# Patient Record
Sex: Male | Born: 1945
Health system: Southern US, Community
[De-identification: ages and names within clinical notes are randomized; demographics above are authoritative.]

## PROBLEM LIST (undated history)

## (undated) DIAGNOSIS — T8859XA Other complications of anesthesia, initial encounter: Secondary | ICD-10-CM

## (undated) DIAGNOSIS — I10 Essential (primary) hypertension: Secondary | ICD-10-CM

## (undated) DIAGNOSIS — M1990 Unspecified osteoarthritis, unspecified site: Secondary | ICD-10-CM

## (undated) DIAGNOSIS — R51 Headache: Secondary | ICD-10-CM

## (undated) DIAGNOSIS — E119 Type 2 diabetes mellitus without complications: Secondary | ICD-10-CM

## (undated) DIAGNOSIS — I4891 Unspecified atrial fibrillation: Secondary | ICD-10-CM

## (undated) DIAGNOSIS — K219 Gastro-esophageal reflux disease without esophagitis: Secondary | ICD-10-CM

## (undated) DIAGNOSIS — E785 Hyperlipidemia, unspecified: Secondary | ICD-10-CM

## (undated) DIAGNOSIS — T4145XA Adverse effect of unspecified anesthetic, initial encounter: Secondary | ICD-10-CM

## (undated) DIAGNOSIS — M5430 Sciatica, unspecified side: Secondary | ICD-10-CM

## (undated) DIAGNOSIS — I2510 Atherosclerotic heart disease of native coronary artery without angina pectoris: Secondary | ICD-10-CM

## (undated) DIAGNOSIS — I219 Acute myocardial infarction, unspecified: Secondary | ICD-10-CM

## (undated) HISTORY — DX: Sciatica, unspecified side: M54.30

## (undated) HISTORY — DX: Gastro-esophageal reflux disease without esophagitis: K21.9

## (undated) HISTORY — PX: TONSILLECTOMY: SUR1361

## (undated) HISTORY — PX: CARDIAC CATHETERIZATION: SHX172

## (undated) HISTORY — PX: APPENDECTOMY: SHX54

## (undated) HISTORY — PX: LIPOMA EXCISION: SHX5283

## (undated) HISTORY — DX: Essential (primary) hypertension: I10

## (undated) HISTORY — PX: BAND HEMORRHOIDECTOMY: SHX1213

## (undated) HISTORY — DX: Type 2 diabetes mellitus without complications: E11.9

## (undated) HISTORY — DX: Hyperlipidemia, unspecified: E78.5

## (undated) HISTORY — PX: KNEE SURGERY: SHX244

## (undated) HISTORY — PX: ADENOIDECTOMY: SUR15

## (undated) HISTORY — DX: Atherosclerotic heart disease of native coronary artery without angina pectoris: I25.10

## (undated) HISTORY — PX: COLONOSCOPY: SHX174

## (undated) HISTORY — PX: ESOPHAGEAL DILATION: SHX303

---

## 2000-08-02 ENCOUNTER — Other Ambulatory Visit: Admission: RE | Admit: 2000-08-02 | Discharge: 2000-08-02 | Payer: Self-pay | Admitting: Otolaryngology

## 2002-05-28 HISTORY — PX: CORONARY ANGIOPLASTY: SHX604

## 2002-08-20 ENCOUNTER — Inpatient Hospital Stay (HOSPITAL_COMMUNITY): Admission: EM | Admit: 2002-08-20 | Discharge: 2002-08-25 | Payer: Self-pay | Admitting: Emergency Medicine

## 2002-08-20 ENCOUNTER — Encounter: Payer: Self-pay | Admitting: Emergency Medicine

## 2002-08-21 ENCOUNTER — Encounter: Payer: Self-pay | Admitting: Cardiology

## 2002-11-18 ENCOUNTER — Encounter: Payer: Self-pay | Admitting: Cardiology

## 2002-11-18 ENCOUNTER — Encounter: Admission: RE | Admit: 2002-11-18 | Discharge: 2002-11-18 | Payer: Self-pay | Admitting: Cardiology

## 2002-11-23 ENCOUNTER — Ambulatory Visit (HOSPITAL_COMMUNITY): Admission: RE | Admit: 2002-11-23 | Discharge: 2002-11-23 | Payer: Self-pay | Admitting: Cardiology

## 2002-11-30 ENCOUNTER — Encounter (HOSPITAL_COMMUNITY): Admission: RE | Admit: 2002-11-30 | Discharge: 2003-02-25 | Payer: Self-pay | Admitting: Cardiology

## 2003-05-26 ENCOUNTER — Ambulatory Visit (HOSPITAL_BASED_OUTPATIENT_CLINIC_OR_DEPARTMENT_OTHER): Admission: RE | Admit: 2003-05-26 | Discharge: 2003-05-26 | Payer: Self-pay | Admitting: Orthopedic Surgery

## 2003-06-21 ENCOUNTER — Encounter: Admission: RE | Admit: 2003-06-21 | Discharge: 2003-06-21 | Payer: Self-pay | Admitting: Gastroenterology

## 2003-06-30 ENCOUNTER — Ambulatory Visit (HOSPITAL_COMMUNITY): Admission: RE | Admit: 2003-06-30 | Discharge: 2003-06-30 | Payer: Self-pay | Admitting: Gastroenterology

## 2010-06-18 ENCOUNTER — Encounter: Payer: Self-pay | Admitting: Physical Medicine and Rehabilitation

## 2012-04-21 ENCOUNTER — Ambulatory Visit (INDEPENDENT_AMBULATORY_CARE_PROVIDER_SITE_OTHER): Payer: BC Managed Care – PPO | Admitting: Surgery

## 2012-05-12 ENCOUNTER — Encounter (INDEPENDENT_AMBULATORY_CARE_PROVIDER_SITE_OTHER): Payer: Self-pay

## 2012-05-12 ENCOUNTER — Ambulatory Visit (INDEPENDENT_AMBULATORY_CARE_PROVIDER_SITE_OTHER): Payer: BC Managed Care – PPO | Admitting: Surgery

## 2012-05-12 ENCOUNTER — Encounter (INDEPENDENT_AMBULATORY_CARE_PROVIDER_SITE_OTHER): Payer: Self-pay | Admitting: Surgery

## 2012-05-12 VITALS — BP 144/84 | HR 80 | Temp 97.5°F | Resp 16 | Ht 67.0 in | Wt 190.4 lb

## 2012-05-12 DIAGNOSIS — K621 Rectal polyp: Secondary | ICD-10-CM

## 2012-05-12 DIAGNOSIS — K620 Anal polyp: Secondary | ICD-10-CM

## 2012-05-12 DIAGNOSIS — K649 Unspecified hemorrhoids: Secondary | ICD-10-CM

## 2012-05-12 NOTE — Progress Notes (Signed)
Patient ID: Ryan Wagner, male   DOB: 1946/04/05, 66 y.o.   MRN: [id number]  No chief complaint on file.   HPI Ryan Wagner is a 66 y.o. male.  Patient sent at request of Dr. [name] due to hemorrhoid bleeding. He has a history of thrombosed external hemorrhoids in his 9s. No history of pain. He does have some seepage and bleeding from time to time. Denies constipation or prolong sitting on the commode. HPI  Past Medical History  Diagnosis Date  . CAD (coronary artery disease)   . Hypertension   . Diabetes mellitus without complication   . GERD (gastroesophageal reflux disease)   . Sciatica   . Hyperlipidemia     Past Surgical History  Procedure Date  . Lipoma excision     neck  . Esophageal dilation   . Tonsilectomy, adenoidectomy, bilateral myringotomy and tubes   . Knee surgery   . Cardiac catheterization     No family history on file.  Social History History  Substance Use Topics  . Smoking status: Never Smoker   . Smokeless tobacco: Not on file  . Alcohol Use: Not on file    No Known Allergies  Current Outpatient Prescriptions  Medication Sig Dispense Refill  . amLODipine-benazepril (LOTREL) 10-20 MG per capsule Take 1 capsule by mouth daily.      . ARGININE PO Take by mouth.      [name] aspirin 81 MG tablet Take 81 mg by mouth daily.      [name] atorvastatin (LIPITOR) 40 MG tablet Take 40 mg by mouth daily.      . Coenzyme Q10 (CO Q 10) 100 MG CAPS Take by mouth daily.      . fish oil-omega-3 fatty acids 1000 MG capsule Take 2 g by mouth daily.      . Liraglutide (VICTOZA) 18 MG/3ML SOLN Inject into the skin.      . metFORMIN (GLUCOPHAGE) 500 MG tablet Take 500 mg by mouth 2 (two) times daily with a meal.      . metoprolol (LOPRESSOR) 50 MG tablet Take 50 mg by mouth 2 (two) times daily.      . Multiple Vitamin (MULTIVITAMIN) tablet Take 1 tablet by mouth daily.      . ranitidine (ZANTAC) 150 MG tablet Take 150 mg by mouth 2 (two) times daily.         Review of Systems Review of Systems  Constitutional: Negative for fever, chills and unexpected weight change.  HENT: Negative for hearing loss, congestion, sore throat, trouble swallowing and voice change.   Eyes: Negative for visual disturbance.  Respiratory: Negative for cough and wheezing.   Cardiovascular: Negative for chest pain, palpitations and leg swelling.  Gastrointestinal: Positive for anal bleeding. Negative for nausea, vomiting, abdominal pain, diarrhea, constipation, blood in stool, abdominal distention and rectal pain.  Genitourinary: Negative for hematuria and difficulty urinating.  Musculoskeletal: Negative for arthralgias.  Skin: Negative for rash and wound.  Neurological: Negative for seizures, syncope, weakness and headaches.  Hematological: Negative for adenopathy. Does not bruise/bleed easily.  Psychiatric/Behavioral: Negative for confusion.    Blood pressure 144/84, pulse 80, temperature 97.5 F (36.4 C), resp. rate 16, height '5\' 7"'$  (1.702 m), weight 190 lb 6.4 oz (86.365 kg).  Physical Exam Physical Exam  Constitutional: He is oriented to person, place, and time. He appears well-developed and well-nourished.  HENT:  Head: Normocephalic and atraumatic.  Eyes: EOM are normal. Pupils are equal, round, and reactive to light.  Neck: Normal range of motion. Neck supple.  Cardiovascular: Normal rate and regular rhythm.   Pulmonary/Chest: Effort normal and breath sounds normal.  Genitourinary:     Musculoskeletal: Normal range of motion.  Neurological: He is alert and oriented to person, place, and time.  Skin: Skin is warm and dry.  Psychiatric: He has a normal mood and affect. His behavior is normal. Judgment and thought content normal.      Assessment    Grade 3      two column internal and external hemorrhoids with large polyp    Plan    Recommended excision as an outpatient due to his large bleeding polyp. Risks, benefits and alternative  therapies discussed. Risk of bleeding, infection, stenosis, pain, incontinence, and the need for other procedures discussed. He wishes to proceed. Questions are answered today.       Evelio Rueda A. 05/12/2012, 10:14 AM

## 2012-05-12 NOTE — Patient Instructions (Signed)
Hemorrhoidectomy Hemorrhoidectomy is surgery to remove hemorrhoids. Hemorrhoids are veins that have become swollen in the rectum. The rectum is the area from the bottom end of the intestines to the opening where bowel movements leave the body. Hemorrhoids can be uncomfortable. They can cause itching, bleeding and pain if a blood clot forms in them (thrombose). If hemorrhoids are small, surgery may not be needed. But if they cover a larger area, surgery is usually suggested.  LET YOUR CAREGIVER KNOW ABOUT:  Any allergies. All medications you are taking, including: Herbs, eyedrops, over-the-counter medications and creams. Blood thinners (anticoagulants), aspirin or other drugs that could affect blood clotting. Use of steroids (by mouth or as creams). Previous problems with anesthetics, including local anesthetics. Possibility of pregnancy, if this applies. Any history of blood clots. Any history of bleeding or other blood problems. Previous surgery. Smoking history. Other health problems. RISKS AND COMPLICATIONS All surgery carries some risk. However, hemorrhoid surgery usually goes smoothly. Possible complications could include: Urinary retention. Bleeding. Infection. A painful incision. A reaction to the anesthesia (this is not common). BEFORE THE PROCEDURE  Stop using aspirin and non-steroidal anti-inflammatory drugs (NSAIDs) for pain relief. This includes prescription drugs and over-the-counter drugs such as ibuprofen and naproxen. Also stop taking vitamin E. If possible, do this two weeks before your surgery. If you take blood-thinners, ask your healthcare provider when you should stop taking them. You will probably have blood and urine tests done several days before your surgery. Do not eat or drink for about 8 hours before the surgery. Arrive at least an hour before the surgery, or whenever your surgeon recommends. This will give you time to check in and fill out any needed  paperwork. Hemorrhoidectomy is often an outpatient procedure. This means you will be able to go home the same day. Sometimes, though, people stay overnight in the hospital after the procedure. Ask your surgeon what to expect. Either way, make arrangements in advance for someone to drive you home. PROCEDURE  The preparation: You will change into a hospital gown. You will be given an IV. A needle will be inserted in your arm. Medication can flow directly into your body through this needle. You might be given an enema to clear your rectum. Once in the operating room, you will probably lie on your side or be repositioned later to lying on your stomach. You will be given anesthesia (medication) so you will not feel anything during the surgery. The surgery often is done with local anesthesia (the area near the hemorrhoids will be numb and you will be drowsy but awake). Sometimes, general anesthesia is used (you will be asleep during the procedure). The procedure: There are a few different procedures for hemorrhoids. Be sure to ask you surgeon about the procedure, the risks and benefits. Be sure to ask about what you need to do to take care of the wound, if there is one. AFTER THE PROCEDURE You will stay in a recovery area until the anesthesia has worn off. Your blood pressure and pulse will be checked every so often. You may feel a lot of pain in the area of the rectum. Take all pain medication prescribed by your surgeon. Ask before taking any over-the-counter pain medicines. Sometimes sitting in a warm bath can help relieve your pain. To make sure you have bowel movements without straining: You will probably need to take stool softeners (usually a pill) for a few days. You should drink 8 to 10 glasses of water each  day. Your activity will be restricted for awhile. Ask your caregiver for a list of what you should and should not do while you recover. Document Released: 03/11/2009 Document Revised:  08/06/2011 Document Reviewed: 03/11/2009 [facility] Patient Information 2013 [place]. Hemorrhoids Hemorrhoids are enlarged (dilated) veins around the rectum. There are 2 types of hemorrhoids, and the type of hemorrhoid is determined by its location. Internal hemorrhoids occur in the veins just inside the rectum.They are usually not painful, but they may bleed.However, they may poke through to the outside and become irritated and painful. External hemorrhoids involve the veins outside the anus and can be felt as a painful swelling or hard lump near the anus.They are often itchy and may crack and bleed. Sometimes clots will form in the veins. This makes them swollen and painful. These are called thrombosed hemorrhoids. CAUSES Causes of hemorrhoids include:  Pregnancy. This increases the pressure in the hemorrhoidal veins.  Constipation.  Straining to have a bowel movement.  Obesity.  Heavy lifting or other activity that caused you to strain. TREATMENT Most of the time hemorrhoids improve in 1 to 2 weeks. However, if symptoms do not seem to be getting better or if you have a lot of rectal bleeding, your caregiver may perform a procedure to help make the hemorrhoids get smaller or remove them completely.Possible treatments include:  Rubber band ligation. A rubber band is placed at the base of the hemorrhoid to cut off the circulation.  Sclerotherapy. A chemical is injected to shrink the hemorrhoid.  Infrared light therapy. Tools are used to burn the hemorrhoid.  Hemorrhoidectomy. This is surgical removal of the hemorrhoid. HOME CARE INSTRUCTIONS   Increase fiber in your diet. Ask your caregiver about using fiber supplements.  Drink enough water and fluids to keep your urine clear or pale yellow.  Exercise regularly.  Go to the bathroom when you have the urge to have a bowel movement. Do not wait.  Avoid straining to have bowel movements.  Keep the anal area dry and  clean.  Only take over-the-counter or prescription medicines for pain, discomfort, or fever as directed by your caregiver. If your hemorrhoids are thrombosed:  Take warm sitz baths for 20 to 30 minutes, 3 to 4 times per day.  If the hemorrhoids are very tender and swollen, place ice packs on the area as tolerated. Using ice packs between sitz baths may be helpful. Fill a plastic bag with ice. Place a towel between the bag of ice and your skin.  Medicated creams and suppositories may be used or applied as directed.  Do not use a donut-shaped pillow or sit on the toilet for long periods. This increases blood pooling and pain. SEEK MEDICAL CARE IF:   You have increasing pain and swelling that is not controlled with your medicine.  You have uncontrolled bleeding.  You have difficulty or you are unable to have a bowel movement.  You have pain or inflammation outside the area of the hemorrhoids.  You have chills or an oral temperature above 102 F (38.9 C). MAKE SURE YOU:   Understand these instructions.  Will watch your condition.  Will get help right away if you are not doing well or get worse. Document Released: 05/11/2000 Document Revised: 08/06/2011 Document Reviewed: 04/24/2010 [facility] Patient Information 2013 [place].

## 2012-05-30 ENCOUNTER — Telehealth (INDEPENDENT_AMBULATORY_CARE_PROVIDER_SITE_OTHER): Payer: Self-pay

## 2012-05-30 NOTE — Telephone Encounter (Signed)
LMOM that Nov labs ok.

## 2012-05-30 NOTE — Telephone Encounter (Signed)
[name] at [facility] called wanting to know if Nov labs will be ok to use for up coming surgery. Nov labs were normal and ok with anesthesia. I advised her I will send request to Dr Luisa Hart and his assistant.  [name] can be reached at [phone] ext [phone]. Can lmom.

## 2012-06-10 ENCOUNTER — Emergency Department (HOSPITAL_BASED_OUTPATIENT_CLINIC_OR_DEPARTMENT_OTHER)
Admission: EM | Admit: 2012-06-10 | Discharge: 2012-06-10 | Disposition: A | Discharge status: Home / Self Care | Payer: BC Managed Care – PPO | Attending: Emergency Medicine | Admitting: Emergency Medicine

## 2012-06-10 ENCOUNTER — Telehealth (INDEPENDENT_AMBULATORY_CARE_PROVIDER_SITE_OTHER): Payer: Self-pay | Admitting: General Surgery

## 2012-06-10 ENCOUNTER — Encounter (HOSPITAL_BASED_OUTPATIENT_CLINIC_OR_DEPARTMENT_OTHER): Payer: Self-pay

## 2012-06-10 ENCOUNTER — Other Ambulatory Visit (INDEPENDENT_AMBULATORY_CARE_PROVIDER_SITE_OTHER): Payer: Self-pay | Admitting: Surgery

## 2012-06-10 DIAGNOSIS — Z8739 Personal history of other diseases of the musculoskeletal system and connective tissue: Secondary | ICD-10-CM | POA: Insufficient documentation

## 2012-06-10 DIAGNOSIS — Z7982 Long term (current) use of aspirin: Secondary | ICD-10-CM | POA: Insufficient documentation

## 2012-06-10 DIAGNOSIS — Z79899 Other long term (current) drug therapy: Secondary | ICD-10-CM | POA: Insufficient documentation

## 2012-06-10 DIAGNOSIS — I10 Essential (primary) hypertension: Secondary | ICD-10-CM | POA: Insufficient documentation

## 2012-06-10 DIAGNOSIS — E785 Hyperlipidemia, unspecified: Secondary | ICD-10-CM | POA: Insufficient documentation

## 2012-06-10 DIAGNOSIS — K219 Gastro-esophageal reflux disease without esophagitis: Secondary | ICD-10-CM | POA: Insufficient documentation

## 2012-06-10 DIAGNOSIS — R339 Retention of urine, unspecified: Secondary | ICD-10-CM | POA: Insufficient documentation

## 2012-06-10 DIAGNOSIS — R338 Other retention of urine: Secondary | ICD-10-CM

## 2012-06-10 DIAGNOSIS — E119 Type 2 diabetes mellitus without complications: Secondary | ICD-10-CM | POA: Insufficient documentation

## 2012-06-10 DIAGNOSIS — I2510 Atherosclerotic heart disease of native coronary artery without angina pectoris: Secondary | ICD-10-CM | POA: Insufficient documentation

## 2012-06-10 DIAGNOSIS — K649 Unspecified hemorrhoids: Secondary | ICD-10-CM

## 2012-06-10 LAB — URINALYSIS, MICROSCOPIC ONLY
Bilirubin Urine: NEGATIVE
Glucose, UA: 250 mg/dL — AB
Hgb urine dipstick: NEGATIVE
Ketones, ur: NEGATIVE mg/dL
Leukocytes, UA: NEGATIVE
Nitrite: NEGATIVE
Protein, ur: NEGATIVE mg/dL
Specific Gravity, Urine: 1.013 (ref 1.005–1.030)
Urobilinogen, UA: 0.2 mg/dL (ref 0.0–1.0)
pH: 6 (ref 5.0–8.0)

## 2012-06-10 NOTE — Discharge Instructions (Signed)
Acute Urinary Retention, Male You have been seen by a caregiver today because of your inability to urinate (pass your water). This is a common problem in elderly males. As men age their prostates become larger and block the flow of urine from the bladder. This is usually a problem that has come on gradually. It is often first noticed by having to get up at night to urinate. This is because as the prostate enlarges it is more difficult to empty the bladder completely. Treatment may involve a one time catheterization to empty the bladder. This is putting in a tube to drain your urine. Then you and your personal caregiver can decide at your earliest convenience how to handle this problem in the future. It may also be a problem that may not recur for years. Sometimes this problem can be caused by medications. In this case, all that is often necessary is to discontinue the offending agent. If you are to leave the foley catheter (a long, narrow, hollow tube) in and go home with a drainage system, you will need to discuss the best course of action with your caregiver. While the catheter is in, maintain a good intake of fluids. Keep the drainage bag emptied and lower than your catheter. This is so contaminated (infected) urine will not be flowing back into your bladder. This could lead to a urinary tract infection. Only take over-the-counter or prescription medicines for pain, discomfort, or fever as directed by your caregiver.  SEEK IMMEDIATE MEDICAL CARE IF:  You develop chills, fever, or show signs of generalized illness that occurs prior to seeing your caregiver. Document Released: 08/20/2000 Document Revised: 08/06/2011 Document Reviewed: 05/05/2008 [facility] Patient Information 2013 [place].    See Dr. Brantley Stage in 3 days at his office for removal of the urinary catheter and a trial of voiding.

## 2012-06-10 NOTE — ED Notes (Signed)
Pt had hemorrhoidectomy this am, d/c'd from [facility] and has not been able to void.

## 2012-06-10 NOTE — Telephone Encounter (Signed)
Wife called to alert Dr. Luisa Hart he cannot void; had hem surgery today.  Did not void at hospital before discharged.  Per VO Dr. Luisa Hart, pt to go to ER for foley catheter.  Will remove it Friday in the office.  Wife understands and will go to [facility] for convenience.

## 2012-06-10 NOTE — ED Provider Notes (Signed)
History     CSN: [id number]  Arrival date & time 06/10/12  1556   First MD Initiated Contact with Patient 06/10/12 1608      Chief Complaint  Patient presents with  . Urinary Retention    (Consider location/radiation/quality/duration/timing/severity/associated sxs/prior treatment) Patient is a 67 y.o. male presenting with male genitourinary complaint. The history is provided by the patient and medical records. No language interpreter was used.  Male GU Problem This is a new (Patient had hemorrhoidectomy this morning, and since then has been unable to urinate.) problem. The problem occurs constantly. The problem has been gradually worsening. There has been no fever. He has tried nothing for the symptoms. Associated medical issues comments: Had hemorrhoidectomy this morning..    Past Medical History  Diagnosis Date  . CAD (coronary artery disease)   . Hypertension   . Diabetes mellitus without complication   . GERD (gastroesophageal reflux disease)   . Sciatica   . Hyperlipidemia     Past Surgical History  Procedure Date  . Lipoma excision     neck  . Esophageal dilation   . Tonsilectomy, adenoidectomy, bilateral myringotomy and tubes   . Knee surgery   . Cardiac catheterization     No family history on file.  History  Substance Use Topics  . Smoking status: Never Smoker   . Smokeless tobacco: Not on file  . Alcohol Use: Yes     Comment: occasional      Review of Systems  Constitutional: Negative for fever and chills.  HENT: Negative.   Eyes: Negative.   Respiratory: Negative.   Cardiovascular: Negative.   Gastrointestinal: Negative.        Had hemorrhoidectomy today.  Genitourinary: Positive for decreased urine volume.       Acute urinary retention  Neurological: Negative.   Psychiatric/Behavioral: Negative.     Allergies  Review of patient's allergies indicates no known allergies.  Home Medications   Current Outpatient Rx  Name  Route  Sig   Dispense  Refill  . AMLODIPINE BESY-BENAZEPRIL HCL 10-20 MG PO CAPS   Oral   Take 1 capsule by mouth daily.         . ARGININE PO   Oral   Take by mouth.         . ASPIRIN 81 MG PO TABS   Oral   Take 81 mg by mouth daily.         . ATORVASTATIN CALCIUM 40 MG PO TABS   Oral   Take 40 mg by mouth daily.         . CO Q 10 100 MG PO CAPS   Oral   Take by mouth daily.         . OMEGA-3 FATTY ACIDS 1000 MG PO CAPS   Oral   Take 2 g by mouth daily.         [name] LIRAGLUTIDE 18 MG/3ML [place] SOLN   Subcutaneous   Inject into the skin.         [name] METFORMIN HCL 500 MG PO TABS   Oral   Take 500 mg by mouth 2 (two) times daily with a meal.         . METOPROLOL TARTRATE 50 MG PO TABS   Oral   Take 50 mg by mouth 2 (two) times daily.         [name] ONE-DAILY MULTI VITAMINS PO TABS   Oral   Take 1 tablet by mouth daily.         [name]  RANITIDINE HCL 150 MG PO TABS   Oral   Take 150 mg by mouth 2 (two) times daily.           BP 116/69  Pulse 66  Temp 99.1 F (37.3 C) (Oral)  Resp 18  Ht 5\' 7"  (1.702 m)  Wt 189 lb (85.73 kg)  BMI 29.60 kg/m2  SpO2 99%  Physical Exam  Nursing note and vitals reviewed. Constitutional: He is oriented to person, place, and time. He appears well-developed and well-nourished.       In moderate distress with urinary retention.  Abdominal: Soft. Distention: he has moderateurinary bladder distention.  Genitourinary: Penis normal.  Musculoskeletal: Normal range of motion. He exhibits no edema and no tenderness.  Neurological: He is alert and oriented to person, place, and time.       No sensory or motor deficit.  Skin: Skin is warm and dry.  Psychiatric: He has a normal mood and affect. His behavior is normal.    ED Course  Procedures (including critical care time)  4:18 PM Pt was seen and had limited exam, which showed bladder distention.  Foley catheter inserted by RN.  Pt to use leg bag at home.  He should see his surgeon, Harriette Bouillon, M.D. In 3 days for catheter removal.    1. Acute urinary retention         Carleene Cooper III, MD 06/10/12 [phone]

## 2012-06-13 ENCOUNTER — Encounter (INDEPENDENT_AMBULATORY_CARE_PROVIDER_SITE_OTHER): Payer: Self-pay | Admitting: Surgery

## 2012-06-13 ENCOUNTER — Ambulatory Visit (INDEPENDENT_AMBULATORY_CARE_PROVIDER_SITE_OTHER): Payer: BC Managed Care – PPO | Admitting: Surgery

## 2012-06-13 VITALS — BP 118/68 | HR 58 | Temp 97.5°F | Resp 16 | Ht 67.0 in | Wt 191.2 lb

## 2012-06-13 DIAGNOSIS — Z9889 Other specified postprocedural states: Secondary | ICD-10-CM

## 2012-06-13 MED ORDER — OXYCODONE-ACETAMINOPHEN 5-325 MG PO TABS: 2.0000 | ORAL_TABLET | ORAL | Status: DC | PRN

## 2012-06-13 NOTE — Progress Notes (Signed)
Patient returns 3 days after hemorrhoidectomy. He had some urinary retention a Foley catheter was placed. Otherwise he is sore but doing okay.  Foley catheter removed today without difficulty. Anal canal is for healing.  Impression: Status post hemorrhoidectomy with urinary retention resolved  Plan: Encouraged to drink plain water. If he has any more urinary issues he'll need to see urologist. Followup in 2 weeks. Pain medicine refilled.

## 2012-06-13 NOTE — Patient Instructions (Signed)
Continue present care

## 2012-07-04 ENCOUNTER — Ambulatory Visit (INDEPENDENT_AMBULATORY_CARE_PROVIDER_SITE_OTHER): Payer: BC Managed Care – PPO | Admitting: Surgery

## 2012-07-04 ENCOUNTER — Encounter (INDEPENDENT_AMBULATORY_CARE_PROVIDER_SITE_OTHER): Payer: Self-pay | Admitting: Surgery

## 2012-07-04 VITALS — BP 118/78 | HR 55 | Temp 98.0°F | Resp 18 | Ht 67.0 in | Wt 189.8 lb

## 2012-07-04 DIAGNOSIS — Z9889 Other specified postprocedural states: Secondary | ICD-10-CM

## 2012-07-04 NOTE — Progress Notes (Signed)
Patient returns 3 WEEKS after hemorrhoidectomy. He had some urinary retention a Foley catheter was placed. Otherwise he is sore but doing okay.  Anal canal clean dry intact without signs of infection  Impression: Status post hemorrhoidectomy with urinary retention resolved  Plan: Encouraged to drink plain water. If he has any more urinary issues he'll need to see urologist. Followup prn.

## 2012-07-04 NOTE — Patient Instructions (Signed)
High-Fiber Diet Fiber is found in fruits, vegetables, and grains. A high-fiber diet encourages the addition of more whole grains, legumes, fruits, and vegetables in your diet. The recommended amount of fiber for adult males is 38 g per day. For adult females, it is 25 g per day. Pregnant and lactating women should get 28 g of fiber per day. If you have a digestive or bowel problem, ask your caregiver for advice before adding high-fiber foods to your diet. Eat a variety of high-fiber foods instead of only a select few type of foods.  PURPOSE  To increase stool bulk.  To make bowel movements more regular to prevent constipation.  To lower cholesterol.  To prevent overeating. WHEN IS THIS DIET USED?  It may be used if you have constipation and hemorrhoids.  It may be used if you have uncomplicated diverticulosis (intestine condition) and irritable bowel syndrome.  It may be used if you need help with weight management.  It may be used if you want to add it to your diet as a protective measure against atherosclerosis, diabetes, and cancer. SOURCES OF FIBER  Whole-grain breads and cereals.  Fruits, such as apples, oranges, bananas, berries, prunes, and pears.  Vegetables, such as green peas, carrots, sweet potatoes, beets, broccoli, cabbage, spinach, and artichokes.  Legumes, such split peas, soy, lentils.  Almonds. FIBER CONTENT IN FOODS Starches and Grains / Dietary Fiber (g)  Cheerios, 1 cup / 3 g  Corn Flakes cereal, 1 cup / 0.7 g  Rice crispy treat cereal, 1 cup / 0.3 g  Instant oatmeal (cooked),  cup / 2 g  Frosted wheat cereal, 1 cup / 5.1 g  Brown, long-grain rice (cooked), 1 cup / 3.5 g  White, long-grain rice (cooked), 1 cup / 0.6 g  Enriched macaroni (cooked), 1 cup / 2.5 g Legumes / Dietary Fiber (g)  Baked beans (canned, plain, or vegetarian),  cup / 5.2 g  Kidney beans (canned),  cup / 6.8 g  Pinto beans (cooked),  cup / 5.5 g Breads and Crackers  / Dietary Fiber (g)  Plain or honey graham crackers, 2 squares / 0.7 g  Saltine crackers, 3 squares / 0.3 g  Plain, salted pretzels, 10 pieces / 1.8 g  Whole-wheat bread, 1 slice / 1.9 g  White bread, 1 slice / 0.7 g  Raisin bread, 1 slice / 1.2 g  Plain bagel, 3 oz / 2 g  Flour tortilla, 1 oz / 0.9 g  Corn tortilla, 1 small / 1.5 g  Hamburger or hotdog bun, 1 small / 0.9 g Fruits / Dietary Fiber (g)  Apple with skin, 1 medium / 4.4 g  Sweetened applesauce,  cup / 1.5 g  Banana,  medium / 1.5 g  Grapes, 10 grapes / 0.4 g  Orange, 1 small / 2.3 g  Raisin, 1.5 oz / 1.6 g  Melon, 1 cup / 1.4 g Vegetables / Dietary Fiber (g)  Green beans (canned),  cup / 1.3 g  Carrots (cooked),  cup / 2.3 g  Broccoli (cooked),  cup / 2.8 g  Peas (cooked),  cup / 4.4 g  Mashed potatoes,  cup / 1.6 g  Lettuce, 1 cup / 0.5 g  Corn (canned),  cup / 1.6 g  Tomato,  cup / 1.1 g Document Released: 05/14/2005 Document Revised: 11/13/2011 Document Reviewed: 08/16/2011 [employer] Patient Information 2013 [employer].  

## 2014-05-19 ENCOUNTER — Encounter (HOSPITAL_COMMUNITY)
Admission: RE | Admit: 2014-05-19 | Discharge: 2014-05-19 | Disposition: A | Discharge status: Home / Self Care | Payer: BC Managed Care – PPO | Source: Ambulatory Visit | Attending: Orthopaedic Surgery | Admitting: Orthopaedic Surgery

## 2014-05-19 ENCOUNTER — Encounter (HOSPITAL_COMMUNITY): Payer: Self-pay

## 2014-05-19 ENCOUNTER — Other Ambulatory Visit (HOSPITAL_COMMUNITY): Payer: Self-pay | Admitting: *Deleted

## 2014-05-19 ENCOUNTER — Other Ambulatory Visit: Payer: Self-pay | Admitting: Orthopaedic Surgery

## 2014-05-19 DIAGNOSIS — Z01812 Encounter for preprocedural laboratory examination: Secondary | ICD-10-CM | POA: Diagnosis present

## 2014-05-19 HISTORY — DX: Unspecified osteoarthritis, unspecified site: M19.90

## 2014-05-19 HISTORY — DX: Other complications of anesthesia, initial encounter: T88.59XA

## 2014-05-19 HISTORY — DX: Headache: R51

## 2014-05-19 HISTORY — DX: Adverse effect of unspecified anesthetic, initial encounter: T41.45XA

## 2014-05-19 LAB — CBC
HCT: 45.7 % (ref 39.0–52.0)
Hemoglobin: 15.6 g/dL (ref 13.0–17.0)
MCH: 29.3 pg (ref 26.0–34.0)
MCHC: 34.1 g/dL (ref 30.0–36.0)
MCV: 85.9 fL (ref 78.0–100.0)
Platelets: 209 10*3/uL (ref 150–400)
RBC: 5.32 MIL/uL (ref 4.22–5.81)
RDW: 13.7 % (ref 11.5–15.5)
WBC: 6.9 10*3/uL (ref 4.0–10.5)

## 2014-05-19 LAB — BASIC METABOLIC PANEL
Anion gap: 10 (ref 5–15)
BUN: 11 mg/dL (ref 6–23)
CO2: 25 mmol/L (ref 19–32)
Calcium: 9.1 mg/dL (ref 8.4–10.5)
Chloride: 106 mEq/L (ref 96–112)
Creatinine, Ser: 0.91 mg/dL (ref 0.50–1.35)
GFR calc Af Amer: >90 mL/min (ref >90)
GFR calc non Af Amer: 86 mL/min — ABNORMAL LOW (ref >90)
Glucose, Bld: 112 mg/dL — ABNORMAL HIGH (ref 70–99)
Potassium: 4.4 mmol/L (ref 3.5–5.1)
Sodium: 141 mmol/L (ref 135–145)

## 2014-05-19 LAB — SURGICAL PCR SCREEN
MRSA, PCR: NEGATIVE
Staphylococcus aureus: NEGATIVE

## 2014-05-19 LAB — PROTIME-INR
INR: 1.06 (ref 0.00–1.49)
Prothrombin Time: 13.9 seconds (ref 11.6–15.2)

## 2014-05-19 LAB — APTT: aPTT: 28 seconds (ref 24–37)

## 2014-05-19 NOTE — Progress Notes (Signed)
Pt had 3 stents placed in 2004. Pt is no longer followed by a cardiologist. His PCP, Dr. Azalia Bilis with [facility] follows him now. He denies any chest pain or sob "for many years". Had full physical in January, had EKG done then (requesting office to fax this).  Stop Bang Assessment sent to PCP. Pt requested that T&S be done day of surgery b/c he is going to the beach for the holidays.

## 2014-05-19 NOTE — Progress Notes (Signed)
   05/19/14 1043  OBSTRUCTIVE SLEEP APNEA  Have you ever been diagnosed with sleep apnea through a sleep study? No  Do you snore loudly (loud enough to be heard through closed doors)?  0  Do you often feel tired, fatigued, or sleepy during the daytime? 0  Has anyone observed you stop breathing during your sleep? 0  Do you have, or are you being treated for high blood pressure? 1  BMI more than 35 kg/m2? 0  Age over 66 years old? 1  Neck circumference greater than 40 cm/16 inches? 1  Gender: 1  Obstructive Sleep Apnea Score 4  Score 4 or greater  Results sent to PCP

## 2014-05-19 NOTE — Progress Notes (Signed)
No pre-op orders in EPIC. Called Dr. Jerald Kief office and left message on [employer].

## 2014-05-19 NOTE — Pre-Procedure Instructions (Signed)
Ryan Wagner  05/19/2014   Your procedure is scheduled on:  Tuesday, June 01, 2014 at 10:30 AM.   Report to [facility] Entrance "A" Admitting Office at 8:30 AM.   Call this number if you have problems the morning of surgery: [phone]                Any questions prior to day of surgery, please call [phone] between 8 & 4 PM.   Remember:   Do not eat food or drink liquids after midnight Monday, 05/31/14   Take these medicines the morning of surgery with A SIP OF WATER: metoprolol (LOPRESSOR)  Stop Aspirin, Fish Oil, Vitamins and Herbal Medications 7 days prior to surgery.   Do not wear jewelry.  Do not wear lotions, powders, or cologne. You may wear deodorant.  Men may shave face and neck.  Do not bring valuables to the hospital.  [facility] is not responsible                  for any belongings or valuables.               Contacts, dentures or bridgework may not be worn into surgery.  Leave suitcase in the car. After surgery it may be brought to your room.  For patients admitted to the hospital, discharge time is determined by your                treatment team.              Special Instructions: [place] - Preparing for Surgery  Before surgery, you can play an important role.  Because skin is not sterile, your skin needs to be as free of germs as possible.  You can reduce the number of germs on you skin by washing with CHG (chlorahexidine gluconate) soap before surgery.  CHG is an antiseptic cleaner which kills germs and bonds with the skin to continue killing germs even after washing.  Please DO NOT use if you have an allergy to CHG or antibacterial soaps.  If your skin becomes reddened/irritated stop using the CHG and inform your nurse when you arrive at Short Stay.  Do not shave (including legs and underarms) for at least 48 hours prior to the first CHG shower.  You may shave your face.  Please follow these instructions carefully:   1.  Shower with  CHG Soap the night before surgery and the                                morning of Surgery.  2.  If you choose to wash your hair, wash your hair first as usual with your       normal shampoo.  3.  After you shampoo, rinse your hair and body thoroughly to remove the                      Shampoo.  4.  Use CHG as you would any other liquid soap.  You can apply chg directly       to the skin and wash gently with scrungie or a clean washcloth.  5.  Apply the CHG Soap to your body ONLY FROM THE NECK DOWN.        Do not use on open wounds or open sores.  Avoid contact with your eyes, ears, mouth and genitals (private parts).  Wash genitals (private parts) with your normal soap.  6.  Wash thoroughly, paying special attention to the area where your surgery        will be performed.  7.  Thoroughly rinse your body with warm water from the neck down.  8.  DO NOT shower/wash with your normal soap after using and rinsing off       the CHG Soap.  9.  Pat yourself dry with a clean towel.            10.  Wear clean pajamas.            11.  Place clean sheets on your bed the night of your first shower and do not        sleep with pets.  Day of Surgery  Do not apply any lotions the morning of surgery.  Please wear clean clothes to the hospital.     Please read over the following fact sheets that you were given: Pain Booklet, Coughing and Deep Breathing, Blood Transfusion Information, MRSA Information and Surgical Site Infection Prevention

## 2014-05-27 NOTE — H&P (Signed)
TOTAL KNEE ADMISSION H&P  Patient is being admitted for left total knee arthroplasty.  Subjective:  Chief Complaint:left knee pain.  HPI: Ryan Wagner, 68 y.o. male, has a history of pain and functional disability in the left knee due to arthritis and has failed non-surgical conservative treatments for greater than 12 weeks to includeNSAID's and/or analgesics, corticosteriod injections, viscosupplementation injections, flexibility and strengthening excercises, weight reduction as appropriate and activity modification.  Onset of symptoms was gradual, starting 5 years ago with gradually worsening course since that time. The patient noted no past surgery on the left knee(s).  Patient currently rates pain in the left knee(s) at 10 out of 10 with activity. Patient has night pain, worsening of pain with activity and weight bearing, pain that interferes with activities of daily living, crepitus and joint swelling.  Patient has evidence of subchondral cysts, subchondral sclerosis, periarticular osteophytes and joint space narrowing by imaging studies. There is no active infection.  There are no active problems to display for this patient.  Past Medical History  Diagnosis Date  . Hypertension   . Hyperlipidemia   . CAD (coronary artery disease)     stents x 3 in 2004  . Diabetes mellitus without complication     type 2  . GERD (gastroesophageal reflux disease)     no longer with symptoms  . Headache     as a child  . Sciatica     improved  . Arthritis     knees, hands  . Complication of anesthesia     had difficulty voiding after hemorrhoidectomy    Past Surgical History  Procedure Laterality Date  . Lipoma excision      neck  . Esophageal dilation    . Knee surgery Left   . Cardiac catheterization    . Tonsillectomy    . Adenoidectomy    . Colonoscopy    . Coronary angioplasty  2004  . Appendectomy      No prescriptions prior to admission   No Known Allergies  History   Substance Use Topics  . Smoking status: Never Smoker   . Smokeless tobacco: Never Used  . Alcohol Use: Yes     Comment: occasional    Family History  Problem Relation Age of Onset  . Dementia Mother   . Thyroid disease Mother   . Alzheimer's disease Father      Review of Systems  Musculoskeletal: Positive for joint pain.       Left knee  All other systems reviewed and are negative.   Objective:  Physical Exam  Constitutional: He is oriented to person, place, and time. He appears well-developed and well-nourished.  HENT:  Head: Normocephalic and atraumatic.  Eyes: Conjunctivae are normal. Pupils are equal, round, and reactive to light.  Neck: Normal range of motion.  Cardiovascular: Normal rate and regular rhythm.   Respiratory: Effort normal.  GI: Soft.  Musculoskeletal:  Left knee has healed portals and no effusion. His motion remains 0-120. He has pain along the medial joint line. There is a mild varus deformity.  Hip motion is full and pain free and SLR is negative on both sides.  There is no palpable LAD behind either knee.  Sensation and motor function are intact on both sides and there are palpable pulses on both sides.    Neurological: He is alert and oriented to person, place, and time.  Skin: Skin is warm and dry.  Psychiatric: He has a normal mood and affect.  His behavior is normal. Judgment and thought content normal.    Vital signs in last 24 hours:    Labs:   Estimated body mass index is 29.72 kg/(m^2) as calculated from the following:   Height as of 07/04/12: 5\' 7"  (1.702 m).   Weight as of 07/04/12: 86.093 kg (189 lb 12.8 oz).   Imaging Review Plain radiographs demonstrate severe degenerative joint disease of the left knee(s). The overall alignment isneutral. The bone quality appears to be good for age and reported activity level.  Assessment/Plan:  End stage primary arthritis, left knee   The patient history, physical examination, clinical  judgment of the provider and imaging studies are consistent with end stage degenerative joint disease of the left knee(s) and total knee arthroplasty is deemed medically necessary. The treatment options including medical management, injection therapy arthroscopy and arthroplasty were discussed at length. The risks and benefits of total knee arthroplasty were presented and reviewed. The risks due to aseptic loosening, infection, stiffness, patella tracking problems, thromboembolic complications and other imponderables were discussed. The patient acknowledged the explanation, agreed to proceed with the plan and consent was signed. Patient is being admitted for inpatient treatment for surgery, pain control, PT, OT, prophylactic antibiotics, VTE prophylaxis, progressive ambulation and ADL's and discharge planning. The patient is planning to be discharged home with home health services

## 2014-06-01 ENCOUNTER — Encounter (HOSPITAL_COMMUNITY)
Admission: RE | Disposition: A | Discharge status: Home / Self Care | Payer: Self-pay | Source: Ambulatory Visit | Attending: Orthopaedic Surgery

## 2014-06-01 ENCOUNTER — Inpatient Hospital Stay (HOSPITAL_COMMUNITY): Payer: PPO

## 2014-06-01 ENCOUNTER — Encounter (HOSPITAL_COMMUNITY): Payer: Self-pay | Admitting: Certified Registered"

## 2014-06-01 ENCOUNTER — Inpatient Hospital Stay (HOSPITAL_COMMUNITY): Payer: PPO | Admitting: Certified Registered"

## 2014-06-01 ENCOUNTER — Inpatient Hospital Stay (HOSPITAL_COMMUNITY)
Admission: RE | Admit: 2014-06-01 | Discharge: 2014-06-03 | DRG: 470 | Disposition: A | Discharge status: Home / Self Care | Payer: PPO | Source: Ambulatory Visit | Attending: Orthopaedic Surgery | Admitting: Orthopaedic Surgery

## 2014-06-01 DIAGNOSIS — M2578 Osteophyte, vertebrae: Secondary | ICD-10-CM | POA: Diagnosis present

## 2014-06-01 DIAGNOSIS — K219 Gastro-esophageal reflux disease without esophagitis: Secondary | ICD-10-CM | POA: Diagnosis present

## 2014-06-01 DIAGNOSIS — I2510 Atherosclerotic heart disease of native coronary artery without angina pectoris: Secondary | ICD-10-CM | POA: Diagnosis present

## 2014-06-01 DIAGNOSIS — I10 Essential (primary) hypertension: Secondary | ICD-10-CM | POA: Diagnosis present

## 2014-06-01 DIAGNOSIS — M1712 Unilateral primary osteoarthritis, left knee: Secondary | ICD-10-CM | POA: Diagnosis present

## 2014-06-01 DIAGNOSIS — Z01818 Encounter for other preprocedural examination: Secondary | ICD-10-CM

## 2014-06-01 DIAGNOSIS — E119 Type 2 diabetes mellitus without complications: Secondary | ICD-10-CM | POA: Diagnosis present

## 2014-06-01 DIAGNOSIS — M5430 Sciatica, unspecified side: Secondary | ICD-10-CM | POA: Diagnosis present

## 2014-06-01 DIAGNOSIS — M25562 Pain in left knee: Secondary | ICD-10-CM | POA: Diagnosis present

## 2014-06-01 DIAGNOSIS — Z9861 Coronary angioplasty status: Secondary | ICD-10-CM

## 2014-06-01 DIAGNOSIS — M1710 Unilateral primary osteoarthritis, unspecified knee: Secondary | ICD-10-CM | POA: Diagnosis present

## 2014-06-01 DIAGNOSIS — Z9049 Acquired absence of other specified parts of digestive tract: Secondary | ICD-10-CM | POA: Diagnosis present

## 2014-06-01 DIAGNOSIS — E785 Hyperlipidemia, unspecified: Secondary | ICD-10-CM | POA: Diagnosis present

## 2014-06-01 HISTORY — PX: TOTAL KNEE ARTHROPLASTY: SHX125

## 2014-06-01 LAB — TYPE AND SCREEN
ABO/RH(D): A POS
Antibody Screen: NEGATIVE

## 2014-06-01 LAB — GLUCOSE, CAPILLARY
Glucose-Capillary: 127 mg/dL — ABNORMAL HIGH (ref 70–99)
Glucose-Capillary: 112 mg/dL — ABNORMAL HIGH (ref 70–99)
Glucose-Capillary: 144 mg/dL — ABNORMAL HIGH (ref 70–99)
Glucose-Capillary: 159 mg/dL — ABNORMAL HIGH (ref 70–99)

## 2014-06-01 LAB — ABO/RH: ABO/RH(D): A POS

## 2014-06-01 SURGERY — ARTHROPLASTY, KNEE, TOTAL
Anesthesia: Monitor Anesthesia Care | Site: Knee | Laterality: Left

## 2014-06-01 MED ORDER — FENTANYL CITRATE 0.05 MG/ML IJ SOLN
50.0000 ug | INTRAMUSCULAR | Status: DC | PRN
  Administered 2014-06-01: 50 ug via INTRAVENOUS

## 2014-06-01 MED ORDER — CHLORHEXIDINE GLUCONATE 4 % EX LIQD
60.0000 mL | Freq: Once | CUTANEOUS | Status: DC
  Filled 2014-06-01: qty 60

## 2014-06-01 MED ORDER — AMLODIPINE BESY-BENAZEPRIL HCL 10-20 MG PO CAPS: 1.0000 | ORAL_CAPSULE | Freq: Every day | ORAL | Status: DC

## 2014-06-01 MED ORDER — ONDANSETRON HCL 4 MG/2ML IJ SOLN: 4.0000 mg | Freq: Four times a day (QID) | INTRAMUSCULAR | Status: DC | PRN

## 2014-06-01 MED ORDER — HYDROCODONE-ACETAMINOPHEN 5-325 MG PO TABS
1.0000 | ORAL_TABLET | ORAL | Status: DC | PRN
  Administered 2014-06-01 – 2014-06-02 (×3): 2 via ORAL
  Filled 2014-06-01 (×3): qty 2

## 2014-06-01 MED ORDER — PROPOFOL 10 MG/ML IV BOLUS
INTRAVENOUS | Status: AC
  Filled 2014-06-01: qty 20

## 2014-06-01 MED ORDER — METHOCARBAMOL 1000 MG/10ML IJ SOLN
500.0000 mg | Freq: Four times a day (QID) | INTRAVENOUS | Status: DC | PRN
  Filled 2014-06-01: qty 5

## 2014-06-01 MED ORDER — METOPROLOL TARTRATE 50 MG PO TABS
50.0000 mg | ORAL_TABLET | Freq: Two times a day (BID) | ORAL | Status: DC
  Administered 2014-06-01 – 2014-06-03 (×4): 50 mg via ORAL
  Filled 2014-06-01 (×5): qty 1

## 2014-06-01 MED ORDER — PROPOFOL INFUSION 10 MG/ML OPTIME
INTRAVENOUS | Status: DC | PRN
  Administered 2014-06-01: 75 ug/kg/min via INTRAVENOUS

## 2014-06-01 MED ORDER — MIDAZOLAM HCL 5 MG/5ML IJ SOLN
INTRAMUSCULAR | Status: DC | PRN
  Administered 2014-06-01: 2 mg via INTRAVENOUS

## 2014-06-01 MED ORDER — HYDROMORPHONE HCL 1 MG/ML IJ SOLN
INTRAMUSCULAR | Status: AC
  Administered 2014-06-01: 0.5 mg via INTRAVENOUS
  Filled 2014-06-01: qty 1

## 2014-06-01 MED ORDER — INSULIN ASPART 100 UNIT/ML ~~LOC~~ SOLN
0.0000 [IU] | Freq: Three times a day (TID) | SUBCUTANEOUS | Status: DC
  Administered 2014-06-02: 3 [IU] via SUBCUTANEOUS

## 2014-06-01 MED ORDER — FENTANYL CITRATE 0.05 MG/ML IJ SOLN
INTRAMUSCULAR | Status: AC
  Administered 2014-06-01: 50 ug via INTRAVENOUS
  Filled 2014-06-01: qty 2

## 2014-06-01 MED ORDER — LACTATED RINGERS IV SOLN
INTRAVENOUS | Status: DC
  Administered 2014-06-01 – 2014-06-02 (×2): via INTRAVENOUS

## 2014-06-01 MED ORDER — ONDANSETRON HCL 4 MG/2ML IJ SOLN
INTRAMUSCULAR | Status: AC
  Filled 2014-06-01: qty 2

## 2014-06-01 MED ORDER — LIRAGLUTIDE 18 MG/3ML ~~LOC~~ SOLN: 1.2000 mg | Freq: Every day | SUBCUTANEOUS | Status: DC

## 2014-06-01 MED ORDER — CEFAZOLIN SODIUM-DEXTROSE 2-3 GM-% IV SOLR
2.0000 g | Freq: Four times a day (QID) | INTRAVENOUS | Status: AC
  Administered 2014-06-01 – 2014-06-02 (×2): 2 g via INTRAVENOUS
  Filled 2014-06-01 (×2): qty 50

## 2014-06-01 MED ORDER — SODIUM CHLORIDE 0.9 % IV SOLN
INTRAVENOUS | Status: DC | PRN
  Administered 2014-06-01: 40 mL

## 2014-06-01 MED ORDER — METHOCARBAMOL 500 MG PO TABS
ORAL_TABLET | ORAL | Status: AC
  Filled 2014-06-01: qty 1

## 2014-06-01 MED ORDER — ASPIRIN EC 325 MG PO TBEC
325.0000 mg | DELAYED_RELEASE_TABLET | Freq: Two times a day (BID) | ORAL | Status: DC
  Administered 2014-06-02 – 2014-06-03 (×3): 325 mg via ORAL
  Filled 2014-06-01 (×5): qty 1

## 2014-06-01 MED ORDER — DOCUSATE SODIUM 100 MG PO CAPS
100.0000 mg | ORAL_CAPSULE | Freq: Two times a day (BID) | ORAL | Status: DC
  Administered 2014-06-01 – 2014-06-03 (×4): 100 mg via ORAL
  Filled 2014-06-01 (×6): qty 1

## 2014-06-01 MED ORDER — BISACODYL 5 MG PO TBEC: 5.0000 mg | DELAYED_RELEASE_TABLET | Freq: Every day | ORAL | Status: DC | PRN

## 2014-06-01 MED ORDER — LACTATED RINGERS IV SOLN
INTRAVENOUS | Status: DC
  Administered 2014-06-01 (×2): via INTRAVENOUS

## 2014-06-01 MED ORDER — METOCLOPRAMIDE HCL 5 MG/ML IJ SOLN: 5.0000 mg | Freq: Three times a day (TID) | INTRAMUSCULAR | Status: DC | PRN

## 2014-06-01 MED ORDER — METOCLOPRAMIDE HCL 10 MG PO TABS: 5.0000 mg | ORAL_TABLET | Freq: Three times a day (TID) | ORAL | Status: DC | PRN

## 2014-06-01 MED ORDER — DIPHENHYDRAMINE HCL 12.5 MG/5ML PO ELIX: 12.5000 mg | ORAL_SOLUTION | ORAL | Status: DC | PRN

## 2014-06-01 MED ORDER — MIDAZOLAM HCL 2 MG/2ML IJ SOLN
1.0000 mg | INTRAMUSCULAR | Status: DC | PRN
  Administered 2014-06-01: 1 mg via INTRAVENOUS

## 2014-06-01 MED ORDER — OXYCODONE HCL 5 MG PO TABS
ORAL_TABLET | ORAL | Status: AC
  Filled 2014-06-01: qty 1

## 2014-06-01 MED ORDER — ONDANSETRON HCL 4 MG/2ML IJ SOLN
INTRAMUSCULAR | Status: DC | PRN
  Administered 2014-06-01: 4 mg via INTRAVENOUS

## 2014-06-01 MED ORDER — TRANEXAMIC ACID 100 MG/ML IV SOLN
2000.0000 mg | INTRAVENOUS | Status: DC | PRN
  Administered 2014-06-01: 2000 mg via TOPICAL

## 2014-06-01 MED ORDER — HYDROMORPHONE HCL 1 MG/ML IJ SOLN
0.2500 mg | INTRAMUSCULAR | Status: DC | PRN
  Administered 2014-06-01 (×4): 0.5 mg via INTRAVENOUS

## 2014-06-01 MED ORDER — AMLODIPINE BESYLATE 10 MG PO TABS
10.0000 mg | ORAL_TABLET | Freq: Every day | ORAL | Status: DC
  Administered 2014-06-02 – 2014-06-03 (×2): 10 mg via ORAL
  Filled 2014-06-01 (×2): qty 1

## 2014-06-01 MED ORDER — ADULT MULTIVITAMIN W/MINERALS CH
1.0000 | ORAL_TABLET | Freq: Every day | ORAL | Status: DC
Start: 2014-06-02 — End: 2014-06-03
  Administered 2014-06-02 – 2014-06-03 (×2): 1 via ORAL
  Filled 2014-06-01 (×2): qty 1

## 2014-06-01 MED ORDER — FAMOTIDINE 20 MG PO TABS
20.0000 mg | ORAL_TABLET | Freq: Every day | ORAL | Status: DC
  Filled 2014-06-01 (×2): qty 1

## 2014-06-01 MED ORDER — SODIUM CHLORIDE 0.9 % IV SOLN
10.0000 mg | INTRAVENOUS | Status: DC | PRN
  Administered 2014-06-01: 20 ug/min via INTRAVENOUS

## 2014-06-01 MED ORDER — HYDROMORPHONE HCL 1 MG/ML IJ SOLN
0.5000 mg | INTRAMUSCULAR | Status: DC | PRN
  Administered 2014-06-01 – 2014-06-02 (×6): 1 mg via INTRAVENOUS
  Filled 2014-06-01 (×6): qty 1

## 2014-06-01 MED ORDER — PHENYLEPHRINE HCL 10 MG/ML IJ SOLN
INTRAMUSCULAR | Status: DC | PRN
  Administered 2014-06-01 (×2): 80 ug via INTRAVENOUS

## 2014-06-01 MED ORDER — SODIUM CHLORIDE 0.9 % IR SOLN
Status: DC | PRN
  Administered 2014-06-01: 3000 mL

## 2014-06-01 MED ORDER — TAMSULOSIN HCL 0.4 MG PO CAPS
0.4000 mg | ORAL_CAPSULE | Freq: Every day | ORAL | Status: DC
Start: 2014-06-02 — End: 2014-06-03
  Administered 2014-06-02: 0.4 mg via ORAL
  Filled 2014-06-01 (×3): qty 1

## 2014-06-01 MED ORDER — OXYCODONE HCL 5 MG PO TABS
5.0000 mg | ORAL_TABLET | Freq: Once | ORAL | Status: AC | PRN
  Administered 2014-06-01: 5 mg via ORAL

## 2014-06-01 MED ORDER — PHENOL 1.4 % MT LIQD: 1.0000 | OROMUCOSAL | Status: DC | PRN

## 2014-06-01 MED ORDER — CEFAZOLIN SODIUM-DEXTROSE 2-3 GM-% IV SOLR
2.0000 g | INTRAVENOUS | Status: AC
  Administered 2014-06-01: 2 g via INTRAVENOUS
  Filled 2014-06-01: qty 50

## 2014-06-01 MED ORDER — MIDAZOLAM HCL 2 MG/2ML IJ SOLN
INTRAMUSCULAR | Status: AC
  Administered 2014-06-01: 1 mg via INTRAVENOUS
  Filled 2014-06-01: qty 2

## 2014-06-01 MED ORDER — FENTANYL CITRATE 0.05 MG/ML IJ SOLN
INTRAMUSCULAR | Status: AC
  Filled 2014-06-01: qty 5

## 2014-06-01 MED ORDER — ATORVASTATIN CALCIUM 40 MG PO TABS
40.0000 mg | ORAL_TABLET | Freq: Every day | ORAL | Status: DC
  Administered 2014-06-01 – 2014-06-03 (×3): 40 mg via ORAL
  Filled 2014-06-01 (×3): qty 1

## 2014-06-01 MED ORDER — BENAZEPRIL HCL 20 MG PO TABS
20.0000 mg | ORAL_TABLET | Freq: Every day | ORAL | Status: DC
  Administered 2014-06-02 – 2014-06-03 (×2): 20 mg via ORAL
  Filled 2014-06-01 (×2): qty 1

## 2014-06-01 MED ORDER — OXYCODONE HCL 5 MG/5ML PO SOLN: 5.0000 mg | Freq: Once | ORAL | Status: AC | PRN

## 2014-06-01 MED ORDER — MIDAZOLAM HCL 2 MG/2ML IJ SOLN
INTRAMUSCULAR | Status: AC
  Filled 2014-06-01: qty 2

## 2014-06-01 MED ORDER — ONDANSETRON HCL 4 MG/2ML IJ SOLN: 4.0000 mg | Freq: Once | INTRAMUSCULAR | Status: DC | PRN

## 2014-06-01 MED ORDER — METFORMIN HCL 500 MG PO TABS
500.0000 mg | ORAL_TABLET | Freq: Two times a day (BID) | ORAL | Status: DC
  Administered 2014-06-02 – 2014-06-03 (×3): 500 mg via ORAL
  Filled 2014-06-01 (×5): qty 1

## 2014-06-01 MED ORDER — METHOCARBAMOL 500 MG PO TABS
500.0000 mg | ORAL_TABLET | Freq: Four times a day (QID) | ORAL | Status: DC | PRN
  Administered 2014-06-01 – 2014-06-03 (×6): 500 mg via ORAL
  Filled 2014-06-01 (×6): qty 1

## 2014-06-01 MED ORDER — BUPIVACAINE LIPOSOME 1.3 % IJ SUSP
20.0000 mL | INTRAMUSCULAR | Status: DC
  Filled 2014-06-01: qty 20

## 2014-06-01 MED ORDER — MENTHOL 3 MG MT LOZG: 1.0000 | LOZENGE | OROMUCOSAL | Status: DC | PRN

## 2014-06-01 MED ORDER — CO Q 10 100 MG PO CAPS: ORAL_CAPSULE | Freq: Every day | ORAL | Status: DC

## 2014-06-01 MED ORDER — SODIUM CHLORIDE 0.9 % IR SOLN
Status: DC | PRN
  Administered 2014-06-01: 1000 mL

## 2014-06-01 MED ORDER — FENTANYL CITRATE 0.05 MG/ML IJ SOLN
INTRAMUSCULAR | Status: DC | PRN
  Administered 2014-06-01: 100 ug via INTRAVENOUS
  Administered 2014-06-01: 50 ug via INTRAVENOUS

## 2014-06-01 MED ORDER — ONDANSETRON HCL 4 MG PO TABS: 4.0000 mg | ORAL_TABLET | Freq: Four times a day (QID) | ORAL | Status: DC | PRN

## 2014-06-01 MED ORDER — ALUM & MAG HYDROXIDE-SIMETH 200-200-20 MG/5ML PO SUSP: 30.0000 mL | ORAL | Status: DC | PRN

## 2014-06-01 MED ORDER — TRANEXAMIC ACID 100 MG/ML IV SOLN
2000.0000 mg | INTRAVENOUS | Status: DC
  Filled 2014-06-01: qty 20

## 2014-06-01 MED ORDER — ACETAMINOPHEN 325 MG PO TABS: 650.0000 mg | ORAL_TABLET | Freq: Four times a day (QID) | ORAL | Status: DC | PRN

## 2014-06-01 MED ORDER — ACETAMINOPHEN 650 MG RE SUPP: 650.0000 mg | Freq: Four times a day (QID) | RECTAL | Status: DC | PRN

## 2014-06-01 SURGICAL SUPPLY — 64 items
BAG DECANTER FOR FLEXI CONT (MISCELLANEOUS) ×2 IMPLANT
BANDAGE ELASTIC 4 VELCRO ST LF (GAUZE/BANDAGES/DRESSINGS) ×2 IMPLANT
BANDAGE ESMARK 6X9 LF (GAUZE/BANDAGES/DRESSINGS) ×1 IMPLANT
BENZOIN TINCTURE PRP APPL 2/3 (GAUZE/BANDAGES/DRESSINGS) ×2 IMPLANT
BLADE SAGITTAL 25.0X1.19X90 (BLADE) IMPLANT
BLADE SAW SGTL 13.0X1.19X90.0M (BLADE) ×4 IMPLANT
BLADE SURG ROTATE 9660 (MISCELLANEOUS) IMPLANT
BNDG ELASTIC 6X10 VLCR STRL LF (GAUZE/BANDAGES/DRESSINGS) ×2 IMPLANT
BNDG ESMARK 6X9 LF (GAUZE/BANDAGES/DRESSINGS) ×2
BNDG GAUZE ELAST 4 BULKY (GAUZE/BANDAGES/DRESSINGS) ×2 IMPLANT
BOWL SMART MIX CTS (DISPOSABLE) ×2 IMPLANT
CAPT KNEE TOTAL 3 ATTUNE ×2 IMPLANT
CEMENT HV SMART SET (Cement) ×4 IMPLANT
COVER SURGICAL LIGHT HANDLE (MISCELLANEOUS) ×2 IMPLANT
CUFF TOURNIQUET SINGLE 34IN LL (TOURNIQUET CUFF) ×2 IMPLANT
CUFF TOURNIQUET SINGLE 44IN (TOURNIQUET CUFF) IMPLANT
DRAPE EXTREMITY T 121X128X90 (DRAPE) ×2 IMPLANT
DRAPE IMP U-DRAPE 54X76 (DRAPES) ×2 IMPLANT
DRAPE PROXIMA HALF (DRAPES) ×2 IMPLANT
DRAPE U-SHAPE 47X51 STRL (DRAPES) ×2 IMPLANT
DRSG ADAPTIC 3X8 NADH LF (GAUZE/BANDAGES/DRESSINGS) ×2 IMPLANT
DRSG PAD ABDOMINAL 8X10 ST (GAUZE/BANDAGES/DRESSINGS) ×4 IMPLANT
DURAPREP 26ML APPLICATOR (WOUND CARE) ×2 IMPLANT
ELECT CAUTERY BLADE 6.4 (BLADE) ×2 IMPLANT
ELECT REM PT RETURN 9FT ADLT (ELECTROSURGICAL) ×2
ELECTRODE REM PT RTRN 9FT ADLT (ELECTROSURGICAL) ×1 IMPLANT
GAUZE SPONGE 4X4 12PLY STRL (GAUZE/BANDAGES/DRESSINGS) ×2 IMPLANT
GLOVE BIO SURGEON STRL SZ8 (GLOVE) ×4 IMPLANT
GLOVE BIOGEL PI IND STRL 8 (GLOVE) ×2 IMPLANT
GLOVE BIOGEL PI INDICATOR 8 (GLOVE) ×2
GOWN STRL REUS W/ TWL LRG LVL3 (GOWN DISPOSABLE) ×1 IMPLANT
GOWN STRL REUS W/ TWL XL LVL3 (GOWN DISPOSABLE) ×2 IMPLANT
GOWN STRL REUS W/TWL LRG LVL3 (GOWN DISPOSABLE) ×2
GOWN STRL REUS W/TWL XL LVL3 (GOWN DISPOSABLE) ×4
HANDPIECE INTERPULSE COAX TIP (DISPOSABLE) ×2
HOOD PEEL AWAY FACE SHEILD DIS (HOOD) ×6 IMPLANT
IMMOBILIZER KNEE 20 (SOFTGOODS) IMPLANT
IMMOBILIZER KNEE 22 UNIV (SOFTGOODS) ×2 IMPLANT
IMMOBILIZER KNEE 24 THIGH 36 (MISCELLANEOUS) IMPLANT
IMMOBILIZER KNEE 24 UNIV (MISCELLANEOUS)
KIT BASIN OR (CUSTOM PROCEDURE TRAY) ×2 IMPLANT
KIT ROOM TURNOVER OR (KITS) ×2 IMPLANT
MANIFOLD NEPTUNE II (INSTRUMENTS) ×2 IMPLANT
NEEDLE HYPO 21X1 ECLIPSE (NEEDLE) ×2 IMPLANT
NS IRRIG 1000ML POUR BTL (IV SOLUTION) ×2 IMPLANT
PACK TOTAL JOINT (CUSTOM PROCEDURE TRAY) ×2 IMPLANT
PACK UNIVERSAL I (CUSTOM PROCEDURE TRAY) ×2 IMPLANT
PAD ARMBOARD 7.5X6 YLW CONV (MISCELLANEOUS) ×4 IMPLANT
SET HNDPC FAN SPRY TIP SCT (DISPOSABLE) ×1 IMPLANT
STAPLER VISISTAT 35W (STAPLE) IMPLANT
STRIP CLOSURE SKIN 1/2X4 (GAUZE/BANDAGES/DRESSINGS) ×2 IMPLANT
SUCTION FRAZIER TIP 10 FR DISP (SUCTIONS) ×2 IMPLANT
SUT MNCRL AB 3-0 PS2 18 (SUTURE) ×2 IMPLANT
SUT VIC AB 0 CT1 27 (SUTURE) ×4
SUT VIC AB 0 CT1 27XBRD ANBCTR (SUTURE) ×2 IMPLANT
SUT VIC AB 2-0 CT1 27 (SUTURE) ×4
SUT VIC AB 2-0 CT1 TAPERPNT 27 (SUTURE) ×2 IMPLANT
SUT VLOC 180 0 24IN GS25 (SUTURE) ×2 IMPLANT
SYR 50ML LL SCALE MARK (SYRINGE) ×2 IMPLANT
TOWEL OR 17X24 6PK STRL BLUE (TOWEL DISPOSABLE) ×2 IMPLANT
TOWEL OR 17X26 10 PK STRL BLUE (TOWEL DISPOSABLE) ×2 IMPLANT
TRAY CATH 16FR W/PLASTIC CATH (SET/KITS/TRAYS/PACK) IMPLANT
TRAY FOLEY CATH 14FR (SET/KITS/TRAYS/PACK) IMPLANT
WATER STERILE IRR 1000ML POUR (IV SOLUTION) IMPLANT

## 2014-06-01 NOTE — Progress Notes (Signed)
Orthopedic Tech Progress Note Patient Details:  [name] [birth year] [id number] Off cpm at 8:10 pm Patient ID: Ryan Wagner, male   DOB: 1945-11-22, 69 y.o.   MRN: [id number]   [name] 06/01/2014, 8:11 PM

## 2014-06-01 NOTE — Progress Notes (Signed)
Care of pt assumed by MA Afra Tricarico RN 

## 2014-06-01 NOTE — Anesthesia Preprocedure Evaluation (Signed)
Anesthesia Evaluation  Patient identified by MRN, date of birth, ID band Patient awake    Reviewed: Allergy & Precautions, NPO status , Patient's Chart, lab work & pertinent test results, reviewed documented beta blocker date and time   Airway Mallampati: II  TM Distance: >3 FB Neck ROM: Full    Dental  (+) Teeth Intact, Dental Advisory Given   Pulmonary  breath sounds clear to auscultation        Cardiovascular hypertension, Rhythm:Regular Rate:Normal     Neuro/Psych    GI/Hepatic   Endo/Other  diabetes  Renal/GU      Musculoskeletal   Abdominal   Peds  Hematology   Anesthesia Other Findings   Reproductive/Obstetrics                             Anesthesia Physical Anesthesia Plan  ASA: III  Anesthesia Plan: Spinal, MAC and Regional   Post-op Pain Management:    Induction: Intravenous  Airway Management Planned: Natural Airway and Nasal Cannula  Additional Equipment:   Intra-op Plan:   Post-operative Plan:   Informed Consent: I have reviewed the patients History and Physical, chart, labs and discussed the procedure including the risks, benefits and alternatives for the proposed anesthesia with the patient or authorized representative who has indicated his/her understanding and acceptance.   Dental advisory given  Plan Discussed with: CRNA and Anesthesiologist  Anesthesia Plan Comments: (DJD L. Knee Type 2 DM glucose 127 Hypertension   Plan SAB with adductor canal block  Roberts Gaudy)        Anesthesia Quick Evaluation

## 2014-06-01 NOTE — Op Note (Signed)
PREOP DIAGNOSIS: DJD LEFT KNEE POSTOP DIAGNOSIS:  same PROCEDURE: LEFT TKR ANESTHESIA: Spinal and block ATTENDING SURGEON: Graysin Luczynski G ASSISTANT: Loni Dolly PA  INDICATIONS FOR PROCEDURE: Ryan Wagner is a 69 y.o. male who has struggled for a long time with pain due to degenerative arthritis of the left knee.  The patient has failed many conservative non-operative measures and at this point has pain which limits the ability to sleep and walk.  The patient is offered total knee replacement.  Informed operative consent was obtained after discussion of possible risks of anesthesia, infection, neurovascular injury, DVT, and death.  The importance of the post-operative rehabilitation protocol to optimize result was stressed extensively with the patient.  SUMMARY OF FINDINGS AND PROCEDURE:  Ryan Wagner was taken to the operative suite where under the above anesthesia a left knee replacement was performed.  There were advanced degenerative changes and the bone quality was excellent.  We used the DePuyAttune system and placed size 7 femur, 8 tibia, 38 mm all polyethylene patella, and a size 6 mm spacer.  Loni Dolly PA-C assisted throughout and was invaluable to the completion of the case in that he helped retract and maintain exposure while I placed the components.  He also helped close thereby minimizing OR time.  The patient was admitted for appropriate post-op care to include perioperative antibiotics and mechanical and pharmacologic measures for DVT prophylaxis.  DESCRIPTION OF PROCEDURE:  Ryan Wagner was taken to the operative suite where the above anesthesia was applied.  The patient was positioned supine and prepped and draped in normal sterile fashion.  An appropriate time out was performed.  After the administration of Kefzol pre-op antibiotic the leg was elevated and exsanguinated and a tourniquet inflated.  A standard longitudinal incision was made on the anterior knee.   Dissection was carried down to the extensor mechanism.  All appropriate anti-infective measures were used including the pre-operative antibiotic, betadine impregnated drape, and closed hooded exhaust systems for each member of the surgical team.  A medial parapatellar incision was made in the extensor mechanism and the knee cap flipped and the knee flexed.  Some residual meniscal tissues were removed along with any remaining ACL/PCL tissue.  A guide was placed on the tibia and a flat cut was made on it's superior surface.  An intramedullary guide was placed in the femur and was utilized to make anterior and posterior cuts creating an appropriate flexion gap.  A second intramedullary guide was placed in the femur to make a distal cut properly balancing the knee with an extension gap equal to the flexion gap.  The three bones sized to the above mentioned sizes and the appropriate guides were placed and utilized.  A trial reduction was done and the knee easily came to full extension and the patella tracked well on flexion.  The trial components were removed and all bones were cleaned with pulsatile lavage and then dried thoroughly.  Cement was mixed and was pressurized onto the bones followed by placement of the aforementioned components.  Excess cement was trimmed and pressure was held on the components until the cement had hardened.  The tourniquet was deflated and a small amount of bleeding was controlled with cautery and pressure.  The knee was irrigated thoroughly.  The extensor mechanism was re-approximated with V-loc suture in running fashion.  The knee was flexed and the repair was solid.  The subcutaneous tissues were re-approximated with #0 and #2-0 vicryl and the skin closed  with a subcuticular stitch and steristrips.  A sterile dressing was applied.  Intraoperative fluids, EBL, and tourniquet time can be obtained from anesthesia records.  DISPOSITION:  The patient was taken to recovery room in stable  condition and admitted for appropriate post-op care to include peri-operative antibiotic and DVT prophylaxis with mechanical and pharmacologic measures.  Shant Hence G 06/01/2014, 12:14 PM

## 2014-06-01 NOTE — OR Nursing (Signed)
Ortho tech notified of request for CPM while in PACU.  No CPMs available per ortho tech.

## 2014-06-01 NOTE — Anesthesia Postprocedure Evaluation (Signed)
  Anesthesia Post-op Note  Patient: Ryan Wagner  Procedure(s) Performed: Procedure(s): TOTAL KNEE ARTHROPLASTY (Left)  Patient Location: PACU  Anesthesia Type:General  Level of Consciousness: awake and alert   Airway and Oxygen Therapy: Patient Spontanous Breathing and Patient connected to nasal cannula oxygen  Post-op Pain: mild  Post-op Assessment: Post-op Vital signs reviewed, Patient's Cardiovascular Status Stable, Respiratory Function Stable, No signs of Nausea or vomiting and Pain level controlled  Post-op Vital Signs: stable  Last Vitals:  Filed Vitals:   06/01/14 1345  BP: 141/67  Pulse: 44  Temp:   Resp: 18    Complications: No apparent anesthesia complications

## 2014-06-01 NOTE — Anesthesia Procedure Notes (Addendum)
Anesthesia Regional Block:  Adductor canal block  Pre-Anesthetic Checklist: ,, timeout performed, Correct Patient, Correct Site, Correct Laterality, Correct Procedure, Correct Position, site marked, Risks and benefits discussed,  Surgical consent,  Pre-op evaluation,  At surgeon's request and post-op pain management  Laterality: Left  Prep: chloraprep       Needles:   Needle Type: Echogenic Stimulator Needle     Needle Length: 9cm 9 cm Needle Gauge: 22 and 22 G    Additional Needles:  Procedures: ultrasound guided (picture in chart) Adductor canal block Narrative:  Start time: 06/01/2014 10:00 AM End time: 06/01/2014 10:05 AM Injection made incrementally with aspirations every 5 mL.  Performed by: Personally   Additional Notes: 30 cc 0.5% Marcaine 1:200 Epi injected easily   Spinal Patient location during procedure: OR Start time: 06/01/2014 10:30 AM End time: 06/01/2014 10:35 AM Staffing Performed by: anesthesiologist  Preanesthetic Checklist Completed: patient identified, site marked, surgical consent, pre-op evaluation, timeout performed, IV checked, risks and benefits discussed and monitors and equipment checked Spinal Block Patient position: right lateral decubitus Prep: Betadine Patient monitoring: heart rate, cardiac monitor, continuous pulse ox and blood pressure Approach: right paramedian Location: L3-4 Injection technique: single-shot Needle Needle type: Tuohy  Needle gauge: 22 G Needle length: 9 cm Needle insertion depth: 8 cm Assessment Sensory level: T10 Additional Notes 10 mg 0.75% Marcaine injected easily

## 2014-06-01 NOTE — Transfer of Care (Signed)
Immediate Anesthesia Transfer of Care Note  Patient: Ryan Wagner  Procedure(s) Performed: Procedure(s): TOTAL KNEE ARTHROPLASTY (Left)  Patient Location: PACU  Anesthesia Type:MAC and Spinal  Level of Consciousness: awake, alert , oriented and patient cooperative  Airway & Oxygen Therapy: Patient Spontanous Breathing and Patient connected to nasal cannula oxygen  Post-op Assessment: Report given to PACU RN and Post -op Vital signs reviewed and stable  Post vital signs: Reviewed and stable  Complications: No apparent anesthesia complications

## 2014-06-01 NOTE — Progress Notes (Signed)
Orthopedic Tech Progress Note Patient Details:  Ryan Wagner January 08, 1946 [id number]  CPM Left Knee CPM Left Knee: On Left Knee Flexion (Degrees): 90 Left Knee Extension (Degrees): 0 Additional Comments: trapeze bar patient helper Viewed order from doctor's order list  Hildred Priest 06/01/2014, 4:42 PM

## 2014-06-01 NOTE — Interval H&P Note (Signed)
OK for surgery PD 

## 2014-06-01 NOTE — Progress Notes (Signed)
PHARMACIST - PHYSICIAN ORDER COMMUNICATION  CONCERNING: P&T Medication Policy on Herbal Medications  DESCRIPTION:  This patient's order for:  Co-Q 10  has been noted.  This product(s) is classified as an "herbal" or natural product. Due to a lack of definitive safety studies or FDA approval, nonstandard manufacturing practices, plus the potential risk of unknown drug-drug interactions while on inpatient medications, the Pharmacy and Therapeutics Committee does not permit the use of "herbal" or natural products of this type within [facility].   ACTION TAKEN: The pharmacy department is unable to verify this order at this time and your patient has been informed of this safety policy. Please reevaluate patient's clinical condition at discharge and address if the herbal or natural product(s) should be resumed at that time.  Erin Hearing PharmD., BCPS Clinical Pharmacist Pager [phone] 06/01/2014 5:28 PM

## 2014-06-02 LAB — BASIC METABOLIC PANEL
Anion gap: 8 (ref 5–15)
BUN: 8 mg/dL (ref 6–23)
CO2: 27 mmol/L (ref 19–32)
Calcium: 8.3 mg/dL — ABNORMAL LOW (ref 8.4–10.5)
Chloride: 101 mEq/L (ref 96–112)
Creatinine, Ser: 0.79 mg/dL (ref 0.50–1.35)
GFR calc Af Amer: >90 mL/min (ref >90)
GFR calc non Af Amer: >90 mL/min (ref >90)
Glucose, Bld: 134 mg/dL — ABNORMAL HIGH (ref 70–99)
Potassium: 3.9 mmol/L (ref 3.5–5.1)
Sodium: 136 mmol/L (ref 135–145)

## 2014-06-02 LAB — GLUCOSE, CAPILLARY
Glucose-Capillary: 132 mg/dL — ABNORMAL HIGH (ref 70–99)
Glucose-Capillary: 198 mg/dL — ABNORMAL HIGH (ref 70–99)

## 2014-06-02 LAB — CBC
HCT: 36.3 % — ABNORMAL LOW (ref 39.0–52.0)
Hemoglobin: 12.5 g/dL — ABNORMAL LOW (ref 13.0–17.0)
MCH: 30.1 pg (ref 26.0–34.0)
MCHC: 34.4 g/dL (ref 30.0–36.0)
MCV: 87.5 fL (ref 78.0–100.0)
Platelets: 200 10*3/uL (ref 150–400)
RBC: 4.15 MIL/uL — ABNORMAL LOW (ref 4.22–5.81)
RDW: 13.9 % (ref 11.5–15.5)
WBC: 10.7 10*3/uL — ABNORMAL HIGH (ref 4.0–10.5)

## 2014-06-02 MED ORDER — HYDROCODONE-ACETAMINOPHEN 7.5-325 MG PO TABS
1.0000 | ORAL_TABLET | ORAL | Status: DC | PRN
  Administered 2014-06-02 – 2014-06-03 (×6): 2 via ORAL
  Administered 2014-06-03: 1 via ORAL
  Filled 2014-06-02: qty 1
  Filled 2014-06-02 (×7): qty 2
  Filled 2014-06-02: qty 1

## 2014-06-02 NOTE — Evaluation (Signed)
Occupational Therapy Evaluation Patient Details Name: Ryan Wagner MRN: [id number] DOB: 1946/01/12 Today's Date: 06/02/2014    History of Present Illness 69 y.o. s/p left TKA.   Clinical Impression   Pt admitted with above. Pt independent with ADLs, PTA. Feel pt will benefit from acute OT to increase independence with BADLs, prior to d/c. Plan to practice LB ADLs and shower transfer next session.    Follow Up Recommendations  No OT follow up;Supervision - Intermittent    Equipment Recommendations  Other (comment) (AE)    Recommendations for Other Services       Precautions / Restrictions Precautions Precautions: Knee;Fall Precaution Booklet Issued: No Precaution Comments: educated on precautions Required Braces or Orthoses: Knee Immobilizer - Left (on when walking/standing) Restrictions Weight Bearing Restrictions: Yes LLE Weight Bearing: Weight bearing as tolerated      Mobility Bed Mobility Overal bed mobility: Needs Assistance Bed Mobility: Sit to Supine       Sit to supine: Min guard   General bed mobility comments: cues for technique to hook legs to assist LLE.  Transfers Overall transfer level: Needs assistance Equipment used: Rolling walker (2 wheeled) Transfers: Sit to/from Stand Sit to Stand: Min guard         General transfer comment: cues for hand placement/technique.    Balance                                            ADL Overall ADL's : Needs assistance/impaired     Grooming: Oral care;Set up;Supervision/safety;Standing               Lower Body Dressing: Moderate assistance;Sit to/from stand   Toilet Transfer: Min guard;Ambulation;Comfort height toilet;RW   Toileting- Clothing Manipulation and Hygiene: Supervision/safety (standing)       Functional mobility during ADLs: Min guard;Rolling walker General ADL Comments: Educated on LB dressing technique. Educated on safety such as safe shoewear, use of  bag on walker, sitting for LB ADLs. Educated on shower transfer technique. Educated on knee immobilizer and purpose. Educated on AE/cost/where to purchase. Explained benefit of reaching to left sock as it allows knee to bend.     Vision  Pt wears glasses.                      Perception     Praxis      Pertinent Vitals/Pain Pain Assessment: 0-10 Pain Score: 8  Pain Location: left knee Pain Intervention(s): Limited activity within patient's tolerance;Monitored during session     Hand Dominance     Extremity/Trunk Assessment Upper Extremity Assessment Upper Extremity Assessment: Overall WFL for tasks assessed   Lower Extremity Assessment Lower Extremity Assessment: Defer to PT evaluation       Communication Communication Communication: No difficulties   Cognition Arousal/Alertness: Awake/alert Behavior During Therapy: WFL for tasks assessed/performed Overall Cognitive Status: Within Functional Limits for tasks assessed                     General Comments       Exercises       Shoulder Instructions      Home Living Family/patient expects to be discharged to:: Private residence Living Arrangements: Spouse/significant other Available Help at Discharge: Family;Available 24 hours/day Type of Home: House Home Access: Stairs to enter [employer] of Steps: 6 Entrance Stairs-Rails: Right Home Layout:  Two level;Able to live on main level with bedroom/bathroom     Bathroom Shower/Tub: Tub only;Walk-in shower;Tub/shower unit (walk in shower on main floor) Shower/tub characteristics:  (door on walk in shower) Bathroom Toilet: Souris - single point;Walker - 2 wheels;Adaptive equipment;Shower seat - built in [employer]: Reacher        Prior Functioning/Environment Level of Independence: Independent             OT Diagnosis: Acute pain   OT Problem List: Decreased strength;Decreased range of  motion;Decreased activity tolerance;Decreased knowledge of use of DME or AE;Decreased knowledge of precautions;Impaired balance (sitting and/or standing);Pain   OT Treatment/Interventions: Self-care/ADL training;DME and/or AE instruction;Therapeutic activities;Patient/family education;Balance training    OT Goals(Current goals can be found in the care plan section) Acute Rehab OT Goals Patient Stated Goal: not stated OT Goal Formulation: With patient Time For Goal Achievement: 06/09/14 Potential to Achieve Goals: Good ADL Goals Pt Will Perform Lower Body Dressing: with modified independence;with adaptive equipment;sit to/from stand;with caregiver independent in assisting Pt Will Transfer to Toilet: with modified independence;ambulating (3 in 1 over toilet) Pt Will Perform Toileting - Clothing Manipulation and hygiene: with modified independence Pt Will Perform Tub/Shower Transfer: Shower transfer;ambulating;shower seat;rolling walker;with supervision  OT Frequency: Min 2X/week   Barriers to D/C:            Co-evaluation              End of Session Equipment Utilized During Treatment: Gait belt;Rolling walker;Left knee immobilizer CPM Left Knee CPM Left Knee: Off Nurse Communication: Other (comment) (stay on top of pain meds)  Activity Tolerance: Patient limited by pain Patient left: in bed;with call bell/phone within reach;with family/visitor present   Time: 6553-7482 OT Time Calculation (min): 28 min Charges:  OT General Charges $OT Visit: 1 Procedure OT Evaluation $Initial OT Evaluation Tier I: 1 Procedure OT Treatments $Self Care/Home Management : 8-22 mins G-CodesBenito Wagner OTR/L [phone] 06/02/2014, 11:40 AM

## 2014-06-02 NOTE — Progress Notes (Signed)
Utilization review complete. Kosta Schnitzler RN CCM Case Mgmt phone [phone] 

## 2014-06-02 NOTE — Plan of Care (Signed)
Problem: Consults Goal: Diagnosis- Total Joint Replacement Primary Total Knee Left     

## 2014-06-02 NOTE — Progress Notes (Signed)
Physical Therapy Treatment Patient Details Name: Ryan Wagner MRN: [id number] DOB: [birth year] Today's Date: 06/02/2014    History of Present Illness 69 y.o. s/p left TKA.    PT Comments    Patient progressing well with mobility. Tolerated stair negotiation this PM with Mod A for balance/safety. Need to perform family training on steps in AM with use of SPC to ensure safe entry into home. Reviewed HEP and handout. Will continue to follow acutely.   Follow Up Recommendations  Home health PT;Supervision/Assistance - 24 hour     Equipment Recommendations  None recommended by PT    Recommendations for Other Services       Precautions / Restrictions Precautions Precautions: Knee;Fall Precaution Booklet Issued: Yes (comment) Precaution Comments: Reviewed HEP and handout. Required Braces or Orthoses: Knee Immobilizer - Left Restrictions Weight Bearing Restrictions: Yes LLE Weight Bearing: Weight bearing as tolerated    Mobility  Bed Mobility Overal bed mobility: Needs Assistance Bed Mobility: Sit to Supine       Sit to supine: Min assist   General bed mobility comments: Min A to manage LLE.  Transfers Overall transfer level: Needs assistance Equipment used: Rolling walker (2 wheeled) Transfers: Sit to/from Stand Sit to Stand: Min guard         General transfer comment: Min guard for safety. Good hand placement/technique. Able to stand from low bench surface.   Ambulation/Gait Ambulation/Gait assistance: Min guard Ambulation Distance (Feet): 50 Feet (x2 bouts) Assistive device: Rolling walker (2 wheeled) Gait Pattern/deviations: Step-to pattern;Decreased stride length;Decreased stance time - left;Decreased stance time - right;Trunk flexed     General Gait Details: Cues to decrease step length and speed for safety. Short standing rest breaks due to fatigue.   Stairs Stairs: Yes Stairs assistance: Mod assist Stair Management: One rail Right;Step to  pattern;Forwards Number of Stairs: 5 General stair comments: Pt using rail on RUE and therapist for LUE support (to simulate SPC). Wife to bring [facility] for AM session. Cues for technique. Wife present.  Wheelchair Mobility    Modified Rankin (Stroke Patients Only)       Balance Overall balance assessment: Needs assistance Sitting-balance support: Feet supported;No upper extremity supported Sitting balance-Leahy Scale: Good     Standing balance support: During functional activity Standing balance-Leahy Scale: Poor                      Cognition Arousal/Alertness: Awake/alert Behavior During Therapy: WFL for tasks assessed/performed Overall Cognitive Status: Within Functional Limits for tasks assessed                      Exercises Total Joint Exercises Quad Sets: Left;5 reps;Supine Heel Slides: Left;5 reps;Supine (using sheet for assist.) Hip ABduction/ADduction: Left;AAROM;5 reps;Supine    General Comments General comments (skin integrity, edema, etc.): Discussed safe techniques for car transfers.       Pertinent Vitals/Pain Pain Assessment: 0-10 Pain Score: 3  Pain Location: left knee Pain Descriptors / Indicators: Sore Pain Intervention(s): Monitored during session;Repositioned    Home Living                      Prior Function            PT Goals (current goals can now be found in the care plan section) Progress towards PT goals: Progressing toward goals    Frequency  7X/week    PT Plan Current plan remains appropriate    Co-evaluation  End of Session Equipment Utilized During Treatment: Gait belt Activity Tolerance: Patient tolerated treatment well Patient left: in bed;with call bell/phone within reach;with family/visitor present     Time: [id number] PT Time Calculation (min) (ACUTE ONLY): 31 min  Charges:  $Gait Training: 23-37 mins                    G CodesCandy Sledge A June 06, 2014, 5:04 PM   Candy Sledge, [place], DPT [phone]

## 2014-06-02 NOTE — Progress Notes (Signed)
Subjective: 1 Day Post-Op Procedure(s) (LRB): TOTAL KNEE ARTHROPLASTY (Left)  Activity level:  wbat Diet tolerance:  Eating well Voiding:  ok Patient reports pain as mild and moderate.    Objective: Vital signs in last 24 hours: Temp:  [97.5 F (36.4 C)-98.4 F (36.9 C)] 98.4 F (36.9 C) (01/06 0600) Pulse Rate:  [44-68] 57 (01/06 0600) Resp:  [9-20] 16 (01/06 0600) BP: (107-159)/(58-93) 150/76 mmHg (01/06 0600) SpO2:  [96 %-100 %] 98 % (01/06 0600) Weight:  [88.451 kg (195 lb)] 88.451 kg (195 lb) (01/05 0857)  Labs: No results for input(s): HGB in the last 72 hours. No results for input(s): WBC, RBC, HCT, PLT in the last 72 hours. No results for input(s): NA, K, CL, CO2, BUN, CREATININE, GLUCOSE, CALCIUM in the last 72 hours. No results for input(s): LABPT, INR in the last 72 hours.  Physical Exam:  Neurologically intact ABD soft Neurovascular intact Sensation intact distally Intact pulses distally Dorsiflexion/Plantar flexion intact Incision: dressing C/D/I and no drainage No cellulitis present Compartment soft  Assessment/Plan:  1 Day Post-Op Procedure(s) (LRB): TOTAL KNEE ARTHROPLASTY (Left) Advance diet Up with therapy D/C IV fluids Plan for discharge tomorrow Discharge home with home health  We will increase norco to 7.5mg  from 5mg . Dressing change to aquacel prior to discharge. Continue on ASA 325mg  BID x 2 weeks post op.  Follow up in office 2 weeks post op.    Duanne Duchesne, Larwance Sachs 06/02/2014, 7:49 AM

## 2014-06-02 NOTE — Progress Notes (Signed)
Orthopedic Tech Progress Note Patient Details:  Ryan Wagner 1945-12-09 [id number] On cpm at 7:30 pm Patient ID: Ryan Wagner, male   DOB: 1945-09-06, 69 y.o.   MRN: [id number]   [name] 06/02/2014, 7:32 PM

## 2014-06-02 NOTE — Evaluation (Signed)
Physical Therapy Evaluation Patient Details Name: Ryan Wagner MRN: [id number] DOB: Sep 21, 1945 Today's Date: 06/02/2014   History of Present Illness  69 y.o. s/p left TKA.  Clinical Impression  Patient presents with pain, balance deficits and post surgical deficits LLE s/p L TKA. Tolerated ambulation with Min guard assist for safety. Education provided on HEP. Pt with increased pain during gait training. Pt would benefit from skilled PT to improve transfers, gait, balance and mobility so pt can maximize independence and return to PLOF. Plan to perform stair training in PM as tolerated.    Follow Up Recommendations Home health PT;Supervision/Assistance - 24 hour    Equipment Recommendations  None recommended by PT    Recommendations for Other Services       Precautions / Restrictions Precautions Precautions: Knee;Fall Precaution Booklet Issued: No Precaution Comments: educated on precautions Required Braces or Orthoses: Knee Immobilizer - Left Restrictions Weight Bearing Restrictions: Yes LLE Weight Bearing: Weight bearing as tolerated      Mobility  Bed Mobility Overal bed mobility: Needs Assistance Bed Mobility: Supine to Sit     Supine to sit: Min guard Sit to supine: Min guard   General bed mobility comments: HOB flat, no use of rails to simulate home environment.  Transfers Overall transfer level: Needs assistance Equipment used: Rolling walker (2 wheeled) Transfers: Sit to/from Stand Sit to Stand: Min guard         General transfer comment: Min guard for safety. Good hand placement/technique.  Ambulation/Gait Ambulation/Gait assistance: Min guard Ambulation Distance (Feet): 100 Feet Assistive device: Rolling walker (2 wheeled) Gait Pattern/deviations: Step-to pattern;Decreased stride length;Decreased stance time - left;Decreased step length - right;Trunk flexed   Gait velocity interpretation: Below normal speed for age/gender General Gait Details:  Cues for RW safety and to take smaller steps. Increased pain with ambulation.  Stairs            Wheelchair Mobility    Modified Rankin (Stroke Patients Only)       Balance Overall balance assessment: Needs assistance Sitting-balance support: Feet supported;No upper extremity supported Sitting balance-Leahy Scale: Fair     Standing balance support: During functional activity Standing balance-Leahy Scale: Poor Standing balance comment: Requires BUE support on RW for balance/safety.                             Pertinent Vitals/Pain Pain Assessment: 0-10 Pain Score: 6  Pain Location: left knee Pain Descriptors / Indicators: Sore Pain Intervention(s): Limited activity within patient's tolerance;Monitored during session;Repositioned;Ice applied    Home Living Family/patient expects to be discharged to:: Private residence Living Arrangements: Spouse/significant other Available Help at Discharge: Family;Available 24 hours/day Type of Home: House Home Access: Stairs to enter Entrance Stairs-Rails: Right Entrance Stairs-Number of Steps: 6 Home Layout: Two level;Able to live on main level with bedroom/bathroom Home Equipment: Ryan Wagner - single point;Walker - 2 wheels;Adaptive equipment;Shower seat - built in      Prior Function Level of Independence: Independent               Hand Dominance        Extremity/Trunk Assessment   Upper Extremity Assessment: Defer to OT evaluation;Overall [facility] for tasks assessed           Lower Extremity Assessment: LLE deficits/detail;Generalized weakness   LLE Deficits / Details: Limited AROM throughout hip, knee, ankle. No knee buckling during standing/gait however KI donned. Cannot perform SLR.     Communication  Communication: No difficulties  Cognition Arousal/Alertness: Awake/alert Behavior During Therapy: WFL for tasks assessed/performed Overall Cognitive Status: Within Functional Limits for tasks  assessed                      General Comments      Exercises Total Joint Exercises Ankle Circles/Pumps: Both;15 reps;Supine Quad Sets: Both;10 reps;Supine (3-5 sec hold.) Heel Slides: Left;Seated (30-45 sec hold at end range x8.) Other Exercises Other Exercises: Knee extension stretch with towel under ankle ~3 minutes.       Assessment/Plan    PT Assessment Patient needs continued PT services  PT Diagnosis Acute pain;Generalized weakness;Abnormality of gait   PT Problem List Decreased strength;Pain;Decreased range of motion;Decreased activity tolerance;Decreased mobility;Decreased balance  PT Treatment Interventions Balance training;Gait training;Patient/family education;Functional mobility training;Therapeutic activities;Therapeutic exercise;Stair training   PT Goals (Current goals can be found in the Care Plan section) Acute Rehab PT Goals Patient Stated Goal: to make this pain go away PT Goal Formulation: With patient Time For Goal Achievement: 06/16/14 Potential to Achieve Goals: Fair    Frequency 7X/week (BID)   Barriers to discharge        Co-evaluation               End of Session Equipment Utilized During Treatment: Gait belt Activity Tolerance: Patient tolerated treatment well;Patient limited by pain Patient left: in chair;with call bell/phone within reach;with family/visitor present Nurse Communication: Mobility status         Time: 0375-4360 PT Time Calculation (min) (ACUTE ONLY): 22 min   Charges:   PT Evaluation $Initial PT Evaluation Tier I: 1 Procedure PT Treatments $Gait Training: 8-22 mins   PT G CodesCandy Wagner A 06/19/2014, 1:03 PM Ryan Wagner, [place], DPT [phone]

## 2014-06-02 NOTE — Progress Notes (Signed)
CARE MANAGEMENT NOTE 06/02/2014  Patient:  Ryan Wagner, Ryan Wagner   Account Number:  [id number]  Date Initiated:  06/02/2014  Documentation initiated by:  [facility]  Subjective/Objective Assessment:   left total knee     Action/Plan:   lives at home with wife   Anticipated DC Date:  06/03/2014   Anticipated DC Plan:  [place]  CM consult      [facility] Choice  HOME HEALTH   Choice offered to / List presented to:  C-1 Patient   DME arranged  CP[facility]  [facility]  3-N-1      DME agency  [employer]     [place] arranged  HH-2 PT      [place].   Status of service:  Completed, signed off Medicare Important Message given?  NO (If response is "NO", the following Medicare IM given date fields will be blank) Date Medicare IM given:   Medicare IM given by:   Date Additional Medicare IM given:   Additional Medicare IM given by:    Discharge Disposition:  [place]  Per UR Regulation:    If discussed at Long Length of Stay Meetings, dates discussed:    Comments:  06/02/2014 1200 NCM spoke to pt and offered choice for [facility]. Pt agreeable to [facility] for HH. Pt has CPM, RW and 3n1 from [employer]. Wife at home to assist with care. Jonnie Finner RN CCM Case Mgmt phone [phone]

## 2014-06-03 ENCOUNTER — Encounter (HOSPITAL_COMMUNITY): Payer: Self-pay | Admitting: Orthopaedic Surgery

## 2014-06-03 LAB — CBC
HCT: 37 % — ABNORMAL LOW (ref 39.0–52.0)
Hemoglobin: 12.6 g/dL — ABNORMAL LOW (ref 13.0–17.0)
MCH: 29 pg (ref 26.0–34.0)
MCHC: 34.1 g/dL (ref 30.0–36.0)
MCV: 85.1 fL (ref 78.0–100.0)
Platelets: 203 10*3/uL (ref 150–400)
RBC: 4.35 MIL/uL (ref 4.22–5.81)
RDW: 13.7 % (ref 11.5–15.5)
WBC: 14.5 10*3/uL — ABNORMAL HIGH (ref 4.0–10.5)

## 2014-06-03 MED ORDER — HYDROCODONE-ACETAMINOPHEN 7.5-325 MG PO TABS: 1.0000 | ORAL_TABLET | ORAL | Status: DC | PRN

## 2014-06-03 MED ORDER — ASPIRIN 325 MG PO TBEC: 325.0000 mg | DELAYED_RELEASE_TABLET | Freq: Two times a day (BID) | ORAL | Status: DC

## 2014-06-03 MED ORDER — METHOCARBAMOL 500 MG PO TABS: 500.0000 mg | ORAL_TABLET | Freq: Four times a day (QID) | ORAL | Status: DC | PRN

## 2014-06-03 NOTE — Discharge Summary (Signed)
Patient ID: Ryan Wagner MRN: [id number] DOB/AGE: 1945-08-30 69 y.o.  Admit date: 06/01/2014 Discharge date: 06/03/2014  Admission Diagnoses:  Principal Problem:   Primary osteoarthritis of left knee Active Problems:   Diabetes   Primary osteoarthritis of knee   Discharge Diagnoses:  Same  Past Medical History  Diagnosis Date  . Hypertension   . Hyperlipidemia   . CAD (coronary artery disease)     stents x 3 in 2004  . Diabetes mellitus without complication     type 2  . GERD (gastroesophageal reflux disease)     no longer with symptoms  . Headache     as a child  . Sciatica     improved  . Arthritis     knees, hands  . Complication of anesthesia     had difficulty voiding after hemorrhoidectomy    Surgeries: Procedure(s): TOTAL KNEE ARTHROPLASTY on 06/01/2014   Consultants:    Discharged Condition: Improved  Hospital Course: JAHARI HANSFORD is an 69 y.o. male who was admitted 06/01/2014 for operative treatment ofPrimary osteoarthritis of left knee. Patient has severe unremitting pain that affects sleep, daily activities, and work/hobbies. After pre-op clearance the patient was taken to the operating room on 06/01/2014 and underwent  Procedure(s): TOTAL KNEE ARTHROPLASTY.    Patient was given perioperative antibiotics: Anti-infectives    Start     Dose/Rate Route Frequency Ordered Stop   06/01/14 1800  ceFAZolin (ANCEF) IVPB 2 g/50 mL premix     2 g100 mL/hr over 30 Minutes Intravenous Every 6 hours 06/01/14 1715 06/02/14 0103   06/01/14 0813  ceFAZolin (ANCEF) IVPB 2 g/50 mL premix     2 g100 mL/hr over 30 Minutes Intravenous On call to O.R. 06/01/14 0813 06/01/14 1046       Patient was given sequential compression devices, early ambulation, and chemoprophylaxis to prevent DVT.  Patient benefited maximally from hospital stay and there were no complications.    Recent vital signs: Patient Vitals for the past 24 hrs:  BP Temp Temp src Pulse Resp SpO2   06/03/14 0533 (!) 154/74 mmHg 98.8 F (37.1 C) - 78 18 97 %  06/02/14 2009 (!) 151/79 mmHg 99 F (37.2 C) Oral 94 17 95 %     Recent laboratory studies:  Recent Labs  06/02/14 0605 06/03/14 0821  WBC 10.7* 14.5*  HGB 12.5* 12.6*  HCT 36.3* 37.0*  PLT 200 203  NA 136  --   K 3.9  --   CL 101  --   CO2 27  --   BUN 8  --   CREATININE 0.79  --   GLUCOSE 134*  --   CALCIUM 8.3*  --      Discharge Medications:     Medication List    STOP taking these medications        aspirin 81 MG tablet  Replaced by:  aspirin 325 MG EC tablet      TAKE these medications        amLODipine-benazepril 10-20 MG per capsule  Commonly known as:  LOTREL  Take 1 capsule by mouth daily.     ARGININE PO  Take 1 tablet by mouth 2 (two) times daily.     aspirin 325 MG EC tablet  Take 1 tablet (325 mg total) by mouth 2 (two) times daily after a meal.     atorvastatin 40 MG tablet  Commonly known as:  LIPITOR  Take 40 mg by mouth daily.  Co Q 10 100 MG Caps  Take by mouth daily.     fish oil-omega-3 fatty acids 1000 MG capsule  Take 1 g by mouth daily.     HYDROcodone-acetaminophen 7.5-325 MG per tablet  Commonly known as:  NORCO  Take 1-2 tablets by mouth every 4 (four) hours as needed for moderate pain.     lidocaine 2 % jelly  Commonly known as:  XYLOCAINE     metFORMIN 500 MG tablet  Commonly known as:  GLUCOPHAGE  Take 500 mg by mouth 2 (two) times daily with a meal.     methocarbamol 500 MG tablet  Commonly known as:  ROBAXIN  Take 1 tablet (500 mg total) by mouth every 6 (six) hours as needed for muscle spasms.     metoprolol 50 MG tablet  Commonly known as:  LOPRESSOR  Take 50 mg by mouth 2 (two) times daily.     multivitamin tablet  Take 1 tablet by mouth daily.     polyethylene glycol powder powder  Commonly known as:  GLYCOLAX/MIRALAX     ranitidine 150 MG tablet  Commonly known as:  ZANTAC  Take 150 mg by mouth 2 (two) times daily.     VICTOZA 18  MG/3ML Soln injection  Generic drug:  Liraglutide  Inject 1.2 mg into the skin daily.        Diagnostic Studies: Dg Chest 2 View  06/01/2014   CLINICAL DATA:  69 year old male preoperative study for knee surgery. Hypertension and coronary artery disease. Initial encounter.  EXAM: CHEST  2 VIEW  COMPARISON:  Upper GI series 124 2005.  FINDINGS: Lung volumes within normal limits. Normal cardiac size and mediastinal contours. Visualized tracheal air column is within normal limits. No pneumothorax, pulmonary edema, pleural effusion or confluent pulmonary opacity. Flowing osteophytes in the thoracic spine. Dextro convex scoliosis. No acute osseous abnormality identified.  IMPRESSION: No acute cardiopulmonary abnormality.   Electronically Signed   By: Lars Pinks M.D.   On: 06/01/2014 09:20    Disposition: 01-Home or Self Care      Discharge Instructions    Call MD / Call 911    Complete by:  As directed   If you experience chest pain or shortness of breath, CALL 911 and be transported to the hospital emergency room.  If you develope a fever above 101 F, pus (white drainage) or increased drainage or redness at the wound, or calf pain, call your surgeon's office.     Constipation Prevention    Complete by:  As directed   Drink plenty of fluids.  Prune juice may be helpful.  You may use a stool softener, such as Colace (over the counter) 100 mg twice a day.  Use MiraLax (over the counter) for constipation as needed.     Diet - low sodium heart healthy    Complete by:  As directed      Increase activity slowly as tolerated    Complete by:  As directed            Follow-up Information    Follow up with Hessie Dibble, MD. Call in 2 weeks.   Specialty:  Orthopedic Surgery   Contact information:   [place] [address] [phone]       Follow up with [place].   Why:  Home Health Physical Therapy   Contact information:   [address] [place] [address] [phone]  Signed: Rich Fuchs 06/03/2014, 3:01 PM

## 2014-06-03 NOTE — Progress Notes (Signed)
Physical Therapy Treatment Patient Details Name: Ryan Wagner MRN: [id number] DOB: 24-Dec-1945 Today's Date: 06/03/2014    History of Present Illness 69 y.o. s/p left TKA.    PT Comments    Patient progressing well and able to complete steps with cane. Patient safe to D/C from a mobility standpoint based on progression towards goals set on PT eval.    Follow Up Recommendations  Home health PT;Supervision/Assistance - 24 hour     Equipment Recommendations  None recommended by PT    Recommendations for Other Services       Precautions / Restrictions Precautions Precautions: Knee;Fall Precaution Comments: Reviewed HEP and handout. Required Braces or Orthoses: Knee Immobilizer - Left    Mobility  Bed Mobility Overal bed mobility: Needs Assistance       Supine to sit: Modified independent (Device/Increase time) Sit to supine: Min assist   General bed mobility comments: Min A to manage LLE.  Transfers Overall transfer level: Needs assistance Equipment used: Rolling walker (2 wheeled)   Sit to Stand: Supervision         General transfer comment: CUes for positioning with sitting  Ambulation/Gait Ambulation/Gait assistance: Supervision Ambulation Distance (Feet): 60 Feet (x2) Assistive device: Rolling walker (2 wheeled) Gait Pattern/deviations: Step-to pattern;Decreased stance time - left;Decreased step length - right     General Gait Details: Continues with step to pattern due to increase pain   Stairs Stairs: Yes Stairs assistance: Min guard Stair Management: Step to pattern;Forwards;One rail Right;With cane Number of Stairs: 5 General stair comments: CUes for use of cane.   Wheelchair Mobility    Modified Rankin (Stroke Patients Only)       Balance                                    Cognition Arousal/Alertness: Awake/alert Behavior During Therapy: WFL for tasks assessed/performed Overall Cognitive Status: Within  Functional Limits for tasks assessed                      Exercises Total Joint Exercises Quad Sets: AROM;Left;10 reps Heel Slides: AAROM;Left;10 reps Hip ABduction/ADduction: AROM;Left;10 reps Straight Leg Raises: AAROM;Left;10 reps Long Arc Quad: AAROM;Left;10 reps    General Comments        Pertinent Vitals/Pain Pain Score: 4  Pain Location: L knee Pain Descriptors / Indicators: Sore Pain Intervention(s): Monitored during session    Home Living                      Prior Function            PT Goals (current goals can now be found in the care plan section) Progress towards PT goals: Progressing toward goals    Frequency  7X/week    PT Plan Current plan remains appropriate    Co-evaluation             End of Session Equipment Utilized During Treatment: Gait belt Activity Tolerance: Patient tolerated treatment well Patient left: in bed;with call bell/phone within reach;with family/visitor present     Time: 2800-3491 PT Time Calculation (min) (ACUTE ONLY): 32 min  Charges:  $Gait Training: 8-22 mins $Therapeutic Exercise: 8-22 mins                    G Codes:      Jacqualyn Posey 06/03/2014, 10:00 AM 06/03/2014 Draiden Mirsky, Tonia Brooms PTA  [phone] pager [phone] office

## 2014-06-03 NOTE — Progress Notes (Signed)
Occupational Therapy Treatment Patient Details Name: VINAY ERTL MRN: [id number] DOB: Jul 23, 1945 Today's Date: 06/03/2014    History of present illness 69 y.o. s/p left TKA.   OT comments  All education regarding ADL and functional mobility for ADL. Pt ready to D/C home.  Follow Up Recommendations  No OT follow up;Supervision - Intermittent    Equipment Recommendations  None recommended by OT    Recommendations for Other Services      Precautions / Restrictions Precautions Precautions: Knee;Fall Precaution Comments: Reviewed HEP and handout. Required Braces or Orthoses: Knee Immobilizer - Left Restrictions LLE Weight Bearing: Weight bearing as tolerated       Mobility Bed Mobility Overal bed mobility: Needs Assistance       Supine to sit: Modified independent (Device/Increase time) Sit to supine: Min assist   General bed mobility comments: Min A to manage LLE.  Transfers Overall transfer level: Needs assistance Equipment used: Rolling walker (2 wheeled) Transfers: Sit to/from [facility] Sit to Stand: Supervision Stand pivot transfers: Supervision       General transfer comment: CUes for positioning with sitting    Balance Overall balance assessment: Needs assistance   Sitting balance-Leahy Scale: Good     Standing balance support: During functional activity Standing balance-Leahy Scale: Fair                     ADL Overall ADL's : Needs assistance/impaired                     Lower Body Dressing: Sit to/from stand;Minimal assistance   Toilet Transfer: Supervision/safety   Toileting- Clothing Manipulation and Hygiene: Supervision/safety   Tub/ Shower Transfer: Nurse, learning disability Details (indicate cue type and reason): Educated pt/wife on backing into shower with RW. Pt unable to clear foot off floor to get into shower. Recommended pt try to lift unoperated foot off floor in static standing with RW  to practice before trying to transfer into shower. Functional mobility during ADLs: Supervision/safety;Rolling walker General ADL Comments: Pt aware of AE if needed.Encouraged pt to continue to reach feet to improve ROM for ADLPt with knee into flexed position on entry to room. Discussed importance of terminal knee extension.                                       Cognition   Behavior During Therapy: WFL for tasks assessed/performed Overall Cognitive Status: Within Functional Limits for tasks assessed                                                      Pertinent Vitals/ Pain       Pain Score: 4  Pain Location: L knee Pain Descriptors / Indicators: Sore Pain Intervention(s): Limited activity within patient's tolerance;Repositioned;Ice applied                                                          Frequency Min 2X/week     Progress Toward Goals  OT Goals(current goals can now be found  in the care plan section)  Progress towards OT goals: Goals met/education completed, patient discharged from OT (appropriate for D/C)  Acute Rehab OT Goals Patient Stated Goal: to go home OT Goal Formulation: With patient Time For Goal Achievement: 06/09/14 Potential to Achieve Goals: Good ADL Goals Pt Will Perform Lower Body Dressing: with modified independence;with adaptive equipment;sit to/from stand;with caregiver independent in assisting Pt Will Transfer to Toilet: with modified independence;ambulating Pt Will Perform Toileting - Clothing Manipulation and hygiene: with modified independence Pt Will Perform Tub/Shower Transfer: Shower transfer;ambulating;shower seat;rolling walker;with supervision  Plan Discharge plan remains appropriate    Maurie Boettcher, OTR/L  [phone] 06/03/2014                End of Session Equipment Utilized During Treatment: Gait belt;Rolling walker;Left knee immobilizer   Activity Tolerance  Patient tolerated treatment well   Patient Left in bed;with call bell/phone within reach;with family/visitor present   Nurse Communication Mobility status        Time: 7225-7505 OT Time Calculation (min): 26 min  Charges: OT General Charges $OT Visit: 1 Procedure  Abem Shaddix,HILLARY 06/03/2014, 1:38 PM

## 2014-11-22 ENCOUNTER — Other Ambulatory Visit (HOSPITAL_COMMUNITY): Payer: Self-pay | Admitting: Orthopaedic Surgery

## 2014-11-22 ENCOUNTER — Ambulatory Visit (HOSPITAL_COMMUNITY)
Admission: RE | Admit: 2014-11-22 | Discharge: 2014-11-22 | Disposition: A | Discharge status: Home / Self Care | Payer: PPO | Source: Ambulatory Visit | Attending: Orthopaedic Surgery | Admitting: Orthopaedic Surgery

## 2014-11-22 DIAGNOSIS — M79605 Pain in left leg: Secondary | ICD-10-CM | POA: Diagnosis not present

## 2014-11-22 DIAGNOSIS — R609 Edema, unspecified: Secondary | ICD-10-CM | POA: Diagnosis not present

## 2014-11-22 NOTE — Progress Notes (Signed)
VASCULAR LAB PRELIMINARY  PRELIMINARY  PRELIMINARY  PRELIMINARY  Left lower extremity venous Doppler completed.    Preliminary report:  There is no DVT or SVT noted in the left lower extremity.  There is a anechoic area in the proximal calf possibly consistent with a muscle tear.  Marrietta Thunder, RVT 11/22/2014, 4:38 PM

## 2015-01-26 NOTE — Patient Outreach (Signed)
[facility]) Care Management  01/26/2015  VAUGHN BEAUMIER 1946/04/16 [id number]   Referral from HTA tier 4 list, assigned to Thea Silversmith, [facility] for patient outreach.  Kinsler Soeder L. Elzabeth Mcquerry, [place] Care Management Assistant

## 2015-02-08 ENCOUNTER — Other Ambulatory Visit: Payer: Self-pay

## 2015-02-08 NOTE — Patient Outreach (Signed)
[facility]) Care Management  02/08/2015  MAYCOL HOYING 02-08-1946 [id number]   Assessment: [employer] Tier 4 list. RNCM Called to screen for Care management needs. No answer. HIPPA compliant message left.  Plan: Await return call. Follow up call in 1-2 days if no return call.  Thea Silversmith, RN, MSN, [place] Coordinator Cell: [phone]

## 2015-02-10 ENCOUNTER — Other Ambulatory Visit: Payer: Self-pay

## 2015-02-10 NOTE — Patient Outreach (Signed)
[facility]) Care Management  02/10/2015  JAEGAR CROFT November 14, 1945 [id number]  Assessment-Referral from [employer] tier 4 list. No answer. HIPPA compliant message left.  Plan: await return call. Follow up call in 1-2 days if no return call.  Thea Silversmith, RN, MSN, [place] Coordinator Cell: [phone]

## 2015-02-17 ENCOUNTER — Other Ambulatory Visit: Payer: Self-pay

## 2015-02-17 NOTE — Patient Outreach (Signed)
[facility]) Care Management  02/17/2015  Ryan Wagner 04/04/1946 [id number]  Assessment-Referral from [place] answering phone stated [name] should be there after 3 pm.   Plan: RNCM will call back later today.  Thea Silversmith, RN, MSN, [place] Coordinator Cell: [phone]

## 2015-02-17 NOTE — Patient Outreach (Signed)
[facility]) Care Management  02/17/2015  DAO MEMMOTT [birth year] [id number]   Assessment: Referral from High Risk list-Follow up call from earlier today. Call to reach member-2nd attempt today. No answer. HIPPA compliant message left. Third day of unsuccessful outreach calls.  Plan: Geophysicist/field seismologist.  Thea Silversmith, RN, MSN, [place] Coordinator Cell: [phone]

## 2015-02-21 ENCOUNTER — Other Ambulatory Visit: Payer: Self-pay

## 2015-02-21 NOTE — Patient Outreach (Signed)
[facility]) Care Management  02/21/2015  Ryan Wagner 1945-12-17 [id number]   Subjective: "all my numbers look good"  Assessment-referral from the [employer] high risk list. Received return call from Ryan Wagner. RNCM discussed [place] Management program. Ryan Wagner reports he is working full time, has no issues with transportation or his medications. Denies and educational needs regarding disease management. Ryan Wagner states he follow up with his primary care and has no healthcare issues or concerns at this time.  Plan: close case. Send letter to primary care.  Thea Silversmith, RN, MSN, [place] Coordinator Cell: [phone]

## 2015-02-22 NOTE — Patient Outreach (Signed)
[facility]) Care Management  02/22/2015  IVOR KISHI 03-Jan-1946 [id number]   Notification received from Thea Silversmith, RN to close case due to patient refusing services.  Damita L. Rhodie, [place] Care Management Assistant

## 2015-05-26 ENCOUNTER — Telehealth: Payer: Self-pay | Admitting: Cardiovascular Disease

## 2015-05-26 NOTE — Telephone Encounter (Signed)
Received records from [place] for appointment on 07/05/15 with Dr Claiborne Billings.  Records given to [facility] (medical records) for Dr Evette Georges schedule on 07/05/15. lp

## 2015-06-08 DIAGNOSIS — R1310 Dysphagia, unspecified: Secondary | ICD-10-CM | POA: Diagnosis not present

## 2015-06-08 DIAGNOSIS — Z1211 Encounter for screening for malignant neoplasm of colon: Secondary | ICD-10-CM | POA: Diagnosis not present

## 2015-06-23 ENCOUNTER — Other Ambulatory Visit: Payer: Self-pay | Admitting: Gastroenterology

## 2015-06-23 DIAGNOSIS — Z1211 Encounter for screening for malignant neoplasm of colon: Secondary | ICD-10-CM | POA: Diagnosis not present

## 2015-06-23 DIAGNOSIS — K621 Rectal polyp: Secondary | ICD-10-CM | POA: Diagnosis not present

## 2015-06-23 DIAGNOSIS — K222 Esophageal obstruction: Secondary | ICD-10-CM | POA: Diagnosis not present

## 2015-06-23 DIAGNOSIS — R1314 Dysphagia, pharyngoesophageal phase: Secondary | ICD-10-CM | POA: Diagnosis not present

## 2015-06-23 DIAGNOSIS — K3189 Other diseases of stomach and duodenum: Secondary | ICD-10-CM | POA: Diagnosis not present

## 2015-06-23 DIAGNOSIS — D127 Benign neoplasm of rectosigmoid junction: Secondary | ICD-10-CM | POA: Diagnosis not present

## 2015-06-23 DIAGNOSIS — K210 Gastro-esophageal reflux disease with esophagitis: Secondary | ICD-10-CM | POA: Diagnosis not present

## 2015-06-23 DIAGNOSIS — K529 Noninfective gastroenteritis and colitis, unspecified: Secondary | ICD-10-CM | POA: Diagnosis not present

## 2015-07-05 ENCOUNTER — Ambulatory Visit (INDEPENDENT_AMBULATORY_CARE_PROVIDER_SITE_OTHER): Payer: PPO | Admitting: Cardiovascular Disease

## 2015-07-05 ENCOUNTER — Encounter: Payer: Self-pay | Admitting: Cardiovascular Disease

## 2015-07-05 VITALS — BP 138/74 | HR 59 | Ht 67.0 in | Wt 194.0 lb

## 2015-07-05 DIAGNOSIS — E119 Type 2 diabetes mellitus without complications: Secondary | ICD-10-CM

## 2015-07-05 DIAGNOSIS — Z79899 Other long term (current) drug therapy: Secondary | ICD-10-CM | POA: Diagnosis not present

## 2015-07-05 DIAGNOSIS — E785 Hyperlipidemia, unspecified: Secondary | ICD-10-CM | POA: Diagnosis not present

## 2015-07-05 DIAGNOSIS — I10 Essential (primary) hypertension: Secondary | ICD-10-CM

## 2015-07-05 DIAGNOSIS — I2510 Atherosclerotic heart disease of native coronary artery without angina pectoris: Secondary | ICD-10-CM | POA: Diagnosis not present

## 2015-07-05 DIAGNOSIS — I252 Old myocardial infarction: Secondary | ICD-10-CM

## 2015-07-05 NOTE — Patient Instructions (Addendum)
Your physician has requested that you have an echocardiogram. Echocardiography is a painless test that uses sound waves to create images of your heart. It provides your doctor with information about the size and shape of your heart and how well your heart's chambers and valves are working. This procedure takes approximately one hour. There are no restrictions for this procedure.   Your physician wants you to follow-up in: 1 year or sooner if needed. You will receive a reminder letter in the mail two months in advance. If you don't receive a letter, please call our office to schedule the follow-up appointment.  If you need a refill on your cardiac medications before your next appointment, please call your pharmacy.

## 2015-07-07 ENCOUNTER — Encounter: Payer: Self-pay | Admitting: Cardiovascular Disease

## 2015-07-07 DIAGNOSIS — I2510 Atherosclerotic heart disease of native coronary artery without angina pectoris: Secondary | ICD-10-CM | POA: Insufficient documentation

## 2015-07-07 DIAGNOSIS — I252 Old myocardial infarction: Secondary | ICD-10-CM | POA: Insufficient documentation

## 2015-07-07 DIAGNOSIS — Z9861 Coronary angioplasty status: Secondary | ICD-10-CM

## 2015-07-07 DIAGNOSIS — E785 Hyperlipidemia, unspecified: Secondary | ICD-10-CM | POA: Insufficient documentation

## 2015-07-07 NOTE — Progress Notes (Signed)
Patient ID: Ryan Wagner, male   DOB: [birth year], 70 y.o.   MRN: [id number]    Primary MD: Dr. Ernestine Conrad  HPI: Ryan Wagner is a 70 y.o. male who presents to the office today to reestablish cardiology care.  He is a former patient of Dr. Chase Picket and last saw him in January 2010.  Ryan Wagner has a history of CAD and suffered an inferior wall myocardial infarction on 11/21/2002.  3.  Drug-eluting stents were placed in the RCA and at that time, he had moderate to severe disease in the obtuse marginal system.  He underwent repeat cardiac catheterization by Dr. Rex Kras which revealed diffuse disease in the circumflex vessel amenable to percutaneous coronary intervention which medical therapy was recommended.  A nuclear perfusion study was done in October 2007 showed fairly normal perfusion in all myocardial segments.  He initially had been on dual antiplatelet therapy, but his Plavix was stopped 2008.  When he last saw Dr. Rex Kras in January 2010 he was without recurrent anginal symptoms.  He is followed by Dr. [name] for primary medical care.  Additional problems include type 2 diabetes mellitus, hyperlipidemia, hypertension, arthritis.  There is also a history of hiatal hernia with esophageal stricture.  He had undergone left knee replacement surgery.  He had seen Dr. [name] last in November 2016.  At that time he was on metoprolol, tartrate 50 Milgram's twice a day, Lotrel 10/20 daily for blood pressure control.  He was on atorvastatin 40 mg for hyperlipidemia.  He has been taking Victoza for his diabetes mellitus.  Laboratory done at that time revealed a hemoglobin of 15.6, hematocrit of 45.6.  Hemoglobin A1c was 5.9.  Lipid studies revealed a QTC 137, TG 1:30, HDL 32, LDL 78, and non-HDL 104.  He had normal renal function with a BUN of 12 and a creatinine of 0.81.  LFTs were normal.  TSH was normal at 0.90.  When seen by Dr. [name] follow-up cardiology was advised.   The  patient denies recent development of any chest pain.  He denies PND or orthopnea.  He states he has lost weight from 218 pounds to 194 pounds.  He denies palpitations.  He presents for evaluation    Past Medical History  Diagnosis Date  . Hypertension   . Hyperlipidemia   . CAD (coronary artery disease)     stents x 3 in 2004  . Diabetes mellitus without complication ([place])     type 2  . GERD (gastroesophageal reflux disease)     no longer with symptoms  . Headache     as a child  . Sciatica     improved  . Arthritis     knees, hands  . Complication of anesthesia     had difficulty voiding after hemorrhoidectomy    Past Surgical History  Procedure Laterality Date  . Lipoma excision      neck  . Esophageal dilation    . Knee surgery Left   . Cardiac catheterization    . Tonsillectomy    . Adenoidectomy    . Colonoscopy    . Coronary angioplasty  2004  . Appendectomy    . Total knee arthroplasty Left 06/01/2014    Procedure: TOTAL KNEE ARTHROPLASTY;  Surgeon: Hessie Dibble, MD;  Location: [facility];  Service: Orthopedics;  Laterality: Left;    No Known Allergies  Current Outpatient Prescriptions  Medication Sig Dispense Refill  . amLODipine-benazepril (LOTREL) 10-20 MG per capsule  Take 1 capsule by mouth daily.    . ARGININE PO Take 1 tablet by mouth 2 (two) times daily.     [name] aspirin 81 MG tablet Take 81 mg by mouth daily.    [name] atorvastatin (LIPITOR) 40 MG tablet Take 20 mg by mouth daily.     . Coenzyme Q10 (CO Q 10) 100 MG CAPS Take by mouth daily.    . fish oil-omega-3 fatty acids 1000 MG capsule Take 1 g by mouth daily.     [name] lidocaine (XYLOCAINE) 2 % jelly     . Liraglutide (VICTOZA) 18 MG/3ML SOLN Inject 1.2 mg into the skin daily.     . metFORMIN (GLUCOPHAGE) 500 MG tablet Take 500 mg by mouth 2 (two) times daily with a meal.    . methocarbamol (ROBAXIN) 500 MG tablet Take 1 tablet (500 mg total) by mouth every 6 (six) hours as needed for muscle spasms. 50  tablet 0  . metoprolol (LOPRESSOR) 50 MG tablet Take 50 mg by mouth 2 (two) times daily.    . Multiple Vitamin (MULTIVITAMIN) tablet Take 1 tablet by mouth daily.     No current facility-administered medications for this visit.    Social History   Social History  . Marital Status: Married    Spouse Name: N/A  . Number of Children: N/A  . Years of Education: N/A   Occupational History  . Not on file.   Social History Main Topics  . Smoking status: Never Smoker   . Smokeless tobacco: Never Used  . Alcohol Use: Yes     Comment: occasional  . Drug Use: No  . Sexual Activity: Not on file   Other Topics Concern  . Not on file   Social History Narrative   Additional social history is notable that he is married since 98.  He has 2 children 5 grandchildren.  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  He attended [employer] for college.  There is no tobacco.  He drinks occasional wine.  He does walk but not regularly.  Family History  Problem Relation Age of Onset  . Dementia Mother   . Thyroid disease Mother   . Alzheimer's disease Father    Additional family history is notable that both parents died at age 109.  His father died of pneumonia.  He has one sister age 38, who is healthy.  His children are age 47 and 21.  ROS General: Negative; No fevers, chills, or night sweats HEENT: Negative; No changes in vision or hearing, sinus congestion, difficulty swallowing Pulmonary: Negative; No cough, wheezing, shortness of breath, hemoptysis Cardiovascular: See HPI: No chest pain, presyncope, syncope, palpatations GI: Negative; No nausea, vomiting, diarrhea, or abdominal pain GU: Negative; No dysuria, hematuria, or difficulty voiding Musculoskeletal: Negative; no myalgias, joint pain, or weakness Hematologic: Negative; no easy bruising, bleeding Endocrine: Negative; no heat/cold intolerance; no diabetes, Neuro: Negative; no changes in balance, headaches Skin: Negative;  No rashes or skin lesions Psychiatric: Negative; No behavioral problems, depression Sleep: Negative; No snoring,  daytime sleepiness, hypersomnolence, bruxism, restless legs, hypnogognic hallucinations. Other comprehensive 14 point system review is negative   Physical Exam BP 138/74 mmHg  Pulse 59  Ht '5\' 7"'$  (1.702 m)  Wt 194 lb (87.998 kg)  BMI 30.38 kg/m2   Repeat blood pressure was 150/70  Wt Readings from Last 3 Encounters:  07/05/15 194 lb (87.998 kg)  06/01/14 195 lb (88.451 kg)  05/19/14 195 lb 3.2 oz (  88.542 kg)   General: Alert, oriented, no distress.  Skin: normal turgor, no rashes, warm and dry HEENT: Normocephalic, atraumatic. Pupils equal round and reactive to light; sclera anicteric; extraocular muscles intact, No lid lag; Nose without nasal septal hypertrophy; Mouth/Parynx benign; Mallinpatti scale 2 Neck: No JVD, no carotid bruits; normal carotid upstroke Lungs: clear to ausculatation and percussion bilaterally; no wheezing or rales, normal inspiratory and expiratory effort Chest wall: without tenderness to palpitation Heart: PMI not displaced, RRR, s1 s2 normal, [id number] systolic murmur along the left sternal border systolic murmur, No diastolic murmur, no rubs, gallops, thrills, or heaves Abdomen: soft, nontender; no hepatosplenomehaly, BS+; abdominal aorta nontender and not dilated by palpation. Back: no CVA tenderness Pulses: 2+  Musculoskeletal: full range of motion, normal strength, no joint deformities Extremities: Pulses 2+, no clubbing cyanosis or edema, Homan's sign negative  Neurologic: grossly nonfocal; Cranial nerves grossly wnl Psychologic: Normal mood and affect   ECG (independently read by me): Sinus bradycardia 59 bpm.  First-degree AV block with a PR interval at 232 ms.  No significant ST-T changes.   LABS: I personally reviewed the laboratory done at [facility] by Dr Viona Gilmore. Azalia Bilis as noted above.  BMP Latest Ref Rng 06/02/2014 05/19/2014  Glucose  70 - 99 mg/dL 134(H) 112(H)  BUN 6 - 23 mg/dL 8 11  Creatinine 0.50 - 1.35 mg/dL 0.79 0.91  Sodium 135 - 145 mmol/L 136 141  Potassium 3.5 - 5.1 mmol/L 3.9 4.4  Chloride 96 - 112 mEq/L 101 106  CO2 19 - 32 mmol/L 27 25  Calcium 8.4 - 10.5 mg/dL 8.3(L) 9.1     No flowsheet data found.  CBC Latest Ref Rng 06/03/2014 06/02/2014 05/19/2014  WBC 4.0 - 10.5 K/uL 14.5(H) 10.7(H) 6.9  Hemoglobin 13.0 - 17.0 g/dL 12.6(L) 12.5(L) 15.6  Hematocrit 39.0 - 52.0 % 37.0(L) 36.3(L) 45.7  Platelets 150 - 400 K/uL 203 200 209   Lab Results  Component Value Date   MCV 85.1 06/03/2014   MCV 87.5 06/02/2014   MCV 85.9 05/19/2014    No results found for: TSH  BNP No results found for: BNP  ProBNP No results found for: PROBNP   Lipid Panel  No results found for: CHOL, TRIG, HDL, CHOLHDL, VLDL, LDLCALC, LDLDIRECT   RADIOLOGY: No results found.    ASSESSMENT AND PLAN: Mr. Demarcos Palomares is a pleasant 70 year old gentleman who suffered an inferior wall myocardial infarction in 2004 and was treated successfully with insertion of 3 stents in his RCA.  He has been found to have diffuse disease in his circumflex vessel.  His last nuclear perfusion study was in 2007 which showed fairly normal perfusion.  He has a history of hypertension and currently is on metoprolol 50 twice a day in addition to Lotrel 10/20 .  Initial blood pressure when taken by the nurse was normal but systolic was mildly elevated when rechecked by me.  He has been on atorvastatin for 20 mg for hyperlipidemia, and most recent lipid studies show fairly good control.  He is diabetic with recent good control on Victoza.  His last echo Doppler study was in 2003.  Physical exam reveals a systolic murmur.  I am scheduling him for a 14 year follow-up echo Doppler study for reevaluation of his systolic and diastolic function as well as valvular architecture.  He is on baby aspirin alone and no longer is on dual antiplatelet therapy.  He  denies any difficulty with sleep.  An Epworth Sleepiness Scale  score today was calculated at 3 arguing against any daytime sleepiness.  I will contact him regarding his echo Doppler assessment.  As long as he remains stable, I will see him in one year for cardiology reevaluation.     Troy Sine, MD, [facility]  07/07/2015 11:09 AM

## 2015-07-15 ENCOUNTER — Other Ambulatory Visit (HOSPITAL_COMMUNITY): Payer: PPO

## 2015-07-26 ENCOUNTER — Ambulatory Visit (HOSPITAL_COMMUNITY): Payer: PPO | Attending: Cardiovascular Disease

## 2015-07-26 ENCOUNTER — Other Ambulatory Visit: Payer: Self-pay

## 2015-07-26 DIAGNOSIS — I2510 Atherosclerotic heart disease of native coronary artery without angina pectoris: Secondary | ICD-10-CM | POA: Insufficient documentation

## 2015-07-26 DIAGNOSIS — I10 Essential (primary) hypertension: Secondary | ICD-10-CM

## 2015-07-26 DIAGNOSIS — E119 Type 2 diabetes mellitus without complications: Secondary | ICD-10-CM | POA: Insufficient documentation

## 2015-07-26 MED ORDER — PERFLUTREN LIPID MICROSPHERE
1.0000 mL | INTRAVENOUS | Status: AC | PRN
  Administered 2015-07-26: 1 mL via INTRAVENOUS

## 2015-11-15 DIAGNOSIS — I10 Essential (primary) hypertension: Secondary | ICD-10-CM | POA: Diagnosis not present

## 2015-11-15 DIAGNOSIS — Z7984 Long term (current) use of oral hypoglycemic drugs: Secondary | ICD-10-CM | POA: Diagnosis not present

## 2015-11-15 DIAGNOSIS — K219 Gastro-esophageal reflux disease without esophagitis: Secondary | ICD-10-CM | POA: Diagnosis not present

## 2015-11-15 DIAGNOSIS — E782 Mixed hyperlipidemia: Secondary | ICD-10-CM | POA: Diagnosis not present

## 2015-11-15 DIAGNOSIS — E119 Type 2 diabetes mellitus without complications: Secondary | ICD-10-CM | POA: Diagnosis not present

## 2016-04-18 DIAGNOSIS — Z125 Encounter for screening for malignant neoplasm of prostate: Secondary | ICD-10-CM | POA: Diagnosis not present

## 2016-04-18 DIAGNOSIS — I10 Essential (primary) hypertension: Secondary | ICD-10-CM | POA: Diagnosis not present

## 2016-04-18 DIAGNOSIS — M79662 Pain in left lower leg: Secondary | ICD-10-CM | POA: Diagnosis not present

## 2016-04-18 DIAGNOSIS — K219 Gastro-esophageal reflux disease without esophagitis: Secondary | ICD-10-CM | POA: Diagnosis not present

## 2016-04-18 DIAGNOSIS — E119 Type 2 diabetes mellitus without complications: Secondary | ICD-10-CM | POA: Diagnosis not present

## 2016-04-18 DIAGNOSIS — Z1389 Encounter for screening for other disorder: Secondary | ICD-10-CM | POA: Diagnosis not present

## 2016-04-18 DIAGNOSIS — E782 Mixed hyperlipidemia: Secondary | ICD-10-CM | POA: Diagnosis not present

## 2016-10-17 DIAGNOSIS — K219 Gastro-esophageal reflux disease without esophagitis: Secondary | ICD-10-CM | POA: Diagnosis not present

## 2016-10-17 DIAGNOSIS — E782 Mixed hyperlipidemia: Secondary | ICD-10-CM | POA: Diagnosis not present

## 2016-10-17 DIAGNOSIS — E119 Type 2 diabetes mellitus without complications: Secondary | ICD-10-CM | POA: Diagnosis not present

## 2016-10-17 DIAGNOSIS — I2510 Atherosclerotic heart disease of native coronary artery without angina pectoris: Secondary | ICD-10-CM | POA: Diagnosis not present

## 2016-10-17 DIAGNOSIS — I10 Essential (primary) hypertension: Secondary | ICD-10-CM | POA: Diagnosis not present

## 2016-10-17 DIAGNOSIS — Z7984 Long term (current) use of oral hypoglycemic drugs: Secondary | ICD-10-CM | POA: Diagnosis not present

## 2016-10-24 ENCOUNTER — Ambulatory Visit: Payer: PPO | Admitting: Cardiovascular Disease

## 2016-11-01 NOTE — Progress Notes (Signed)
Cardiology Office Note    Date:  11/02/2016   ID:  Ryan Wagner, DOB 1946/05/11, MRN [id number]  PCP:  Shirline Frees, MD  Cardiologist: Dr. Claiborne Billings   Chief Complaint  Patient presents with  . Follow-up    12 months    History of Present Illness:    Ryan Wagner is a 71 y.o. male with past medical history of CAD (prior MI in 2004 with 3 DES to the RCA), HTN, HLD, Type 2 DM, and OA who presents to the office today for 28-month follow-up.   He was last examined by Dr. Claiborne Billings in 06/2015 and reported doing well from a cardiac perspective at that time, denying any recent chest pain, palpitations, or dyspnea on exertion. A repeat echocardiogram was recommended which showed a preserved EF of 60-65% with no regional WMA.   In talking with the patient today, he reports overall doing well since last office visit. He denies any recent episodes of chest discomfort or dyspnea on exertion. No orthopnea, PND, lower extremity edema, presyncope, or palpitations.  He works out with a Clinical research associate 3 times per week and performs aerobic and weightlifting activities with no anginal symptoms. He is continuing to work on his diet as well. He brings with him labs which were recently checked by his PCP and showed Hgb A1c 6.5, WBC 8.2, Hgb 16.2, platelets 215, creatinine 0.79. Lipid Panel from 03/2016 showed total cholesterol 151, HDL 34, and LDL 81.  Past Medical History:  Diagnosis Date  . Arthritis    knees, hands  . CAD (coronary artery disease)    a. prior MI in 2004 with DESx3 to RCA  . Complication of anesthesia    had difficulty voiding after hemorrhoidectomy  . Diabetes mellitus without complication ([place])    type 2  . GERD (gastroesophageal reflux disease)    no longer with symptoms  . Headache    as a child  . Hyperlipidemia   . Hypertension   . Sciatica    improved    Past Surgical History:  Procedure Laterality Date  . ADENOIDECTOMY    . APPENDECTOMY    . CARDIAC  CATHETERIZATION    . COLONOSCOPY    . CORONARY ANGIOPLASTY  2004  . ESOPHAGEAL DILATION    . KNEE SURGERY Left   . LIPOMA EXCISION     neck  . TONSILLECTOMY    . TOTAL KNEE ARTHROPLASTY Left 06/01/2014   Procedure: TOTAL KNEE ARTHROPLASTY;  Surgeon: Hessie Dibble, MD;  Location: [facility];  Service: Orthopedics;  Laterality: Left;    Current Medications: Outpatient Medications Prior to Visit  Medication Sig Dispense Refill  . amLODipine-benazepril (LOTREL) 10-20 MG per capsule Take 1 capsule by mouth daily.    . ARGININE PO Take 1 tablet by mouth 2 (two) times daily.     [name] aspirin 81 MG tablet Take 81 mg by mouth daily.    [name] atorvastatin (LIPITOR) 40 MG tablet Take 20 mg by mouth daily.     . Coenzyme Q10 (CO Q 10) 100 MG CAPS Take by mouth daily.    . fish oil-omega-3 fatty acids 1000 MG capsule Take 1 g by mouth daily.     [name] lidocaine (XYLOCAINE) 2 % jelly     . Liraglutide (VICTOZA) 18 MG/3ML SOLN Inject 1.2 mg into the skin daily.     . metFORMIN (GLUCOPHAGE) 500 MG tablet Take 500 mg by mouth 2 (two) times daily with a meal.    .  methocarbamol (ROBAXIN) 500 MG tablet Take 1 tablet (500 mg total) by mouth every 6 (six) hours as needed for muscle spasms. 50 tablet 0  . metoprolol (LOPRESSOR) 50 MG tablet Take 50 mg by mouth 2 (two) times daily.    . Multiple Vitamin (MULTIVITAMIN) tablet Take 1 tablet by mouth daily.     No facility-administered medications prior to visit.      Allergies:   Patient has no known allergies.   Social History   Social History  . Marital status: Married    Spouse name: N/A  . Number of children: N/A  . Years of education: N/A   Social History Main Topics  . Smoking status: Never Smoker  . Smokeless tobacco: Never Used  . Alcohol use Yes     Comment: occasional  . Drug use: No  . Sexual activity: Not Asked   Other Topics Concern  . None   Social History Narrative  . None     Family History:  The patient's family history includes  Alzheimer's disease in his father; Dementia in his mother; Thyroid disease in his mother.   Review of Systems:   Please see the history of present illness.     General:  No chills, fever, night sweats or weight changes.  Cardiovascular:  No chest pain, dyspnea on exertion, edema, orthopnea, palpitations, paroxysmal nocturnal dyspnea. Dermatological: No rash, lesions/masses Respiratory: No cough, dyspnea Urologic: No hematuria, dysuria Abdominal:   No nausea, vomiting, diarrhea, bright red blood per rectum, melena, or hematemesis Neurologic:  No visual changes, wkns, changes in mental status.  He denies any of the above symptoms.   All other systems reviewed and are otherwise negative except as noted above.   Physical Exam:    VS:  BP 138/66   Pulse (!) 55   Ht 5\' 7"  (1.702 m)   Wt 198 lb (89.8 kg)   BMI 31.01 kg/m    General: Well developed, well nourished Caucasian male appearing in no acute distress. Head: Normocephalic, atraumatic, sclera non-icteric, no xanthomas, nares are without discharge.  Neck: No carotid bruits. JVD not elevated.  Lungs: Respirations regular and unlabored, without wheezes or rales.  Heart: Regular rate and rhythm. No S3 or S4.  No murmur, no rubs, or gallops appreciated. Abdomen: Soft, non-tender, non-distended with normoactive bowel sounds. No hepatomegaly. No rebound/guarding. No obvious abdominal masses. Msk:  Strength and tone appear normal for age. No joint deformities or effusions. Extremities: No clubbing or cyanosis. No lower extremity edema.  Distal pedal pulses are 2+ bilaterally. Neuro: Alert and oriented X 3. Moves all extremities spontaneously. No focal deficits noted. Psych:  Responds to questions appropriately with a normal affect. Skin: No rashes or lesions noted  Wt Readings from Last 3 Encounters:  11/02/16 198 lb (89.8 kg)  07/05/15 194 lb (88 kg)  06/01/14 195 lb (88.5 kg)     Studies/Labs Reviewed:   EKG:  EKG is ordered  today.  The ekg ordered today demonstrates sinus bradycardia with 1st degree AV Block, HR 55. No acute ST or T-wave changes when compared to prior tracings.   Recent Labs: No results found for requested labs within last 8760 hours.   Lipid Panel No results found for: CHOL, TRIG, HDL, CHOLHDL, VLDL, LDLCALC, LDLDIRECT  Additional studies/ records that were reviewed today include:   Echocardiogram: 06/2015 Study Conclusions  - Procedure narrative: Transthoracic echocardiography. Image   quality was adequate. Intravenous contrast (Definity) was   administered. - Left ventricle: The  cavity size was normal. Systolic function was   normal. The estimated ejection fraction was in the range of 60%   to 65%. Wall motion was normal; there were no regional wall   motion abnormalities. - Atrial septum: No defect or patent foramen ovale was identified.  Assessment:    1. Coronary artery disease involving native coronary artery of native heart without angina pectoris   2. Essential hypertension, benign   3. Hyperlipidemia LDL goal <70   4. Type 2 diabetes mellitus without complication, without long-term current use of insulin ([place])      Plan:   In order of problems listed above:  1. CAD - s/p prior MI in 2004 with 3 DES to the RCA. Recent echo in 06/2015 showed a preserved EF of 60-65% with no WMA.  - he denies any recent chest pain or dyspnea on exertion.  - continue ASA, BB, and statin therapy. Would not further titrate his BB with his borderline HR.   2. HTN - BP is controlled at 138/66 during today's visit.  - continue Amlodipine-Benazepril 10-20mg  daily and Lopressor 50mg  BID.   3. HLD - followed by PCP. Lipid Panel in 03/2016 showed total cholesterol 151, HDL 34, and LDL 81. Goal LDL is < 70 with known CAD. - continue Lipitor 40mg  daily along with Arginine and Fish Oil. Continued diet and exercise encouraged.    4. Type 2 DM - Hgb A1c at 6.5 when checked last month.  -  currently on Victoza and Metorfmin.    Medication Adjustments/Labs and Tests Ordered: Current medicines are reviewed at length with the patient today.  Concerns regarding medicines are outlined above.  Medication changes, Labs and Tests ordered today are listed in the Patient Instructions below. Patient Instructions  Medication Instructions: No changes     Labwork: None  Follow-Up: Your physician wants you to follow-up in: One year with Dr. Claiborne Billings. You will receive a reminder letter in the mail two months in advance. If you don't receive a letter, please call our office to schedule the follow-up appointment.  If you need a refill on your cardiac medications before your next appointment, please call your pharmacy.    Signed, Erma Heritage, PA-C  11/02/2016 12:56 PM    [facility], [place], [place]  [address] Phone: [phone]; Fax: [phone]  [address] [place], [place] [address] Phone: [phone]

## 2016-11-02 ENCOUNTER — Ambulatory Visit (INDEPENDENT_AMBULATORY_CARE_PROVIDER_SITE_OTHER): Payer: PPO | Admitting: Student

## 2016-11-02 ENCOUNTER — Encounter: Payer: Self-pay | Admitting: Student

## 2016-11-02 VITALS — BP 138/66 | HR 55 | Ht 67.0 in | Wt 198.0 lb

## 2016-11-02 DIAGNOSIS — I2510 Atherosclerotic heart disease of native coronary artery without angina pectoris: Secondary | ICD-10-CM

## 2016-11-02 DIAGNOSIS — E785 Hyperlipidemia, unspecified: Secondary | ICD-10-CM | POA: Diagnosis not present

## 2016-11-02 DIAGNOSIS — E119 Type 2 diabetes mellitus without complications: Secondary | ICD-10-CM

## 2016-11-02 DIAGNOSIS — I10 Essential (primary) hypertension: Secondary | ICD-10-CM

## 2016-11-02 NOTE — Patient Instructions (Signed)
Medication Instructions: No changes     Labwork: None  Follow-Up: Your physician wants you to follow-up in: One year with Dr. Claiborne Billings. You will receive a reminder letter in the mail two months in advance. If you don't receive a letter, please call our office to schedule the follow-up appointment.    If you need a refill on your cardiac medications before your next appointment, please call your pharmacy.

## 2017-01-03 DIAGNOSIS — D1801 Hemangioma of skin and subcutaneous tissue: Secondary | ICD-10-CM | POA: Diagnosis not present

## 2017-01-03 DIAGNOSIS — L570 Actinic keratosis: Secondary | ICD-10-CM | POA: Diagnosis not present

## 2017-01-03 DIAGNOSIS — L821 Other seborrheic keratosis: Secondary | ICD-10-CM | POA: Diagnosis not present

## 2017-04-10 DIAGNOSIS — I10 Essential (primary) hypertension: Secondary | ICD-10-CM | POA: Diagnosis not present

## 2017-04-10 DIAGNOSIS — Z125 Encounter for screening for malignant neoplasm of prostate: Secondary | ICD-10-CM | POA: Diagnosis not present

## 2017-04-10 DIAGNOSIS — E782 Mixed hyperlipidemia: Secondary | ICD-10-CM | POA: Diagnosis not present

## 2017-04-10 DIAGNOSIS — I2510 Atherosclerotic heart disease of native coronary artery without angina pectoris: Secondary | ICD-10-CM | POA: Diagnosis not present

## 2017-04-10 DIAGNOSIS — E119 Type 2 diabetes mellitus without complications: Secondary | ICD-10-CM | POA: Diagnosis not present

## 2017-04-10 DIAGNOSIS — K219 Gastro-esophageal reflux disease without esophagitis: Secondary | ICD-10-CM | POA: Diagnosis not present

## 2017-07-10 DIAGNOSIS — J209 Acute bronchitis, unspecified: Secondary | ICD-10-CM | POA: Diagnosis not present

## 2017-10-04 DIAGNOSIS — I252 Old myocardial infarction: Secondary | ICD-10-CM | POA: Diagnosis not present

## 2017-10-04 DIAGNOSIS — E119 Type 2 diabetes mellitus without complications: Secondary | ICD-10-CM | POA: Diagnosis not present

## 2017-10-04 DIAGNOSIS — E785 Hyperlipidemia, unspecified: Secondary | ICD-10-CM | POA: Diagnosis not present

## 2017-10-04 DIAGNOSIS — K219 Gastro-esophageal reflux disease without esophagitis: Secondary | ICD-10-CM | POA: Diagnosis not present

## 2017-10-04 DIAGNOSIS — Z6832 Body mass index (BMI) 32.0-32.9, adult: Secondary | ICD-10-CM | POA: Diagnosis not present

## 2017-10-04 DIAGNOSIS — E669 Obesity, unspecified: Secondary | ICD-10-CM | POA: Diagnosis not present

## 2017-10-04 DIAGNOSIS — Z7982 Long term (current) use of aspirin: Secondary | ICD-10-CM | POA: Diagnosis not present

## 2017-10-04 DIAGNOSIS — Z7984 Long term (current) use of oral hypoglycemic drugs: Secondary | ICD-10-CM | POA: Diagnosis not present

## 2017-10-04 DIAGNOSIS — I2510 Atherosclerotic heart disease of native coronary artery without angina pectoris: Secondary | ICD-10-CM | POA: Diagnosis not present

## 2017-10-04 DIAGNOSIS — I10 Essential (primary) hypertension: Secondary | ICD-10-CM | POA: Diagnosis not present

## 2017-10-15 DIAGNOSIS — I10 Essential (primary) hypertension: Secondary | ICD-10-CM | POA: Diagnosis not present

## 2017-10-15 DIAGNOSIS — M179 Osteoarthritis of knee, unspecified: Secondary | ICD-10-CM | POA: Diagnosis not present

## 2017-10-15 DIAGNOSIS — R252 Cramp and spasm: Secondary | ICD-10-CM | POA: Diagnosis not present

## 2017-10-15 DIAGNOSIS — E119 Type 2 diabetes mellitus without complications: Secondary | ICD-10-CM | POA: Diagnosis not present

## 2017-10-15 DIAGNOSIS — K219 Gastro-esophageal reflux disease without esophagitis: Secondary | ICD-10-CM | POA: Diagnosis not present

## 2017-10-15 DIAGNOSIS — I2510 Atherosclerotic heart disease of native coronary artery without angina pectoris: Secondary | ICD-10-CM | POA: Diagnosis not present

## 2017-10-15 DIAGNOSIS — E782 Mixed hyperlipidemia: Secondary | ICD-10-CM | POA: Diagnosis not present

## 2017-11-14 DIAGNOSIS — M19071 Primary osteoarthritis, right ankle and foot: Secondary | ICD-10-CM | POA: Diagnosis not present

## 2017-11-14 DIAGNOSIS — M25511 Pain in right shoulder: Secondary | ICD-10-CM | POA: Diagnosis not present

## 2017-11-14 DIAGNOSIS — M19072 Primary osteoarthritis, left ankle and foot: Secondary | ICD-10-CM | POA: Diagnosis not present

## 2017-11-14 DIAGNOSIS — M542 Cervicalgia: Secondary | ICD-10-CM | POA: Diagnosis not present

## 2017-11-14 DIAGNOSIS — M25512 Pain in left shoulder: Secondary | ICD-10-CM | POA: Diagnosis not present

## 2017-11-14 DIAGNOSIS — M5030 Other cervical disc degeneration, unspecified cervical region: Secondary | ICD-10-CM | POA: Diagnosis not present

## 2017-11-19 ENCOUNTER — Other Ambulatory Visit (HOSPITAL_COMMUNITY): Payer: Self-pay | Admitting: Orthopedic Surgery

## 2017-11-19 DIAGNOSIS — M25512 Pain in left shoulder: Secondary | ICD-10-CM

## 2017-11-19 DIAGNOSIS — M542 Cervicalgia: Secondary | ICD-10-CM

## 2017-11-19 DIAGNOSIS — M25511 Pain in right shoulder: Secondary | ICD-10-CM

## 2017-12-03 ENCOUNTER — Ambulatory Visit (HOSPITAL_COMMUNITY)
Admission: RE | Admit: 2017-12-03 | Discharge: 2017-12-03 | Disposition: A | Discharge status: Home / Self Care | Payer: Medicare HMO | Source: Ambulatory Visit | Attending: Orthopedic Surgery | Admitting: Orthopedic Surgery

## 2017-12-03 DIAGNOSIS — M4802 Spinal stenosis, cervical region: Secondary | ICD-10-CM | POA: Diagnosis not present

## 2017-12-03 DIAGNOSIS — M25511 Pain in right shoulder: Secondary | ICD-10-CM | POA: Insufficient documentation

## 2017-12-03 DIAGNOSIS — Q761 Klippel-Feil syndrome: Secondary | ICD-10-CM | POA: Diagnosis not present

## 2017-12-03 DIAGNOSIS — G959 Disease of spinal cord, unspecified: Secondary | ICD-10-CM | POA: Diagnosis not present

## 2017-12-03 DIAGNOSIS — M2578 Osteophyte, vertebrae: Secondary | ICD-10-CM | POA: Diagnosis not present

## 2017-12-03 DIAGNOSIS — M542 Cervicalgia: Secondary | ICD-10-CM

## 2017-12-03 DIAGNOSIS — G9529 Other cord compression: Secondary | ICD-10-CM | POA: Insufficient documentation

## 2017-12-03 DIAGNOSIS — M25512 Pain in left shoulder: Secondary | ICD-10-CM | POA: Insufficient documentation

## 2017-12-13 DIAGNOSIS — M50221 Other cervical disc displacement at C4-C5 level: Secondary | ICD-10-CM | POA: Diagnosis not present

## 2017-12-13 DIAGNOSIS — M5030 Other cervical disc degeneration, unspecified cervical region: Secondary | ICD-10-CM | POA: Diagnosis not present

## 2017-12-13 DIAGNOSIS — M50821 Other cervical disc disorders at C4-C5 level: Secondary | ICD-10-CM | POA: Diagnosis not present

## 2017-12-13 DIAGNOSIS — M4802 Spinal stenosis, cervical region: Secondary | ICD-10-CM | POA: Diagnosis not present

## 2017-12-30 DIAGNOSIS — I2510 Atherosclerotic heart disease of native coronary artery without angina pectoris: Secondary | ICD-10-CM | POA: Diagnosis not present

## 2017-12-30 DIAGNOSIS — I10 Essential (primary) hypertension: Secondary | ICD-10-CM | POA: Diagnosis not present

## 2017-12-30 DIAGNOSIS — I499 Cardiac arrhythmia, unspecified: Secondary | ICD-10-CM | POA: Diagnosis not present

## 2018-01-08 DIAGNOSIS — M4802 Spinal stenosis, cervical region: Secondary | ICD-10-CM | POA: Diagnosis not present

## 2018-01-08 DIAGNOSIS — M5412 Radiculopathy, cervical region: Secondary | ICD-10-CM | POA: Diagnosis not present

## 2018-01-15 ENCOUNTER — Ambulatory Visit (INDEPENDENT_AMBULATORY_CARE_PROVIDER_SITE_OTHER): Payer: Medicare HMO

## 2018-01-15 ENCOUNTER — Other Ambulatory Visit: Payer: Self-pay | Admitting: Family Medicine

## 2018-01-15 ENCOUNTER — Telehealth: Payer: Self-pay

## 2018-01-15 DIAGNOSIS — I493 Ventricular premature depolarization: Secondary | ICD-10-CM

## 2018-01-15 DIAGNOSIS — I440 Atrioventricular block, first degree: Secondary | ICD-10-CM

## 2018-01-15 DIAGNOSIS — I499 Cardiac arrhythmia, unspecified: Secondary | ICD-10-CM

## 2018-01-15 NOTE — Telephone Encounter (Signed)
LATE ENTRY.....SENT REFERRAL TO SCHEDULING AND FILED NOTES

## 2018-01-26 ENCOUNTER — Telehealth: Payer: Self-pay | Admitting: Cardiology

## 2018-01-26 NOTE — Telephone Encounter (Signed)
Received incoming page from [facility] regarding pt going into brief new onset atrial flutter with rate control early this AM approximately 0345am. Representative states that the patient is now in NSR. Contacted the patient who is asymptomatic. He states that he was initially notified of possible AF by his Apple device on 10/01/17 and again on 11/23/17. He then called our office to try to schedule a close appointment without success. He proceeded to follow with his PCP who set him up with an EM. He has an appointment with our office for follow up this week on 01/29/18. I spoke with DOD secondary for the need for anticoagulation who states that this can wait for initiation at follow up visit. Will call pt back with updated plan.   [name] NP-C [place] Pager: [phone]

## 2018-01-28 NOTE — Progress Notes (Signed)
Cardiology Office Note:    Date:  01/29/2018   ID:  Ryan Wagner, DOB 1946-04-11, MRN [id number]  PCP:  Shirline Frees, MD  Cardiologist:  Shelva Majestic, MD   Referring MD: Shirline Frees, MD   Chief Complaint  Patient presents with  . Irregular Heart Beat    detected on biotel    History of Present Illness:    Ryan Wagner is a 72 y.o. male with a hx of CAD (prior MI in 2004 with DES x 3 to RCA), HTN, HLD, DM, and OA. He was last seen 11/02/16 for his annual follow up and was doing well at that time. He was working out at [employer] with a trainer 3 times weekly.   He presents today for new onset atrial flutter found on his Apple Device using biotel, setup by PCP, on 01/26/18. He states that he was initially notified by his Apple device of possible Afib on 10/01/17 and again on 11/23/17. He was unable to get an appt with our office and instead followed up with his PCP. PCP set him up with biotel. He was notified on 01/26/18 with arrhythmia.   He presents today for follow-up concerning this arrhythmia.  He is generally unaware of his arrhythmia.  He denies chest pain, shortness of breath, palpitations, dizziness, syncope.  We discussed pathophysiology of atrial fibrillation and possible need for blood thinner.  Past Medical History:  Diagnosis Date  . Arthritis    knees, hands  . CAD (coronary artery disease)    a. prior MI in 2004 with DESx3 to RCA  . Complication of anesthesia    had difficulty voiding after hemorrhoidectomy  . Diabetes mellitus without complication ([place])    type 2  . GERD (gastroesophageal reflux disease)    no longer with symptoms  . Headache    as a child  . Hyperlipidemia   . Hypertension   . Sciatica    improved    Past Surgical History:  Procedure Laterality Date  . ADENOIDECTOMY    . APPENDECTOMY    . CARDIAC CATHETERIZATION    . COLONOSCOPY    . CORONARY ANGIOPLASTY  2004  . ESOPHAGEAL DILATION    . KNEE SURGERY Left   . LIPOMA EXCISION      neck  . TONSILLECTOMY    . TOTAL KNEE ARTHROPLASTY Left 06/01/2014   Procedure: TOTAL KNEE ARTHROPLASTY;  Surgeon: Hessie Dibble, MD;  Location: [facility];  Service: Orthopedics;  Laterality: Left;    Current Medications: Current Meds  Medication Sig  . amLODipine-benazepril (LOTREL) 10-20 MG per capsule Take 1 capsule by mouth daily.  . ARGININE PO Take 1 tablet by mouth 2 (two) times daily.   [name] aspirin 81 MG tablet Take 81 mg by mouth daily.  [name] atorvastatin (LIPITOR) 40 MG tablet Take 20 mg by mouth daily.   . Coenzyme Q10 (CO Q 10) 100 MG CAPS Take by mouth daily.  . fish oil-omega-3 fatty acids 1000 MG capsule Take 1 g by mouth daily.   . Liraglutide (VICTOZA) 18 MG/3ML SOLN Inject 1.2 mg into the skin daily.   . metFORMIN (GLUCOPHAGE) 500 MG tablet Take 500 mg by mouth 2 (two) times daily with a meal.  . methocarbamol (ROBAXIN) 500 MG tablet Take 1 tablet (500 mg total) by mouth every 6 (six) hours as needed for muscle spasms.  . metoprolol (LOPRESSOR) 50 MG tablet Take 50 mg by mouth 2 (two) times daily.  . Multiple Vitamin (  MULTIVITAMIN) tablet Take 1 tablet by mouth daily.     Allergies:   Patient has no known allergies.   Social History   Socioeconomic History  . Marital status: Married    Spouse name: Not on file  . Number of children: Not on file  . Years of education: Not on file  . Highest education level: Not on file  Occupational History  . Not on file  Social Needs  . Financial resource strain: Not on file  . Food insecurity:    Worry: Not on file    Inability: Not on file  . Transportation needs:    Medical: Not on file    Non-medical: Not on file  Tobacco Use  . Smoking status: Never Smoker  . Smokeless tobacco: Never Used  Substance and Sexual Activity  . Alcohol use: Yes    Comment: occasional  . Drug use: No  . Sexual activity: Not on file  Lifestyle  . Physical activity:    Days per week: Not on file    Minutes per session: Not on file  .  Stress: Not on file  Relationships  . Social connections:    Talks on phone: Not on file    Gets together: Not on file    Attends religious service: Not on file    Active member of club or organization: Not on file    Attends meetings of clubs or organizations: Not on file    Relationship status: Not on file  Other Topics Concern  . Not on file  Social History Narrative  . Not on file     Family History: The patient's family history includes Alzheimer's disease in his father; Dementia in his mother; Thyroid disease in his mother.  ROS:   Please see the history of present illness.     All other systems reviewed and are negative.  EKGs/Labs/Other Studies Reviewed:    The following studies were reviewed today:  Monitor - final read pending  EKG:  EKG is ordered today.  The ekg ordered today demonstrates sinus rhythm  Recent Labs: No results found for requested labs within last 8760 hours.  Recent Lipid Panel No results found for: CHOL, TRIG, HDL, CHOLHDL, VLDL, LDLCALC, LDLDIRECT  Physical Exam:    VS:  BP (!) 150/80   Pulse (!) 58   Ht 5\' 7"  (1.702 m)   Wt 200 lb (90.7 kg)   BMI 31.32 kg/m     Wt Readings from Last 3 Encounters:  01/29/18 200 lb (90.7 kg)  11/02/16 198 lb (89.8 kg)  07/05/15 194 lb (88 kg)     GEN: Well nourished, well developed in no acute distress HEENT: Normal NECK: No JVD; No carotid bruits LYMPHATICS: No lymphadenopathy CARDIAC: RRR, no murmurs, rubs, gallops RESPIRATORY:  Clear to auscultation without rales, wheezing or rhonchi  ABDOMEN: Soft, non-tender, non-distended MUSCULOSKELETAL:  No edema; No deformity  SKIN: Warm and dry NEUROLOGIC:  Alert and oriented x 3 PSYCHIATRIC:  Normal affect   ASSESSMENT:    1. Irregular heart beat   2. CAD in native artery   3. Type 2 diabetes mellitus without complication, without long-term current use of insulin ([place])   4. Hyperlipidemia LDL goal <70   5. Preoperative clearance    PLAN:     In order of problems listed above:  Irregular heart beat - Plan: EKG 97-QBHA, Basic Metabolic Panel (BMET), Magnesium, TSH I was unable to see complete biotel records, despite calling all day today.  I have messaged Dr. Claiborne Billings to help with the reading of this monitor.  We will collect a BMP, magnesium, and TSH today.  If he has atrial fibrillation, would recommend anticoagulation.  We discussed anticoagulation options.  Would also obtain an echocardiogram (not yet ordered).  This patients CHA2DS2-VASc Score and unadjusted Ischemic Stroke Rate (% per year) is equal to 4.8 % stroke rate/year from a score of 4 (age, MI, HTN, DM).   CAD in native artery Stable.  He denies chest pain or other anginal symptoms.  He is cleared for surgery from a cardiac perspective.  Type 2 diabetes mellitus without complication, without long-term current use of insulin ([facility]) Stable.  Hyperlipidemia LDL goal <70  Continue statin.  Preoperative evaluation Will await monitor records to send cardiac clearance to [facility].   I will send my note to Dr. Claiborne Billings.  If atrial fibrillation occurred on his monitor, will anticoagulate and obtain an echocardiogram. May also need ischemic evaluation.  Follow-up in 1 month with Dr. Claiborne Billings.   Medication Adjustments/Labs and Tests Ordered: Current medicines are reviewed at length with the patient today.  Concerns regarding medicines are outlined above.  Orders Placed This Encounter  Procedures  . Basic Metabolic Panel (BMET)  . Magnesium  . TSH  . EKG 12-Lead   No orders of the defined types were placed in this encounter.   Signed, Ledora Bottcher, PA  01/29/2018 5:19 PM    [facility]

## 2018-01-29 ENCOUNTER — Encounter: Payer: Self-pay | Admitting: Physician Assistant

## 2018-01-29 ENCOUNTER — Ambulatory Visit: Payer: Medicare HMO | Admitting: Physician Assistant

## 2018-01-29 VITALS — BP 150/80 | HR 58 | Ht 67.0 in | Wt 200.0 lb

## 2018-01-29 DIAGNOSIS — I2510 Atherosclerotic heart disease of native coronary artery without angina pectoris: Secondary | ICD-10-CM

## 2018-01-29 DIAGNOSIS — E119 Type 2 diabetes mellitus without complications: Secondary | ICD-10-CM | POA: Diagnosis not present

## 2018-01-29 DIAGNOSIS — E785 Hyperlipidemia, unspecified: Secondary | ICD-10-CM | POA: Diagnosis not present

## 2018-01-29 DIAGNOSIS — Z01818 Encounter for other preprocedural examination: Secondary | ICD-10-CM | POA: Diagnosis not present

## 2018-01-29 DIAGNOSIS — I499 Cardiac arrhythmia, unspecified: Secondary | ICD-10-CM

## 2018-01-29 NOTE — Patient Instructions (Addendum)
Medication Instructions:  Your physician recommends that you continue on your current medications as directed. Please refer to the Current Medication list given to you today.  If you need a refill on your cardiac medications before your next appointment, please call your pharmacy.  Labwork: Your physician recommends that you return for lab work in: TODAY-BMET, MAG, [place] in our office  Testing/Procedures: NONE   Follow-Up: Your physician recommends that you schedule a follow-up appointment in: 2 [place]  Any Other Special Instructions Will Be Listed Below (If Applicable).

## 2018-01-30 ENCOUNTER — Telehealth: Payer: Self-pay | Admitting: Physician Assistant

## 2018-01-30 LAB — BASIC METABOLIC PANEL
BUN/Creatinine Ratio: 13 (ref 10–24)
BUN: 14 mg/dL (ref 8–27)
CO2: 25 mmol/L (ref 20–29)
Calcium: 9.4 mg/dL (ref 8.6–10.2)
Chloride: 102 mmol/L (ref 96–106)
Creatinine, Ser: 1.04 mg/dL (ref 0.76–1.27)
GFR calc Af Amer: 84 mL/min/{1.73_m2} (ref >59)
GFR calc non Af Amer: 73 mL/min/{1.73_m2} (ref >59)
Glucose: 144 mg/dL — ABNORMAL HIGH (ref 65–99)
Potassium: 5.1 mmol/L (ref 3.5–5.2)
Sodium: 142 mmol/L (ref 134–144)

## 2018-01-30 LAB — TSH: TSH: 0.612 u[IU]/mL (ref 0.450–4.500)

## 2018-01-30 LAB — MAGNESIUM: Magnesium: 2.1 mg/dL (ref 1.6–2.3)

## 2018-01-30 NOTE — Telephone Encounter (Signed)
Tee, please let him know: I discussed your Afib alert with Dr. Claiborne Billings. He would like to wait until we get the final report from your monitor. That will happen a few days after you turn the monitor back in.   Please make sure he has another appt 2 weeks after his monitor is turned in.

## 2018-01-30 NOTE — Telephone Encounter (Signed)
Called patient, advised of note from [place].  Sent message to scheduling to make appointment after date of monitor is returned. Given dates that monitor was placed and advised it was a 30 day monitor.

## 2018-02-03 DIAGNOSIS — R69 Illness, unspecified: Secondary | ICD-10-CM | POA: Diagnosis not present

## 2018-02-04 ENCOUNTER — Other Ambulatory Visit: Payer: Self-pay | Admitting: Neurological Surgery

## 2018-02-26 ENCOUNTER — Ambulatory Visit: Payer: Medicare HMO | Admitting: Cardiology

## 2018-02-26 ENCOUNTER — Encounter: Payer: Self-pay | Admitting: Cardiology

## 2018-02-26 VITALS — BP 143/83 | HR 54 | Ht 67.0 in | Wt 199.4 lb

## 2018-02-26 DIAGNOSIS — I10 Essential (primary) hypertension: Secondary | ICD-10-CM

## 2018-02-26 DIAGNOSIS — I480 Paroxysmal atrial fibrillation: Secondary | ICD-10-CM

## 2018-02-26 DIAGNOSIS — E785 Hyperlipidemia, unspecified: Secondary | ICD-10-CM

## 2018-02-26 DIAGNOSIS — M4802 Spinal stenosis, cervical region: Secondary | ICD-10-CM

## 2018-02-26 DIAGNOSIS — E119 Type 2 diabetes mellitus without complications: Secondary | ICD-10-CM

## 2018-02-26 DIAGNOSIS — I2510 Atherosclerotic heart disease of native coronary artery without angina pectoris: Secondary | ICD-10-CM | POA: Diagnosis not present

## 2018-02-26 DIAGNOSIS — Z9861 Coronary angioplasty status: Secondary | ICD-10-CM

## 2018-02-26 NOTE — Progress Notes (Signed)
02/26/2018 Ryan Wagner   10-Jan-1946  DN:[id number]  Primary Physician Shirline Frees, MD Primary Cardiologist: Dr Claiborne Billings  HPI:  Pleasant 72 y/o male followed by Dr Claiborne Billings with a history of CAD, s/p MI in 2004 treated with an RCA DES, HTN, HLD, NIDDM, and spinal stenosis. He has an Hotel manager that had noted atrial fibrillation and was seen in the office 01/29/18. He was asymptomatic. He already had a Holter in place. TSH was WNL. He returns today to f/u is Holer report which showed PACs, PVCs, 4 bt NSVT, and "brief" runs of PAF. He was aslo noted to have occasional nighttime bradycardia. He is overweight-BMI 30- and snores but denies daytime fatigue or poor sleep.  His HR is usually in the 50's. He denies near syncope or unusual fatigue. His CHADS VASC score is 4 for age, HTN, vascular disease, and DM. He has not had chest pain or unusual dyspnea.  He tells me he will need surgery for spinal stenosis next month.    Current Outpatient Medications  Medication Sig Dispense Refill  . amLODipine-benazepril (LOTREL) 10-20 MG per capsule Take 1 capsule by mouth daily.    . ARGININE PO Take 1 tablet by mouth 2 (two) times daily.     [name] aspirin 81 MG tablet Take 81 mg by mouth daily.    [name] atorvastatin (LIPITOR) 40 MG tablet Take 20 mg by mouth daily.     . Coenzyme Q10 (CO Q 10) 100 MG CAPS Take by mouth daily.    . fish oil-omega-3 fatty acids 1000 MG capsule Take 1 g by mouth daily.     . Liraglutide (VICTOZA) 18 MG/3ML SOLN Inject 1.2 mg into the skin daily.     . metFORMIN (GLUCOPHAGE) 500 MG tablet Take 500 mg by mouth 2 (two) times daily with a meal.    . methocarbamol (ROBAXIN) 500 MG tablet Take 1 tablet (500 mg total) by mouth every 6 (six) hours as needed for muscle spasms. 50 tablet 0  . metoprolol (LOPRESSOR) 50 MG tablet Take 50 mg by mouth 2 (two) times daily.    . Multiple Vitamin (MULTIVITAMIN) tablet Take 1 tablet by mouth daily.     No current facility-administered medications  for this visit.     No Known Allergies  Past Medical History:  Diagnosis Date  . Arthritis    knees, hands  . CAD (coronary artery disease)    a. prior MI in 2004 with DESx3 to RCA  . Complication of anesthesia    had difficulty voiding after hemorrhoidectomy  . Diabetes mellitus without complication ([place])    type 2  . GERD (gastroesophageal reflux disease)    no longer with symptoms  . Headache    as a child  . Hyperlipidemia   . Hypertension   . Sciatica    improved    Social History   Socioeconomic History  . Marital status: Married    Spouse name: Not on file  . Number of children: Not on file  . Years of education: Not on file  . Highest education level: Not on file  Occupational History  . Not on file  Social Needs  . Financial resource strain: Not on file  . Food insecurity:    Worry: Not on file    Inability: Not on file  . Transportation needs:    Medical: Not on file    Non-medical: Not on file  Tobacco Use  . Smoking status: Never Smoker  .  Smokeless tobacco: Never Used  Substance and Sexual Activity  . Alcohol use: Yes    Comment: occasional  . Drug use: No  . Sexual activity: Not on file  Lifestyle  . Physical activity:    Days per week: Not on file    Minutes per session: Not on file  . Stress: Not on file  Relationships  . Social connections:    Talks on phone: Not on file    Gets together: Not on file    Attends religious service: Not on file    Active member of club or organization: Not on file    Attends meetings of clubs or organizations: Not on file    Relationship status: Not on file  . Intimate partner violence:    Fear of current or ex partner: Not on file    Emotionally abused: Not on file    Physically abused: Not on file    Forced sexual activity: Not on file  Other Topics Concern  . Not on file  Social History Narrative  . Not on file     Family History  Problem Relation Age of Onset  . Dementia Mother   .  Thyroid disease Mother   . Alzheimer's disease Father      Review of Systems: General: negative for chills, fever, night sweats or weight changes.  Cardiovascular: negative for chest pain, dyspnea on exertion, edema, orthopnea, palpitations, paroxysmal nocturnal dyspnea or shortness of breath Dermatological: negative for rash Respiratory: negative for cough or wheezing Urologic: negative for hematuria Abdominal: negative for nausea, vomiting, diarrhea, bright red blood per rectum, melena, or hematemesis Neurologic: negative for visual changes, syncope, or dizziness All other systems reviewed and are otherwise negative except as noted above.    Blood pressure (!) 143/83, pulse (!) 54, height '5\' 7"'$  (1.702 m), weight 199 lb 6.4 oz (90.4 kg).  General appearance: alert, cooperative, no distress and mildly obese Neck: no carotid bruit and no JVD Lungs: clear to auscultation bilaterally Heart: regular rate and rhythm and HR 52 Abdomen: soft, non-tender; bowel sounds normal; no masses,  no organomegaly Extremities: no edema Skin: warm and dry Neurologic: Grossly normal   ASSESSMENT AND PLAN:   PAF (paroxysmal atrial fibrillation) (HCC) Pt is having intermittent PAF- asymptomatic. Brief episodes but CHADS VASC=4  CAD S/P percutaneous coronary angioplasty RCA DES 2004-doing well, no angina  Non-insulin dependent type 2 diabetes mellitus ([place]) On Glucophage  Hyperlipidemia LDL goal <70 On statin Rx-followed by PCP  Essential hypertension Controlled  Cervical spinal stenosis Cleared for surgery from a cardiac standpoint. Anticoagulation will be addressed when formal request comes.    PLAN  I discussed his case with Dr Debara Pickett the DOD. We have recommended anticoagulation. He will review his options, (insurance) and let us know which agent he can afford, I discussed the risk and benefits of each in detail with him. I told he could probably stop his ASA once anticoagulation was  started. I did order an echo for LA size.   From a cardiac standpoint he is cleared for neck surgery in November. We will discuss anticoagulation pre and post op once we figure out which agent he'll be on.   Kerin Ransom PA-C 02/26/2018 11:32 AM

## 2018-02-26 NOTE — Patient Instructions (Signed)
Medication Instructions:  You are going to need to start anticoagulation The options are: Xarelto 20 mg daily , Eliquis 5 mg two times daily, or warfarin (Coumadin)  --please call to let us know which you would like to start based on insurance   Testing/Procedures: Your physician has requested that you have an echocardiogram. Echocardiography is a painless test that uses sound waves to create images of your heart. It provides your doctor with information about the size and shape of your heart and how well your heart's chambers and valves are working. This procedure takes approximately one hour. There are no restrictions for this procedure.  This will be done at our [facility] location:  [facility]: 3 months with Dr. Claiborne Billings   Any Other Special Instructions Will Be Listed Below (If Applicable).     If you need a refill on your cardiac medications before your next appointment, please call your pharmacy.

## 2018-02-26 NOTE — Assessment & Plan Note (Signed)
On Glucophage 

## 2018-02-26 NOTE — Assessment & Plan Note (Signed)
Cleared for surgery from a cardiac standpoint. Anticoagulation will be addressed when formal request comes.

## 2018-02-26 NOTE — Assessment & Plan Note (Signed)
On statin Rx- followed by PCP 

## 2018-02-26 NOTE — Assessment & Plan Note (Signed)
Controlled.  

## 2018-02-26 NOTE — Assessment & Plan Note (Signed)
RCA DES 2004-doing well, no angina

## 2018-02-26 NOTE — Assessment & Plan Note (Signed)
Pt is having intermittent PAF- asymptomatic. Brief episodes but CHADS VASC=4

## 2018-02-28 NOTE — Telephone Encounter (Signed)
Duplicate documentation:  Received incoming page from [place] regarding pt going into brief new onset atrial flutter with rate control early this AM approximately 0345am. Representative states that the patient is now in NSR. Contacted the patient who is asymptomatic. He states that he was initially notified of possible AF by his Apple device on 10/01/17 and again on 11/23/17. He then called our office to try to schedule a close appointment without success. He proceeded to follow with his PCP who set him up with an EM. He has an appointment with our office for follow up this week on 01/29/18. I spoke with DOD secondary for the need for anticoagulation who states that this can wait for initiation at follow up visit. Will call pt back with updated plan.   [name] NP-C [place] Pager: [phone]

## 2018-03-24 ENCOUNTER — Ambulatory Visit (HOSPITAL_COMMUNITY): Payer: Medicare HMO | Attending: Cardiology

## 2018-03-24 ENCOUNTER — Other Ambulatory Visit: Payer: Self-pay

## 2018-03-24 DIAGNOSIS — I480 Paroxysmal atrial fibrillation: Secondary | ICD-10-CM | POA: Insufficient documentation

## 2018-04-04 NOTE — Pre-Procedure Instructions (Addendum)
Ryan Wagner  04/04/2018      [facility] [address], [place] [address] [place] [place] [address] Phone: [phone] Fax: [phone]    Your procedure is scheduled on Tuesday November 19.  Report to [facility] Admitting at 5:30 A.M.  Call this number if you have problems the morning of surgery:  [phone]   Remember:  Do not eat or drink after midnight.    Take these medicines the morning of surgery with A SIP OF WATER:   Metoprolol (lopressor) Esomeprazole (Nexium)  DO NOT TAKE Metformin (Glucophage) the day of surgery  DO NOT TAKE Liraglutide (Victoza) the day of surgery  7 days prior to surgery STOP taking any Aspirin(unless otherwise instructed by your surgeon), Aleve, Naproxen, Ibuprofen, Motrin, Advil, Goody's, BC's, all herbal medications, fish oil, and all vitamins     How to Manage Your Diabetes Before and After Surgery  Why is it important to control my blood sugar before and after surgery? . Improving blood sugar levels before and after surgery helps healing and can limit problems. . A way of improving blood sugar control is eating a healthy diet by: o  Eating less sugar and carbohydrates o  Increasing activity/exercise o  Talking with your doctor about reaching your blood sugar goals . High blood sugars (greater than 180 mg/dL) can raise your risk of infections and slow your recovery, so you will need to focus on controlling your diabetes during the weeks before surgery. . Make sure that the doctor who takes care of your diabetes knows about your planned surgery including the date and location.  How do I manage my blood sugar before surgery? . Check your blood sugar at least 4 times a day, starting 2 days before surgery, to make sure that the level is not too high or low. o Check your blood sugar the morning of your surgery when you wake up and every 2 hours until you get to the Short Stay  unit. . If your blood sugar is less than 70 mg/dL, you will need to treat for low blood sugar: o Do not take insulin. o Treat a low blood sugar (less than 70 mg/dL) with  cup of clear juice (cranberry or apple), 4 glucose tablets, OR glucose gel. Recheck blood sugar in 15 minutes after treatment (to make sure it is greater than 70 mg/dL). If your blood sugar is not greater than 70 mg/dL on recheck, call [phone] o  for further instructions. . Report your blood sugar to the short stay nurse when you get to Short Stay.  . If you are admitted to the hospital after surgery: o Your blood sugar will be checked by the staff and you will probably be given insulin after surgery (instead of oral diabetes medicines) to make sure you have good blood sugar levels. o The goal for blood sugar control after surgery is 80-180 mg/dL.                 Do not wear jewelry, make-up or nail polish.  Do not wear lotions, powders, or perfumes, or deodorant.  Do not shave 48 hours prior to surgery.  Men may shave face and neck.  Do not bring valuables to the hospital.  [facility] is not responsible for any belongings or valuables.  Contacts, dentures or bridgework may not be worn into surgery.  Leave your suitcase in the car.  After surgery it may be brought  to your room.  For patients admitted to the hospital, discharge time will be determined by your treatment team.  Patients discharged the day of surgery will not be allowed to drive home.   Special instructions:    [facility]- Preparing For Surgery  Before surgery, you can play an important role. Because skin is not sterile, your skin needs to be as free of germs as possible. You can reduce the number of germs on your skin by washing with CHG (chlorahexidine gluconate) Soap before surgery.  CHG is an antiseptic cleaner which kills germs and bonds with the skin to continue killing germs even after washing.    Oral Hygiene is also important  to reduce your risk of infection.  Remember - BRUSH YOUR TEETH THE MORNING OF SURGERY WITH YOUR REGULAR TOOTHPASTE  Please do not use if you have an allergy to CHG or antibacterial soaps. If your skin becomes reddened/irritated stop using the CHG.  Do not shave (including legs and underarms) for at least 48 hours prior to first CHG shower. It is OK to shave your face.  Please follow these instructions carefully.   1. Shower the NIGHT BEFORE SURGERY and the MORNING OF SURGERY with CHG.   2. If you chose to wash your hair, wash your hair first as usual with your normal shampoo.  3. After you shampoo, rinse your hair and body thoroughly to remove the shampoo.  4. Use CHG as you would any other liquid soap. You can apply CHG directly to the skin and wash gently with a scrungie or a clean washcloth.   5. Apply the CHG Soap to your body ONLY FROM THE NECK DOWN.  Do not use on open wounds or open sores. Avoid contact with your eyes, ears, mouth and genitals (private parts). Wash Face and genitals (private parts)  with your normal soap.  6. Wash thoroughly, paying special attention to the area where your surgery will be performed.  7. Thoroughly rinse your body with warm water from the neck down.  8. DO NOT shower/wash with your normal soap after using and rinsing off the CHG Soap.  9. Pat yourself dry with a CLEAN TOWEL.  10. Wear CLEAN PAJAMAS to bed the night before surgery, wear comfortable clothes the morning of surgery  11. Place CLEAN SHEETS on your bed the night of your first shower and DO NOT SLEEP WITH PETS.    Day of Surgery:  Do not apply any deodorants/lotions.  Please wear clean clothes to the hospital/surgery center.   Remember to brush your teeth WITH YOUR REGULAR TOOTHPASTE.    Please read over the following fact sheets that you were given. Coughing and Deep Breathing, MRSA Information and Surgical Site Infection Prevention

## 2018-04-07 ENCOUNTER — Encounter (HOSPITAL_COMMUNITY)
Admission: RE | Admit: 2018-04-07 | Discharge: 2018-04-07 | Disposition: A | Discharge status: Home / Self Care | Payer: Medicare HMO | Source: Ambulatory Visit | Attending: Neurological Surgery | Admitting: Neurological Surgery

## 2018-04-07 ENCOUNTER — Other Ambulatory Visit: Payer: Self-pay

## 2018-04-07 ENCOUNTER — Encounter (HOSPITAL_COMMUNITY): Payer: Self-pay

## 2018-04-07 DIAGNOSIS — E119 Type 2 diabetes mellitus without complications: Secondary | ICD-10-CM | POA: Insufficient documentation

## 2018-04-07 DIAGNOSIS — Z01812 Encounter for preprocedural laboratory examination: Secondary | ICD-10-CM | POA: Insufficient documentation

## 2018-04-07 HISTORY — DX: Unspecified atrial fibrillation: I48.91

## 2018-04-07 LAB — BASIC METABOLIC PANEL
Anion gap: 10 (ref 5–15)
BUN: 11 mg/dL (ref 8–23)
CO2: 22 mmol/L (ref 22–32)
Calcium: 8.7 mg/dL — ABNORMAL LOW (ref 8.9–10.3)
Chloride: 106 mmol/L (ref 98–111)
Creatinine, Ser: 0.77 mg/dL (ref 0.61–1.24)
GFR calc Af Amer: >60 mL/min (ref >60)
GFR calc non Af Amer: >60 mL/min (ref >60)
Glucose, Bld: 105 mg/dL — ABNORMAL HIGH (ref 70–99)
Potassium: 4.1 mmol/L (ref 3.5–5.1)
Sodium: 138 mmol/L (ref 135–145)

## 2018-04-07 LAB — CBC
HCT: 48.9 % (ref 39.0–52.0)
Hemoglobin: 15.8 g/dL (ref 13.0–17.0)
MCH: 28.3 pg (ref 26.0–34.0)
MCHC: 32.3 g/dL (ref 30.0–36.0)
MCV: 87.6 fL (ref 80.0–100.0)
Platelets: 253 10*3/uL (ref 150–400)
RBC: 5.58 MIL/uL (ref 4.22–5.81)
RDW: 13.6 % (ref 11.5–15.5)
WBC: 6.8 10*3/uL (ref 4.0–10.5)
nRBC: 0 % (ref 0.0–0.2)

## 2018-04-07 LAB — SURGICAL PCR SCREEN
MRSA, PCR: NEGATIVE
Staphylococcus aureus: NEGATIVE

## 2018-04-07 LAB — TYPE AND SCREEN
ABO/RH(D): A POS
Antibody Screen: NEGATIVE

## 2018-04-07 LAB — HEMOGLOBIN A1C
Hgb A1c MFr Bld: 5.9 % — ABNORMAL HIGH (ref 4.8–5.6)
Mean Plasma Glucose: 122.63 mg/dL

## 2018-04-07 LAB — GLUCOSE, CAPILLARY: Glucose-Capillary: 119 mg/dL — ABNORMAL HIGH (ref 70–99)

## 2018-04-07 NOTE — Anesthesia Preprocedure Evaluation (Addendum)
Anesthesia Evaluation  Patient identified by MRN, date of birth, ID band Patient awake    Reviewed: Allergy & Precautions, NPO status , Patient's Chart, lab work & pertinent test results, reviewed documented beta blocker date and time   History of Anesthesia Complications Negative for: history of anesthetic complications  Airway Mallampati: II  TM Distance: >3 FB Neck ROM: Full    Dental no notable dental hx.    Pulmonary neg pulmonary ROS,    Pulmonary exam normal breath sounds clear to auscultation       Cardiovascular hypertension, Pt. on medications and Pt. on home beta blockers + CAD, + Past MI and + Cardiac Stents (2004)  Normal cardiovascular exam+ dysrhythmias Atrial Fibrillation  Rhythm:Regular Rate:Bradycardia     Neuro/Psych negative neurological ROS  negative psych ROS   GI/Hepatic Neg liver ROS, GERD  ,  Endo/Other  diabetes  Renal/GU negative Renal ROS  negative genitourinary   Musculoskeletal negative musculoskeletal ROS (+)   Abdominal   Peds  Hematology negative hematology ROS (+)   Anesthesia Other Findings   Reproductive/Obstetrics                           Anesthesia Physical Anesthesia Plan  ASA: III  Anesthesia Plan: General   Post-op Pain Management:    Induction: Intravenous  PONV Risk Score and Plan: 2 and Ondansetron, Dexamethasone and Treatment may vary due to age or medical condition  Airway Management Planned: Oral ETT and Video Laryngoscope Planned  Additional Equipment: None  Intra-op Plan:   Post-operative Plan: Extubation in OR  Informed Consent: I have reviewed the patients History and Physical, chart, labs and discussed the procedure including the risks, benefits and alternatives for the proposed anesthesia with the patient or authorized representative who has indicated his/her understanding and acceptance.     Plan Discussed with:    Anesthesia Plan Comments: Karoline Caldwell, PA-C: "CAD, s/p MI in 2004 treated with an RCA DES. Recent paroxysmal afib. Saw Kerin Ransom, PA-C 02/26/18 and per his note "Cleared for surgery from a cardiac standpoint. Anticoagulation will be addressed when formal request comes." Echo 03/24/18 shows normal LV systolic function EF 0000000; mild diastolic dysfunction; mild RVE.")     Anesthesia Quick Evaluation

## 2018-04-07 NOTE — Progress Notes (Signed)
Pt has hx of small vessel disease with stents in 2004. Pt's cardiologist is Dr. Claiborne Billings, saw Ryan Wagner on 02/26/18 after wearing a holter monitor. Pt had a couple of A-fib runs. Pt is on Aspirin 81 mg, Ryan Wagner did not address this at that appt, in fact mentioned that they would probably start him on an anticoagulation. Pt states that has not happened yet. He's to see Dr. Claiborne Billings in January. He has an appt tomorrow with Dr. Tomasita Crumble for his 6 month check up. I instructed him to ask Dr. [name] about stopping the Aspirin or he may need to call Dr. Evette Georges office. He voiced understanding. Pt is a type 2 diabetic. States his last  A1C was 6 months ago. States it's never been higher than 6.4. Pt states he does not check his blood sugar at home.

## 2018-04-07 NOTE — Pre-Procedure Instructions (Signed)
BOISE FEHRINGER  04/07/2018    Your procedure is scheduled on Tuesday, April 15, 2018 at 7:30 AM.   Report to [facility] Entrance "A" Admitting Office at 5:30 AM.   Call this number if you have problems the morning of surgery: [phone]   Questions prior to day of surgery, please call [phone] between 8 & 4 PM.   Remember:  Do not eat or drink after midnight Monday, 04/14/18.  Take these medicines the morning of surgery with A SIP OF WATER: Esomeprazole (Nexium), Metoprolol (Lopressor)  Stop Aspirin as instructed by surgeon/cardiologist. Stop Multivitamins, Fish Oil and Herbal medications 7 days prior to surgery. Do not use NSAIDS (Ibuprofen, Aleve, etc) 7 days prior to surgery.   Day of surgery do not take Victoza or Metformin.   How to Manage Your Diabetes Before Surgery   Why is it important to control my blood sugar before and after surgery?   Improving blood sugar levels before and after surgery helps healing and can limit problems.  A way of improving blood sugar control is eating a healthy diet by:  - Eating less sugar and carbohydrates  - Increasing activity/exercise  - Talk with your doctor about reaching your blood sugar goals  High blood sugars (greater than 180 mg/dL) can raise your risk of infections and slow down your recovery so you will need to focus on controlling your diabetes during the weeks before surgery.  Make sure that the doctor who takes care of your diabetes knows about your planned surgery including the date and location.  How do I manage my blood sugars before surgery?   Check your blood sugar at least 4 times a day, 2 days before surgery to make sure that they are not too high or low.  Check your blood sugar the morning of your surgery when you wake up and every 2 hours until you get to the Short-Stay unit.  Treat a low blood sugar (less than 70 mg/dL) with 1/2 cup of clear juice (cranberry or apple), 4 glucose tablets,  OR glucose gel.  Recheck blood sugar in 15 minutes after treatment (to make sure it is greater than 70 mg/dL).  If blood sugar is not greater than 70 mg/dL on re-check, call [phone] for further instructions.   Report your blood sugar to the Short-Stay nurse when you get to Short-Stay.  References:  [facility], 2007 "How to Manage your Diabetes Before and After Surgery".    Do not wear jewelry.  Do not wear lotions, powders, cologne or deodorant.  Men may shave face and neck.  Do not bring valuables to the hospital.  [facility] is not responsible for any belongings or valuables.  Contacts, dentures or bridgework may not be worn into surgery.  Leave your suitcase in the car.  After surgery it may be brought to your room.  For patients admitted to the hospital, discharge time will be determined by your treatment team.  [facility] - Preparing for Surgery  Before surgery, you can play an important role.  Because skin is not sterile, your skin needs to be as free of germs as possible.  You can reduce the number of germs on you skin by washing with CHG (chlorahexidine gluconate) soap before surgery.  CHG is an antiseptic cleaner which kills germs and bonds with the skin to continue killing germs even after washing.  Oral Hygiene is also important in reducing the risk of infection.  Remember  to brush your teeth with your regular toothpaste the morning of surgery.  Please DO NOT use if you have an allergy to CHG or antibacterial soaps.  If your skin becomes reddened/irritated stop using the CHG and inform your nurse when you arrive at Short Stay.  Do not shave (including legs and underarms) for at least 48 hours prior to the first CHG shower.  You may shave your face.  Please follow these instructions carefully:   1.  Shower with CHG Soap the night before surgery and the morning of Surgery.  2.  If you choose to wash your hair, wash your hair first as usual  with your normal shampoo.  3.  After you shampoo, rinse your hair and body thoroughly to remove the shampoo. 4.  Use CHG as you would any other liquid soap.  You can apply chg directly to the skin and wash gently with a      scrungie or washcloth.           5.  Apply the CHG Soap to your body ONLY FROM THE NECK DOWN.   Do not use on open wounds or open sores. Avoid contact with your eyes, ears, mouth and genitals (private parts).  Wash genitals (private parts) with your normal soap.  6.  Wash thoroughly, paying special attention to the area where your surgery will be performed.  7.  Thoroughly rinse your body with warm water from the neck down.  8.  DO NOT shower/wash with your normal soap after using and rinsing off the CHG Soap.  9.  Pat yourself dry with a clean towel.            10.  Wear clean pajamas.            11.  Place clean sheets on your bed the night of your first shower and do not sleep with pets.  Day of Surgery  Shower as above. Do not apply any lotions/deodorants the morning of surgery.   Please wear clean clothes to the hospital. Remember to brush your teeth with toothpaste.   Please read over the fact sheets that you were given.

## 2018-04-08 DIAGNOSIS — E782 Mixed hyperlipidemia: Secondary | ICD-10-CM | POA: Diagnosis not present

## 2018-04-08 DIAGNOSIS — I480 Paroxysmal atrial fibrillation: Secondary | ICD-10-CM | POA: Diagnosis not present

## 2018-04-08 DIAGNOSIS — I10 Essential (primary) hypertension: Secondary | ICD-10-CM | POA: Diagnosis not present

## 2018-04-08 DIAGNOSIS — M5412 Radiculopathy, cervical region: Secondary | ICD-10-CM | POA: Diagnosis not present

## 2018-04-08 DIAGNOSIS — Z125 Encounter for screening for malignant neoplasm of prostate: Secondary | ICD-10-CM | POA: Diagnosis not present

## 2018-04-08 DIAGNOSIS — R252 Cramp and spasm: Secondary | ICD-10-CM | POA: Diagnosis not present

## 2018-04-08 DIAGNOSIS — Z0000 Encounter for general adult medical examination without abnormal findings: Secondary | ICD-10-CM | POA: Diagnosis not present

## 2018-04-08 DIAGNOSIS — E119 Type 2 diabetes mellitus without complications: Secondary | ICD-10-CM | POA: Diagnosis not present

## 2018-04-10 DIAGNOSIS — I10 Essential (primary) hypertension: Secondary | ICD-10-CM | POA: Diagnosis not present

## 2018-04-10 DIAGNOSIS — G959 Disease of spinal cord, unspecified: Secondary | ICD-10-CM | POA: Diagnosis not present

## 2018-04-10 DIAGNOSIS — Z6831 Body mass index (BMI) 31.0-31.9, adult: Secondary | ICD-10-CM | POA: Diagnosis not present

## 2018-04-15 ENCOUNTER — Encounter (HOSPITAL_COMMUNITY)
Admission: RE | Disposition: A | Discharge status: Home / Self Care | Payer: Self-pay | Source: Ambulatory Visit | Attending: Neurological Surgery

## 2018-04-15 ENCOUNTER — Ambulatory Visit (HOSPITAL_COMMUNITY): Payer: Medicare HMO | Admitting: Physician Assistant

## 2018-04-15 ENCOUNTER — Observation Stay (HOSPITAL_COMMUNITY)
Admission: RE | Admit: 2018-04-15 | Discharge: 2018-04-16 | Disposition: A | Discharge status: Home / Self Care | Payer: Medicare HMO | Source: Ambulatory Visit | Attending: Neurological Surgery | Admitting: Neurological Surgery

## 2018-04-15 ENCOUNTER — Other Ambulatory Visit: Payer: Self-pay

## 2018-04-15 ENCOUNTER — Ambulatory Visit (HOSPITAL_COMMUNITY): Payer: Medicare HMO

## 2018-04-15 ENCOUNTER — Encounter (HOSPITAL_COMMUNITY): Payer: Self-pay | Admitting: *Deleted

## 2018-04-15 ENCOUNTER — Ambulatory Visit (HOSPITAL_COMMUNITY): Payer: Medicare HMO | Admitting: Certified Registered Nurse Anesthetist

## 2018-04-15 DIAGNOSIS — I2510 Atherosclerotic heart disease of native coronary artery without angina pectoris: Secondary | ICD-10-CM | POA: Diagnosis not present

## 2018-04-15 DIAGNOSIS — Q761 Klippel-Feil syndrome: Secondary | ICD-10-CM | POA: Insufficient documentation

## 2018-04-15 DIAGNOSIS — M4322 Fusion of spine, cervical region: Secondary | ICD-10-CM | POA: Diagnosis not present

## 2018-04-15 DIAGNOSIS — R51 Headache: Secondary | ICD-10-CM | POA: Diagnosis not present

## 2018-04-15 DIAGNOSIS — I10 Essential (primary) hypertension: Secondary | ICD-10-CM | POA: Insufficient documentation

## 2018-04-15 DIAGNOSIS — G9589 Other specified diseases of spinal cord: Secondary | ICD-10-CM | POA: Insufficient documentation

## 2018-04-15 DIAGNOSIS — E785 Hyperlipidemia, unspecified: Secondary | ICD-10-CM | POA: Diagnosis not present

## 2018-04-15 DIAGNOSIS — G959 Disease of spinal cord, unspecified: Secondary | ICD-10-CM | POA: Diagnosis present

## 2018-04-15 DIAGNOSIS — I4891 Unspecified atrial fibrillation: Secondary | ICD-10-CM | POA: Diagnosis not present

## 2018-04-15 DIAGNOSIS — I252 Old myocardial infarction: Secondary | ICD-10-CM | POA: Diagnosis not present

## 2018-04-15 DIAGNOSIS — K219 Gastro-esophageal reflux disease without esophagitis: Secondary | ICD-10-CM | POA: Diagnosis not present

## 2018-04-15 DIAGNOSIS — Z419 Encounter for procedure for purposes other than remedying health state, unspecified: Secondary | ICD-10-CM

## 2018-04-15 DIAGNOSIS — M4802 Spinal stenosis, cervical region: Secondary | ICD-10-CM | POA: Diagnosis not present

## 2018-04-15 DIAGNOSIS — E119 Type 2 diabetes mellitus without complications: Secondary | ICD-10-CM | POA: Insufficient documentation

## 2018-04-15 DIAGNOSIS — M5412 Radiculopathy, cervical region: Secondary | ICD-10-CM | POA: Diagnosis not present

## 2018-04-15 DIAGNOSIS — G992 Myelopathy in diseases classified elsewhere: Secondary | ICD-10-CM | POA: Diagnosis not present

## 2018-04-15 HISTORY — PX: ANTERIOR CERVICAL DECOMP/DISCECTOMY FUSION: SHX1161

## 2018-04-15 LAB — GLUCOSE, CAPILLARY
Glucose-Capillary: 101 mg/dL — ABNORMAL HIGH (ref 70–99)
Glucose-Capillary: 190 mg/dL — ABNORMAL HIGH (ref 70–99)
Glucose-Capillary: 188 mg/dL — ABNORMAL HIGH (ref 70–99)

## 2018-04-15 SURGERY — ANTERIOR CERVICAL DECOMPRESSION/DISCECTOMY FUSION 2 LEVELS
Anesthesia: General | Site: Spine Cervical

## 2018-04-15 MED ORDER — LIDOCAINE-EPINEPHRINE 1 %-1:100000 IJ SOLN
INTRAMUSCULAR | Status: AC
  Filled 2018-04-15: qty 1

## 2018-04-15 MED ORDER — PANTOPRAZOLE SODIUM 40 MG PO TBEC: 40.0000 mg | DELAYED_RELEASE_TABLET | Freq: Every day | ORAL | Status: DC

## 2018-04-15 MED ORDER — LACTATED RINGERS IV SOLN
INTRAVENOUS | Status: DC | PRN
  Administered 2018-04-15 (×2): via INTRAVENOUS

## 2018-04-15 MED ORDER — OXYCODONE HCL 5 MG PO TABS
ORAL_TABLET | ORAL | Status: AC
  Filled 2018-04-15: qty 1

## 2018-04-15 MED ORDER — FENTANYL CITRATE (PF) 100 MCG/2ML IJ SOLN
INTRAMUSCULAR | Status: AC
  Filled 2018-04-15: qty 2

## 2018-04-15 MED ORDER — METFORMIN HCL 500 MG PO TABS
500.0000 mg | ORAL_TABLET | Freq: Two times a day (BID) | ORAL | Status: DC
  Administered 2018-04-15 – 2018-04-16 (×2): 500 mg via ORAL
  Filled 2018-04-15 (×2): qty 1

## 2018-04-15 MED ORDER — OXYCODONE HCL 5 MG PO TABS
10.0000 mg | ORAL_TABLET | ORAL | Status: DC | PRN
  Administered 2018-04-15 – 2018-04-16 (×5): 10 mg via ORAL
  Filled 2018-04-15 (×5): qty 2

## 2018-04-15 MED ORDER — ROCURONIUM BROMIDE 50 MG/5ML IV SOSY
PREFILLED_SYRINGE | INTRAVENOUS | Status: AC
  Filled 2018-04-15: qty 5

## 2018-04-15 MED ORDER — SUGAMMADEX SODIUM 200 MG/2ML IV SOLN
INTRAVENOUS | Status: DC | PRN
  Administered 2018-04-15: 182 mg via INTRAVENOUS

## 2018-04-15 MED ORDER — ACETAMINOPHEN 10 MG/ML IV SOLN
INTRAVENOUS | Status: AC
  Filled 2018-04-15: qty 100

## 2018-04-15 MED ORDER — ATORVASTATIN CALCIUM 20 MG PO TABS
20.0000 mg | ORAL_TABLET | Freq: Every day | ORAL | Status: DC
  Administered 2018-04-15: 20 mg via ORAL
  Filled 2018-04-15 (×2): qty 1

## 2018-04-15 MED ORDER — METOPROLOL TARTRATE 25 MG PO TABS
50.0000 mg | ORAL_TABLET | Freq: Two times a day (BID) | ORAL | Status: DC
  Administered 2018-04-15: 50 mg via ORAL
  Filled 2018-04-15: qty 2

## 2018-04-15 MED ORDER — FENTANYL CITRATE (PF) 100 MCG/2ML IJ SOLN
25.0000 ug | INTRAMUSCULAR | Status: DC | PRN
  Administered 2018-04-15 (×3): 50 ug via INTRAVENOUS

## 2018-04-15 MED ORDER — ONDANSETRON HCL 4 MG/2ML IJ SOLN
INTRAMUSCULAR | Status: AC
  Filled 2018-04-15: qty 2

## 2018-04-15 MED ORDER — THROMBIN 5000 UNITS EX SOLR
CUTANEOUS | Status: AC
  Filled 2018-04-15: qty 5000

## 2018-04-15 MED ORDER — ACETAMINOPHEN 10 MG/ML IV SOLN
INTRAVENOUS | Status: DC | PRN
  Administered 2018-04-15: 1000 mg via INTRAVENOUS

## 2018-04-15 MED ORDER — GLYCOPYRROLATE PF 0.2 MG/ML IJ SOSY
PREFILLED_SYRINGE | INTRAMUSCULAR | Status: AC
  Filled 2018-04-15: qty 1

## 2018-04-15 MED ORDER — ROCURONIUM BROMIDE 50 MG/5ML IV SOSY
PREFILLED_SYRINGE | INTRAVENOUS | Status: DC | PRN
  Administered 2018-04-15: 50 mg via INTRAVENOUS
  Administered 2018-04-15: 20 mg via INTRAVENOUS
  Administered 2018-04-15: 50 mg via INTRAVENOUS
  Administered 2018-04-15: 10 mg via INTRAVENOUS

## 2018-04-15 MED ORDER — LIDOCAINE-EPINEPHRINE 1 %-1:100000 IJ SOLN
INTRAMUSCULAR | Status: DC | PRN
  Administered 2018-04-15: 9 mL

## 2018-04-15 MED ORDER — SUGAMMADEX SODIUM 200 MG/2ML IV SOLN
INTRAVENOUS | Status: AC
  Filled 2018-04-15: qty 2

## 2018-04-15 MED ORDER — SODIUM CHLORIDE 0.9 % IV SOLN: 250.0000 mL | INTRAVENOUS | Status: DC

## 2018-04-15 MED ORDER — AMLODIPINE BESYLATE 5 MG PO TABS
10.0000 mg | ORAL_TABLET | Freq: Every day | ORAL | Status: DC
  Administered 2018-04-15: 10 mg via ORAL
  Filled 2018-04-15: qty 2

## 2018-04-15 MED ORDER — ONDANSETRON HCL 4 MG/2ML IJ SOLN: 4.0000 mg | Freq: Once | INTRAMUSCULAR | Status: DC | PRN

## 2018-04-15 MED ORDER — OXYCODONE HCL 5 MG PO TABS
5.0000 mg | ORAL_TABLET | ORAL | Status: DC | PRN
  Administered 2018-04-15: 5 mg via ORAL

## 2018-04-15 MED ORDER — SODIUM CHLORIDE 0.9% FLUSH: 3.0000 mL | INTRAVENOUS | Status: DC | PRN

## 2018-04-15 MED ORDER — HYDROMORPHONE HCL 1 MG/ML IJ SOLN
0.5000 mg | INTRAMUSCULAR | Status: DC | PRN
  Administered 2018-04-15: 0.5 mg via INTRAVENOUS
  Filled 2018-04-15: qty 0.5

## 2018-04-15 MED ORDER — PROPOFOL 10 MG/ML IV BOLUS
INTRAVENOUS | Status: AC
  Filled 2018-04-15: qty 40

## 2018-04-15 MED ORDER — CHLORHEXIDINE GLUCONATE CLOTH 2 % EX PADS: 6.0000 | MEDICATED_PAD | Freq: Once | CUTANEOUS | Status: DC

## 2018-04-15 MED ORDER — METHOCARBAMOL 500 MG PO TABS
ORAL_TABLET | ORAL | Status: AC
  Administered 2018-04-15: 500 mg
  Filled 2018-04-15: qty 1

## 2018-04-15 MED ORDER — METHOCARBAMOL 500 MG PO TABS
500.0000 mg | ORAL_TABLET | Freq: Four times a day (QID) | ORAL | Status: DC | PRN
  Administered 2018-04-15 – 2018-04-16 (×2): 500 mg via ORAL
  Filled 2018-04-15 (×2): qty 1

## 2018-04-15 MED ORDER — SODIUM CHLORIDE 0.9% FLUSH: 3.0000 mL | Freq: Two times a day (BID) | INTRAVENOUS | Status: DC

## 2018-04-15 MED ORDER — CEFAZOLIN SODIUM-DEXTROSE 2-4 GM/100ML-% IV SOLN
2.0000 g | INTRAVENOUS | Status: AC
  Administered 2018-04-15: 2 g via INTRAVENOUS

## 2018-04-15 MED ORDER — CEFAZOLIN SODIUM-DEXTROSE 2-4 GM/100ML-% IV SOLN
2.0000 g | Freq: Three times a day (TID) | INTRAVENOUS | Status: AC
  Administered 2018-04-15 (×2): 2 g via INTRAVENOUS
  Filled 2018-04-15 (×2): qty 100

## 2018-04-15 MED ORDER — ACETAMINOPHEN 650 MG RE SUPP: 650.0000 mg | RECTAL | Status: DC | PRN

## 2018-04-15 MED ORDER — ONDANSETRON HCL 4 MG/2ML IJ SOLN: 4.0000 mg | Freq: Four times a day (QID) | INTRAMUSCULAR | Status: DC | PRN

## 2018-04-15 MED ORDER — LIDOCAINE 2% (20 MG/ML) 5 ML SYRINGE
INTRAMUSCULAR | Status: AC
  Filled 2018-04-15: qty 5

## 2018-04-15 MED ORDER — OXYCODONE HCL 5 MG PO TABS
5.0000 mg | ORAL_TABLET | Freq: Once | ORAL | Status: AC | PRN
  Administered 2018-04-15: 5 mg via ORAL

## 2018-04-15 MED ORDER — MIDAZOLAM HCL 5 MG/5ML IJ SOLN
INTRAMUSCULAR | Status: DC | PRN
  Administered 2018-04-15: 2 mg via INTRAVENOUS

## 2018-04-15 MED ORDER — KETOROLAC TROMETHAMINE 30 MG/ML IJ SOLN
INTRAMUSCULAR | Status: AC
  Filled 2018-04-15: qty 1

## 2018-04-15 MED ORDER — SODIUM CHLORIDE 0.9 % IV SOLN
INTRAVENOUS | Status: DC | PRN
  Administered 2018-04-15: 10 ug/min via INTRAVENOUS

## 2018-04-15 MED ORDER — FENTANYL CITRATE (PF) 250 MCG/5ML IJ SOLN
INTRAMUSCULAR | Status: AC
  Filled 2018-04-15: qty 5

## 2018-04-15 MED ORDER — ONDANSETRON HCL 4 MG/2ML IJ SOLN
INTRAMUSCULAR | Status: DC | PRN
  Administered 2018-04-15: 4 mg via INTRAVENOUS

## 2018-04-15 MED ORDER — THROMBIN 5000 UNITS EX SOLR
OROMUCOSAL | Status: DC | PRN
  Administered 2018-04-15: 5 mL via TOPICAL

## 2018-04-15 MED ORDER — BENAZEPRIL HCL 20 MG PO TABS
20.0000 mg | ORAL_TABLET | Freq: Every day | ORAL | Status: DC
  Administered 2018-04-15: 20 mg via ORAL
  Filled 2018-04-15 (×2): qty 1

## 2018-04-15 MED ORDER — DEXAMETHASONE SODIUM PHOSPHATE 10 MG/ML IJ SOLN
INTRAMUSCULAR | Status: AC
  Filled 2018-04-15: qty 1

## 2018-04-15 MED ORDER — AMLODIPINE BESY-BENAZEPRIL HCL 10-20 MG PO CAPS: 1.0000 | ORAL_CAPSULE | Freq: Every day | ORAL | Status: DC

## 2018-04-15 MED ORDER — ONDANSETRON HCL 4 MG PO TABS: 4.0000 mg | ORAL_TABLET | Freq: Four times a day (QID) | ORAL | Status: DC | PRN

## 2018-04-15 MED ORDER — MIDAZOLAM HCL 2 MG/2ML IJ SOLN
INTRAMUSCULAR | Status: AC
  Filled 2018-04-15: qty 2

## 2018-04-15 MED ORDER — 0.9 % SODIUM CHLORIDE (POUR BTL) OPTIME
TOPICAL | Status: DC | PRN
  Administered 2018-04-15: 1000 mL

## 2018-04-15 MED ORDER — ACETAMINOPHEN 325 MG PO TABS: 650.0000 mg | ORAL_TABLET | ORAL | Status: DC | PRN

## 2018-04-15 MED ORDER — GLYCOPYRROLATE PF 0.2 MG/ML IJ SOSY
PREFILLED_SYRINGE | INTRAMUSCULAR | Status: DC | PRN
  Administered 2018-04-15: .2 mg via INTRAVENOUS

## 2018-04-15 MED ORDER — OXYCODONE HCL 5 MG/5ML PO SOLN: 5.0000 mg | Freq: Once | ORAL | Status: AC | PRN

## 2018-04-15 MED ORDER — PHENOL 1.4 % MT LIQD: 1.0000 | OROMUCOSAL | Status: DC | PRN

## 2018-04-15 MED ORDER — PROPOFOL 10 MG/ML IV BOLUS
INTRAVENOUS | Status: DC | PRN
  Administered 2018-04-15: 20 mg via INTRAVENOUS
  Administered 2018-04-15: 120 mg via INTRAVENOUS

## 2018-04-15 MED ORDER — SODIUM CHLORIDE 0.9 % IV SOLN
INTRAVENOUS | Status: DC | PRN
  Administered 2018-04-15: 500 mL

## 2018-04-15 MED ORDER — LIDOCAINE 2% (20 MG/ML) 5 ML SYRINGE
INTRAMUSCULAR | Status: DC | PRN
  Administered 2018-04-15: 100 mg via INTRAVENOUS

## 2018-04-15 MED ORDER — MENTHOL 3 MG MT LOZG
1.0000 | LOZENGE | OROMUCOSAL | Status: DC | PRN
  Filled 2018-04-15: qty 9

## 2018-04-15 MED ORDER — DEXAMETHASONE SODIUM PHOSPHATE 10 MG/ML IJ SOLN
INTRAMUSCULAR | Status: DC | PRN
  Administered 2018-04-15: 5 mg via INTRAVENOUS

## 2018-04-15 MED ORDER — FENTANYL CITRATE (PF) 100 MCG/2ML IJ SOLN
INTRAMUSCULAR | Status: DC | PRN
  Administered 2018-04-15: 50 ug via INTRAVENOUS
  Administered 2018-04-15: 150 ug via INTRAVENOUS
  Administered 2018-04-15: 50 ug via INTRAVENOUS

## 2018-04-15 SURGICAL SUPPLY — 57 items
BAG DECANTER FOR FLEXI CONT (MISCELLANEOUS) ×2 IMPLANT
BLADE CLIPPER SURG (BLADE) IMPLANT
BLADE SURG 11 STRL SS (BLADE) ×2 IMPLANT
BUR MATCHSTICK NEURO 3.0 LAGG (BURR) ×2 IMPLANT
CANISTER SUCT 3000ML PPV (MISCELLANEOUS) ×2 IMPLANT
COVER WAND RF STERILE (DRAPES) ×2 IMPLANT
DECANTER SPIKE VIAL GLASS SM (MISCELLANEOUS) ×2 IMPLANT
DERMABOND ADVANCED (GAUZE/BANDAGES/DRESSINGS) ×1
DERMABOND ADVANCED .7 DNX12 (GAUZE/BANDAGES/DRESSINGS) ×1 IMPLANT
DRAPE C-ARM 42X72 X-RAY (DRAPES) ×4 IMPLANT
DRAPE HALF SHEET 40X57 (DRAPES) IMPLANT
DRAPE LAPAROTOMY 100X72 PEDS (DRAPES) ×2 IMPLANT
DRAPE MICROSCOPE LEICA (MISCELLANEOUS) ×2 IMPLANT
DURAPREP 6ML APPLICATOR 50/CS (WOUND CARE) ×2 IMPLANT
ELECT COATED BLADE 2.86 ST (ELECTRODE) ×2 IMPLANT
ELECT REM PT RETURN 9FT ADLT (ELECTROSURGICAL) ×2
ELECTRODE REM PT RTRN 9FT ADLT (ELECTROSURGICAL) ×1 IMPLANT
FLOSEAL 5ML (HEMOSTASIS) IMPLANT
GAUZE 4X4 16PLY RFD (DISPOSABLE) IMPLANT
GLOVE BIO SURGEON STRL SZ7.5 (GLOVE) ×2 IMPLANT
GLOVE BIOGEL PI IND STRL 6.5 (GLOVE) ×1 IMPLANT
GLOVE BIOGEL PI IND STRL 7.5 (GLOVE) ×1 IMPLANT
GLOVE BIOGEL PI INDICATOR 6.5 (GLOVE) ×1
GLOVE BIOGEL PI INDICATOR 7.5 (GLOVE) ×1
GLOVE EXAM NITRILE LRG STRL (GLOVE) IMPLANT
GLOVE EXAM NITRILE XL STR (GLOVE) IMPLANT
GLOVE EXAM NITRILE XS STR PU (GLOVE) IMPLANT
GLOVE SS BIOGEL STRL SZ 6.5 (GLOVE) ×3 IMPLANT
GLOVE SUPERSENSE BIOGEL SZ 6.5 (GLOVE) ×3
GOWN STRL REUS W/ TWL LRG LVL3 (GOWN DISPOSABLE) ×2 IMPLANT
GOWN STRL REUS W/ TWL XL LVL3 (GOWN DISPOSABLE) IMPLANT
GOWN STRL REUS W/TWL 2XL LVL3 (GOWN DISPOSABLE) IMPLANT
GOWN STRL REUS W/TWL LRG LVL3 (GOWN DISPOSABLE) ×4
GOWN STRL REUS W/TWL XL LVL3 (GOWN DISPOSABLE)
KIT BASIN OR (CUSTOM PROCEDURE TRAY) ×2 IMPLANT
KIT TURNOVER KIT B (KITS) ×2 IMPLANT
NEEDLE HYPO 22GX1.5 SAFETY (NEEDLE) ×2 IMPLANT
NEEDLE SPNL 18GX3.5 QUINCKE PK (NEEDLE) ×2 IMPLANT
NS IRRIG 1000ML POUR BTL (IV SOLUTION) ×2 IMPLANT
PACK LAMINECTOMY NEURO (CUSTOM PROCEDURE TRAY) ×2 IMPLANT
PAD ARMBOARD 7.5X6 YLW CONV (MISCELLANEOUS) ×8 IMPLANT
PIN DISTRACTION 14MM (PIN) ×4 IMPLANT
PLATE VISION ELITE 40MM (Plate) ×2 IMPLANT
RUBBERBAND STERILE (MISCELLANEOUS) ×4 IMPLANT
SCREW SELF TAP VAR 4.0X13 (Screw) ×12 IMPLANT
SPACER BONE CORNERSTONE 5X14 (Orthopedic Implant) ×2 IMPLANT
SPACER BONE CORNERSTONE 6X14 (Orthopedic Implant) ×2 IMPLANT
SPONGE INTESTINAL PEANUT (DISPOSABLE) ×2 IMPLANT
SPONGE SURGIFOAM ABS GEL SZ50 (HEMOSTASIS) IMPLANT
STAPLER VISISTAT 35W (STAPLE) IMPLANT
SUT MNCRL AB 3-0 PS2 18 (SUTURE) ×2 IMPLANT
SUT VIC AB 3-0 SH 8-18 (SUTURE) ×4 IMPLANT
TAPE CLOTH 3X10 TAN LF (GAUZE/BANDAGES/DRESSINGS) ×2 IMPLANT
TOWEL GREEN STERILE (TOWEL DISPOSABLE) ×2 IMPLANT
TOWEL GREEN STERILE FF (TOWEL DISPOSABLE) ×2 IMPLANT
TRAY CATH 16FR W/PLASTIC CATH (SET/KITS/TRAYS/PACK) ×2 IMPLANT
WATER STERILE IRR 1000ML POUR (IV SOLUTION) ×2 IMPLANT

## 2018-04-15 NOTE — Anesthesia Postprocedure Evaluation (Signed)
Anesthesia Post Note  Patient: Ryan Wagner  Procedure(s) Performed: Cervical Four-Five Cervical Five-Six Anterior cervical decompression/discectomy/fusion (N/A Spine Cervical)     Patient location during evaluation: PACU Anesthesia Type: General Level of consciousness: awake and alert Pain management: pain level controlled Vital Signs Assessment: post-procedure vital signs reviewed and stable Respiratory status: spontaneous breathing, nonlabored ventilation and respiratory function stable Cardiovascular status: blood pressure returned to baseline and stable Postop Assessment: no apparent nausea or vomiting Anesthetic complications: no    Last Vitals:  Vitals:   04/15/18 1355 04/15/18 1410  BP: (!) 141/81 (!) 150/78  Pulse: 72 67  Resp: 12 20  Temp:  36.5 C  SpO2: 95% 95%    Last Pain:  Vitals:   04/15/18 1410  TempSrc: Oral  PainSc:                  Lidia Collum

## 2018-04-15 NOTE — H&P (Signed)
Surgical H&P Update  HPI: 72 y.o. man with cervical radiculopathy and myelopathy, here for ACDF. MRI showed foraminal stenosis, klippel feil, and cervical stenosis with cord signal change. No changes in health since she was last seen. Still having left arm pain and myelopathic symptoms and wishes to proceed with surgery.   PMHx:  Past Medical History:  Diagnosis Date  . Arthritis    knees, hands  . Atrial fibrillation ([place])   . CAD (coronary artery disease)    a. prior MI in 2004 with DESx3 to RCA  . Complication of anesthesia    had difficulty voiding after hemorrhoidectomy  . Diabetes mellitus without complication ([place])    type 2  . GERD (gastroesophageal reflux disease)    no longer with symptoms  . Headache    migraines as a child  . Hyperlipidemia   . Hypertension   . Sciatica    improved   FamHx:  Family History  Problem Relation Age of Onset  . Dementia Mother   . Thyroid disease Mother   . Dementia Father   . High Cholesterol Father    SocHx:  reports that he has never smoked. He has never used smokeless tobacco. He reports that he drinks alcohol. He reports that he does not use drugs.  Physical Exam: AOx3, PERRL, FS, TM  Strength 5/5 x4, SILTx4, +hoffman's  Assesment/Plan: 72 y.o. man with cervical central canal and foraminal stenosis, here for 2 level ACDF. Risks, benefits, and alternatives discussed and the patient would like to continue with surgery. -OR today -3C post-op  Judith Part, MD 04/15/18 7:32 AM

## 2018-04-15 NOTE — Transfer of Care (Signed)
Immediate Anesthesia Transfer of Care Note  Patient: Ryan Wagner  Procedure(s) Performed: Cervical Four-Five Cervical Five-Six Anterior cervical decompression/discectomy/fusion (N/A Spine Cervical)  Patient Location: PACU  Anesthesia Type:General  Level of Consciousness: awake, alert  and oriented  Airway & Oxygen Therapy: Patient Spontanous Breathing and Patient connected to face mask oxygen  Post-op Assessment: Report given to RN and Post -op Vital signs reviewed and stable  Post vital signs: Reviewed and stable  Last Vitals:  Vitals Value Taken Time  BP 129/75 04/15/2018 11:55 AM  Temp    Pulse 77 04/15/2018 12:02 PM  Resp 14 04/15/2018 12:02 PM  SpO2 94 % 04/15/2018 12:02 PM  Vitals shown include unvalidated device data.  Last Pain:  Vitals:   04/15/18 1155  PainSc: 0-No pain      Patients Stated Pain Goal: 3 (75/83/07 4600)  Complications: No apparent anesthesia complications

## 2018-04-15 NOTE — Op Note (Signed)
PATIENT: Ryan Wagner  DATE:04/15/18   PRE-OPERATIVE DIAGNOSIS:  Cervical radiculopathy, cervical myelopathy, Klippel Feil syndrome   POST-OPERATIVE DIAGNOSIS:  Cervical radiculopathy, cervical myelopathy, Klippel Feil syndrome   PROCEDURE:  C4-C5, C5-C6 Anterior Cervical Discectomy and Instrumented Fusion   SURGEON:  Surgeon(s) and Role:    Judith Part, MD - Primary    Consuella Lose, MD - Assisting   ANESTHESIA: ETGA   BRIEF HISTORY: This is a 72yo man who presented with left upper extremity radicular pain. The patient was found to have matching foraminal stenosis at C5-C6 on the left. However, his MRI was also notable for cord signal change and he was starting to have some symptoms of myelopathy. This was discussed with the patient as well as risks, benefits, and alternatives and the patient wished to proceed with surgical correction.   OPERATIVE DETAIL: The patient was taken to the operating room and placed on the OR table in the supine position. A formal time out was performed with two patient identifiers and confirmed the operative site. Anesthesia was induced by the anesthesia team.  Fluoroscopy was used to localize the surgical level and an incision was marked in a skin crease. The area was then prepped and draped in a sterile fashion. A transverse linear incision was made on the right side of the neck. The platysma was divided medial to the sternocleidomastoid muscle. The carotid sheath was palpated, identified, and retracted laterally with the sternocleidomastoid muscle. The strap muscles were identified and retracted medially and the pretracheal fascia was entered. A bent spinal needle was used with fluoroscopy to localize the surgical level after dissection. The longus colli were elevated bilaterally and a C4-5 discectomy was performed with bilateral foraminotomies. After thorough decompression of the bilateral foramina was obtained without any palpable evidence of  residual stenosis, a 37mm cortical allograft (Medtronic) was inserted into the disc space as an interbody graft. A discectomy was then performed at C5-C6 with bilateral foraminotomies and a 56mm allograft (Medtronic) was inserted. An anterior plate (Medtronic) was positioned and the location was confirmed with fluoroscopy. Six, 71mm screws were used to secure the plate to the C4, C5, and C6 vertebral bodies. Position was confirmed on fluoroscopy, hemostasis was obtained, and the incision was closed in layers. All instrument and sponge counts were correct. The patient was then returned to anesthesia for emergence. No apparent complications at the completion of the procedure.   EBL:  13mL   DRAINS: none   SPECIMENS: none   Judith Part, MD @TODAY @ 11:42 AM

## 2018-04-15 NOTE — Brief Op Note (Signed)
04/15/2018  11:41 AM  PATIENT:  Ryan Wagner  72 y.o. male  PRE-OPERATIVE DIAGNOSIS:  Cervical radiculopathy  POST-OPERATIVE DIAGNOSIS:  Cervical Radiculopathy  PROCEDURE:  Procedure(s) with comments: Cervical Four-Five Cervical Five-Six Anterior cervical decompression/discectomy/fusion (N/A) - anterior approach  SURGEON:  Surgeon(s) and Role:    * Isack Lavalley, Joyice Faster, MD - Primary Consuella Lose, MD - Assistant  ANESTHESIA:   general  EBL:  50 mL   BLOOD ADMINISTERED:none  DRAINS: none   LOCAL MEDICATIONS USED:  LIDOCAINE   SPECIMEN:  No Specimen  DISPOSITION OF SPECIMEN:  N/A  COUNTS:  YES  TOURNIQUET:  * No tourniquets in log *  DICTATION: .Note written in EPIC  PLAN OF CARE: Admit for overnight observation  PATIENT DISPOSITION:  PACU - hemodynamically stable.   Delay start of Pharmacological VTE agent (>24hrs) due to surgical blood loss or risk of bleeding: yes

## 2018-04-15 NOTE — Anesthesia Procedure Notes (Signed)
Procedure Name: Intubation Date/Time: 04/15/2018 7:38 AM Performed by: Alain Marion, CRNA Pre-anesthesia Checklist: Patient identified, Emergency Drugs available, Suction available and Patient being monitored Patient Re-evaluated:Patient Re-evaluated prior to induction Oxygen Delivery Method: Circle System Utilized Preoxygenation: Pre-oxygenation with 100% oxygen Induction Type: IV induction Ventilation: Mask ventilation without difficulty Laryngoscope Size: Glidescope and 4 Tube type: Oral Tube size: 7.5 mm Number of attempts: 1 Airway Equipment and Method: Stylet and Video-laryngoscopy (Head and neck maintained neutral during intubation) Placement Confirmation: ETT inserted through vocal cords under direct vision,  positive ETCO2 and breath sounds checked- equal and bilateral Secured at: 21 cm Tube secured with: Tape Dental Injury: Teeth and Oropharynx as per pre-operative assessment

## 2018-04-16 ENCOUNTER — Encounter (HOSPITAL_COMMUNITY): Payer: Self-pay | Admitting: Neurological Surgery

## 2018-04-16 DIAGNOSIS — M5412 Radiculopathy, cervical region: Secondary | ICD-10-CM | POA: Diagnosis not present

## 2018-04-16 LAB — GLUCOSE, CAPILLARY: Glucose-Capillary: 115 mg/dL — ABNORMAL HIGH (ref 70–99)

## 2018-04-16 MED ORDER — OXYCODONE HCL 5 MG PO TABS: 5.0000 mg | ORAL_TABLET | ORAL | 0 refills | Status: DC | PRN

## 2018-04-16 NOTE — Discharge Summary (Signed)
Discharge Summary  Date of Admission: 04/15/2018  Date of Discharge: 04/16/18  Attending Physician: Emelda Brothers, MD  Hospital Course: Patient was admitted following an uncomplicated 2 level ACDF. He was recovered in PACU and transferred to [facility]. His hospital course was uncomplicated and the patient was discharged home on POD1. He will follow up in clinic with me in 2 weeks.  Neurologic exam at discharge:  AOx3, PERRL, EOMI, FS, TM Strength 5/5 x4, SILTx4  Judith Part, MD 04/16/18 8:30 AM

## 2018-04-16 NOTE — Progress Notes (Signed)
Pt doing well. Pt and wife given D/C instructions with Rx, verbal understanding was provided. Pt's incision is clean and dry with no sign of infection. Pt's IV was removed prior to D/C. Pt D/C'd home via walking @ 0855 per MD order. Pt is stable @ D/C and has no other needs at this time. Holli Humbles, RN

## 2018-04-16 NOTE — Discharge Instructions (Signed)
Discharge Instructions ° °No restriction in activities, slowly increase your activity back to normal.  ° °Your incision is closed with dermabond (purple glue). This will naturally fall off over the next 1-2 weeks.  ° °Okay to shower on the day of discharge. Use regular soap and water and try to be gentle when cleaning your incision.  ° °Follow up with Dr. Dilon Lank in 2 weeks after discharge. If you do not already have a discharge appointment, please call his office at [phone] to schedule a follow up appointment. If you have any concerns or questions, please call the office and let us know. °

## 2018-04-16 NOTE — Evaluation (Signed)
Occupational Therapy Evaluation Patient Details Name: Ryan Wagner MRN: [id number] DOB: 01-07-1946 Today's Date: 04/16/2018    History of Present Illness 72 yo male s/p C4-5, C5-6 ACDF. PMH including CAD, DM type 2, GERD, and HTN.     Clinical Impression   PTA, pt was living with his wife and was independent. Currently, pt performing ADLs and functional mobility with supervision. Provided education handout on cervical precautions, UB ADLs, LB ADLs, grooming, and toileting; pt demonstrated understanding. Answered all pt questions. Recommend dc home once medically stable per physician. All acute OT needs met and will sign off. Thank you.     Follow Up Recommendations  No OT follow up;Supervision/Assistance - 24 hour    Equipment Recommendations  None recommended by OT    Recommendations for Other Services       Precautions / Restrictions Precautions Precautions: Cervical Precaution Booklet Issued: Yes (comment) Precaution Comments: Reviewed cervical precautions Required Braces or Orthoses: Other Brace/Splint Other Brace/Splint: No brace per MD order Restrictions Weight Bearing Restrictions: No      Mobility Bed Mobility               General bed mobility comments: Pt in recliner upon arrival. Reviewed log roll technique and pt verbalized understanding  Transfers Overall transfer level: Needs assistance   Transfers: Sit to/from Stand Sit to Stand: Supervision         General transfer comment: supervision for safety    Balance Overall balance assessment: No apparent balance deficits (not formally assessed)                                         ADL either performed or assessed with clinical judgement   ADL Overall ADL's : Needs assistance/impaired                                       General ADL Comments: Providing education and handout on cervical precautions. Educating pt and wife on compensatory techniques for  grooming, UB dressing, collar management, LB dressing, bathing, and toileting. Pt demonstrating understanding.  Requiring assistance for donning/doffing left socks. Able to bring right ankle to knee and donned pants with supervision.     Vision         Perception     Praxis      Pertinent Vitals/Pain Pain Assessment: Faces Faces Pain Scale: Hurts little more Pain Location: Neck Pain Descriptors / Indicators: Constant;Discomfort Pain Intervention(s): Monitored during session;Repositioned;Premedicated before session     Hand Dominance Right   Extremity/Trunk Assessment Upper Extremity Assessment Upper Extremity Assessment: Overall WFL for tasks assessed;LUE deficits/detail LUE Deficits / Details: Decreased shoulder ROM at LUE due to soreness from surgery LUE Coordination: decreased gross motor   Lower Extremity Assessment Lower Extremity Assessment: Defer to PT evaluation   Cervical / Trunk Assessment Cervical / Trunk Assessment: Other exceptions Cervical / Trunk Exceptions: s/p C4-C5, C5-C6  ACDF   Communication Communication Communication: No difficulties   Cognition Arousal/Alertness: Awake/alert Behavior During Therapy: WFL for tasks assessed/performed Overall Cognitive Status: Within Functional Limits for tasks assessed                                     General Comments  Wife present  throughout session    Exercises     Shoulder Instructions      Home Living Family/patient expects to be discharged to:: Private residence Living Arrangements: Spouse/significant other Available Help at Discharge: Family;Available 24 hours/day Type of Home: House       Home Layout: Two level;Able to live on main level with bedroom/bathroom     Bathroom Shower/Tub: Occupational psychologist: Standard     Home Equipment: Environmental consultant - 2 wheels;Shower seat;Cane - single point          Prior Functioning/Environment Level of Independence: Independent                  OT Problem List: Decreased activity tolerance;Decreased strength;Decreased range of motion;Decreased knowledge of precautions;Pain      OT Treatment/Interventions:      OT Goals(Current goals can be found in the care plan section) Acute Rehab OT Goals Patient Stated Goal: "Go home" OT Goal Formulation: All assessment and education complete, DC therapy  OT Frequency:     Barriers to D/C:            Co-evaluation              AM-PAC PT "6 Clicks" Daily Activity     Outcome Measure Help from another person eating meals?: None Help from another person taking care of personal grooming?: None Help from another person toileting, which includes using toliet, bedpan, or urinal?: None Help from another person bathing (including washing, rinsing, drying)?: None Help from another person to put on and taking off regular upper body clothing?: None Help from another person to put on and taking off regular lower body clothing?: A Little 6 Click Score: 23   End of Session Nurse Communication: Mobility status;Precautions  Activity Tolerance: Patient tolerated treatment well Patient left: in chair;with call bell/phone within reach;with family/visitor present  OT Visit Diagnosis: Muscle weakness (generalized) (M62.81);Pain Pain - Right/Left: (Neck) Pain - part of body: (Neck)                Time: 1683-7290 OT Time Calculation (min): 18 min Charges:  OT General Charges $OT Visit: 1 Visit OT Evaluation $OT Eval Low Complexity: [place], OTR/L Acute Rehab Pager: [phone] Office: [place] 04/16/2018, 8:53 AM

## 2018-04-16 NOTE — Evaluation (Signed)
Physical Therapy Evaluation Patient Details Name: Ryan Wagner MRN: [id number] DOB: 1946/05/03 Today's Date: 04/16/2018   History of Present Illness  72 yo male s/p C4-5, C5-6 ACDF. PMH including CAD, DM type 2, GERD, and HTN.   Clinical Impression  Patient evaluated by Physical Therapy with no further acute PT needs identified. All education has been completed and the patient has no further questions. At the time of PT eval pt was able to perform transfers and ambulation with gross modified independence to supervision for safety. Pt weas educated on car transfer, activity progression, precautions, and general safety with home mobility. See below for any follow-up Physical Therapy or equipment needs. PT is signing off. Thank you for this referral.     Follow Up Recommendations No PT follow up;Supervision for mobility/OOB    Equipment Recommendations  None recommended by PT    Recommendations for Other Services       Precautions / Restrictions Precautions Precautions: Cervical Precaution Booklet Issued: Yes (comment) Precaution Comments: Reviewed cervical precautions Required Braces or Orthoses: Other Brace/Splint Other Brace/Splint: No brace per MD order Restrictions Weight Bearing Restrictions: No      Mobility  Bed Mobility               General bed mobility comments: Pt sitting EOB upon arrival. Reviewed log roll technique and pt verbalized understanding  Transfers Overall transfer level: Needs assistance Equipment used: None Transfers: Sit to/from Stand Sit to Stand: Supervision         General transfer comment: supervision for safety. no unsteadiness or LOB noted  Ambulation/Gait Ambulation/Gait assistance: Supervision;Modified independent (Device/Increase time) Gait Distance (Feet): 300 Feet Assistive device: None Gait Pattern/deviations: Step-through pattern;Decreased stride length Gait velocity: Decreased Gait velocity interpretation: 1.31 -  2.62 ft/sec, indicative of limited community ambulator General Gait Details: No assist required. Initially supervision provided for safety however by end of gait training pt at a mod I level.   Stairs Stairs: Yes Stairs assistance: Supervision Stair Management: One rail Right;Step to pattern;Alternating pattern;Forwards Number of Stairs: 10 General stair comments: Alternating ascending, step-to descending  Wheelchair Mobility    Modified Rankin (Stroke Patients Only)       Balance Overall balance assessment: No apparent balance deficits (not formally assessed)                                           Pertinent Vitals/Pain Pain Assessment: Faces Faces Pain Scale: Hurts little more Pain Location: Neck and shoulders Pain Descriptors / Indicators: Constant;Discomfort Pain Intervention(s): Monitored during session    Home Living Family/patient expects to be discharged to:: Private residence Living Arrangements: Spouse/significant other Available Help at Discharge: Family;Available 24 hours/day Type of Home: House Home Access: Stairs to enter Entrance Stairs-Rails: Right Entrance Stairs-Number of Steps: 6 Home Layout: Two level;Able to live on main level with bedroom/bathroom Home Equipment: Gilford Rile - 2 wheels;Shower seat;Cane - single point      Prior Function Level of Independence: Independent               Hand Dominance   Dominant Hand: Right    Extremity/Trunk Assessment   Upper Extremity Assessment Upper Extremity Assessment: Defer to OT evaluation LUE Deficits / Details: Decreased shoulder ROM at LUE due to soreness from surgery LUE Coordination: decreased gross motor    Lower Extremity Assessment Lower Extremity Assessment: RLE deficits/detail RLE Deficits / Details:  Decreased strength at baseline due to previous TKR    Cervical / Trunk Assessment Cervical / Trunk Assessment: Other exceptions Cervical / Trunk Exceptions: s/p  C4-C5, C5-C6  ACDF  Communication   Communication: No difficulties  Cognition Arousal/Alertness: Awake/alert Behavior During Therapy: WFL for tasks assessed/performed Overall Cognitive Status: Within Functional Limits for tasks assessed                                        General Comments General comments (skin integrity, edema, etc.): Wife present throughout session    Exercises     Assessment/Plan    PT Assessment Patent does not need any further PT services  PT Problem List         PT Treatment Interventions      PT Goals (Current goals can be found in the Care Plan section)  Acute Rehab PT Goals Patient Stated Goal: "Go home" PT Goal Formulation: All assessment and education complete, DC therapy    Frequency     Barriers to discharge        Co-evaluation               AM-PAC PT "6 Clicks" Daily Activity  Outcome Measure Difficulty turning over in bed (including adjusting bedclothes, sheets and blankets)?: None Difficulty moving from lying on back to sitting on the side of the bed? : None Difficulty sitting down on and standing up from a chair with arms (e.g., wheelchair, bedside commode, etc,.)?: None Help needed moving to and from a bed to chair (including a wheelchair)?: None Help needed walking in hospital room?: None Help needed climbing 3-5 steps with a railing? : A Little 6 Click Score: 23    End of Session Equipment Utilized During Treatment: Gait belt Activity Tolerance: Patient tolerated treatment well Patient left: in chair;with call bell/phone within reach;with family/visitor present Nurse Communication: Mobility status PT Visit Diagnosis: Pain;Other symptoms and signs involving the nervous system (R29.898) Pain - part of body: (Neck and shoulders)    Time: [id number] PT Time Calculation (min) (ACUTE ONLY): 24 min   Charges:   PT Evaluation $PT Eval Low Complexity: 1 Low PT Treatments $Gait Training: 8-22 mins         Rolinda Roan, PT, DPT Acute Rehabilitation Services Pager: [phone] Office: [phone]   Thelma Comp 04/16/2018, 10:04 AM

## 2018-04-22 ENCOUNTER — Ambulatory Visit: Payer: Medicare HMO | Admitting: Cardiology

## 2018-04-30 DIAGNOSIS — Z6830 Body mass index (BMI) 30.0-30.9, adult: Secondary | ICD-10-CM | POA: Diagnosis not present

## 2018-04-30 DIAGNOSIS — I10 Essential (primary) hypertension: Secondary | ICD-10-CM | POA: Diagnosis not present

## 2018-04-30 DIAGNOSIS — M5412 Radiculopathy, cervical region: Secondary | ICD-10-CM | POA: Diagnosis not present

## 2018-05-08 DIAGNOSIS — R300 Dysuria: Secondary | ICD-10-CM | POA: Diagnosis not present

## 2018-05-11 ENCOUNTER — Other Ambulatory Visit: Payer: Self-pay | Admitting: Cardiology

## 2018-05-11 ENCOUNTER — Telehealth: Payer: Self-pay | Admitting: Cardiology

## 2018-05-11 MED ORDER — APIXABAN 5 MG PO TABS: 5.0000 mg | ORAL_TABLET | Freq: Two times a day (BID) | ORAL | 4 refills | Status: DC

## 2018-05-11 NOTE — Telephone Encounter (Signed)
Patient called on-call answering service stating that he was in atrial fibrillation for prolonged duration of time.  He is recently been evaluated in October 2019 for PAF in which he wore a Holter monitor which showed intermittent bursts of A. fib.  He was seen by Kerin Ransom, PA with plans to eventually start anticoagulation however the patient had a planned cervical spine surgery and therefore anticoagulation was deferred until the postoperative setting.  His surgery went well and he was discharged 04/16/2018 in stable condition.  He reports he has been in atrial fibrillation since yesterday morning, 05/10/2018.  He is asymptomatic without chest pain, shortness of breath or palpitations.  He reports his rate is between 50 and 100 bpm.  I have instructed him to start Eliquis 5 mg twice daily which was sent to his preferred pharmacy and that he is greater than 1 month out from his ACDF surgery.  Reports no history of GI bleeding or hemorrhagic CVA.  His last creatinine level was 0.77 on 04/07/2018.  He was also instructed to stop his ASA.  He has an upcoming follow-up appointment with Dr. Claiborne Billings on 06/05/2018.  I have instructed him to call the office tomorrow morning to see if he can be seen earlier than this date.  I will also send a staff message stating the same.  He was instructed to come to the emergency department if he becomes symptomatic or if his rate sustains higher than 115-120 for prolonged amount of time.  [name] NP-C [place] Pager: [phone]

## 2018-05-29 DIAGNOSIS — M5412 Radiculopathy, cervical region: Secondary | ICD-10-CM | POA: Diagnosis not present

## 2018-06-05 ENCOUNTER — Encounter: Payer: Self-pay | Admitting: Cardiovascular Disease

## 2018-06-05 ENCOUNTER — Ambulatory Visit: Payer: HMO | Admitting: Cardiovascular Disease

## 2018-06-05 VITALS — BP 140/72 | HR 50 | Ht 67.0 in | Wt 196.8 lb

## 2018-06-05 DIAGNOSIS — E119 Type 2 diabetes mellitus without complications: Secondary | ICD-10-CM | POA: Diagnosis not present

## 2018-06-05 DIAGNOSIS — I2510 Atherosclerotic heart disease of native coronary artery without angina pectoris: Secondary | ICD-10-CM | POA: Diagnosis not present

## 2018-06-05 DIAGNOSIS — R9431 Abnormal electrocardiogram [ECG] [EKG]: Secondary | ICD-10-CM | POA: Diagnosis not present

## 2018-06-05 DIAGNOSIS — Z9861 Coronary angioplasty status: Secondary | ICD-10-CM | POA: Diagnosis not present

## 2018-06-05 DIAGNOSIS — I480 Paroxysmal atrial fibrillation: Secondary | ICD-10-CM

## 2018-06-05 DIAGNOSIS — Z7901 Long term (current) use of anticoagulants: Secondary | ICD-10-CM | POA: Diagnosis not present

## 2018-06-05 DIAGNOSIS — I10 Essential (primary) hypertension: Secondary | ICD-10-CM | POA: Diagnosis not present

## 2018-06-05 DIAGNOSIS — E785 Hyperlipidemia, unspecified: Secondary | ICD-10-CM | POA: Diagnosis not present

## 2018-06-05 MED ORDER — ATORVASTATIN CALCIUM 40 MG PO TABS: 40.0000 mg | ORAL_TABLET | Freq: Every day | ORAL | 1 refills | Status: DC

## 2018-06-05 NOTE — H&P (View-Only) (Signed)
Patient ID: [name], male   DOB: April 03, 1946, 73 y.o.   MRN: [id number]    Primary MD: Dr. Ernestine Wagner  HPI: Ryan Wagner is a 73 y.o. male whois a former patient of Dr. Chase Wagner and last saw him in January 2010.  He establish cardiology care with me in February 2017.  He presents to the office today for cardiology evaluation.  Ryan Wagner has a history of CAD and suffered an inferior wall myocardial infarction on 11/21/2002 and 3 drug-eluting stents were placed in the RCA and at that time.He was also found to have  moderate to severe disease in the obtuse marginal system.  He underwent repeat cardiac catheterization by Dr. Rex Wagner which revealed diffuse disease in the circumflex vessel not amenable to percutaneous coronary intervention which medical therapy was recommended.  A nuclear perfusion study was done in October 2007 showed fairly normal perfusion in all myocardial segments.  He initially had been on dual antiplatelet therapy, but his Plavix was stopped 2008.  When he last saw Dr. Rex Wagner in January 2010 he was without recurrent anginal symptoms.  He is followed by Dr. Shirline Wagner for primary medical care.  Additional problems include type 2 diabetes mellitus, hyperlipidemia, hypertension, arthritis.  There is also a history of hiatal hernia with esophageal stricture.  He had undergone left knee replacement surgery.  He had seen Dr. Kenton Wagner last in November 2016.  At that time he was on metoprolol, tartrate 50 Milgram's twice a day, Lotrel 10/20 daily for blood pressure control.  He was on atorvastatin 40 mg for hyperlipidemia.  He has been taking Victoza for his diabetes mellitus.  Laboratory done at that time revealed a hemoglobin of 15.6, hematocrit of 45.6.  Hemoglobin A1c was 5.9.  Lipid studies revealed a QTC 137, TG 1:30, HDL 32, LDL 78, and non-HDL 104.  He had normal renal function with a BUN of 12 and a creatinine of 0.81.  LFTs were normal.  TSH was normal  at 0.90.    Since I last saw him, he has been seen by Ryan Gess, PA-C and Ryan Adas, PA.  He has an apple watch and this past summer he was to potential short periods of atrial fibrillation n which she was asymptomatic by his apple watch.  He wore an event monitor from August 21 through September 19.  The predominant rhythm was sinus rhythm with the fastest heart rate at 120 bpm.  These harborage heartbeat was 63 bpm.  He had rare PVCs with a burden of 3% and very rare PACs less than 1%.  There was one episode of nonsustained VT lasting 4 beats at a heart rate of 106.  He also was found to have several short-lived episodes of PAF with an AF burden of less than 1%.  His slowest heart rate was in atrial fibrillation at 32 bpm.  He was subsequently seen by Ryan Wagner all he was wearing the monitor.  He underwent an echo Doppler study in March 24, 2018 which showed normal systolic function with EF of 55 to 60%.  He did not have any regional wall motion abnormalities.  There was grade 1 diastolic dysfunction and mild dilation of his RV.  Over the past several months he states he has felt well but is watched detected an episode of AF on one occasion in November and then again in December which lasted for approximately 2 days.  On May 11, 2018 he called the answering service and  as noted in telephone message with Ryan Drown, NP, he was asymptomatic without chest pain shortness of breath or palpitations but he reported atrial fibrillation rates in the 50-100 range.  He was started on Eliquis 5 mg twice a day and was instructed to stop his aspirin.  The patient feels well.  He denies chest pain.  He now presents for follow-up evaluation.  Past Medical History:  Diagnosis Date  . Arthritis    knees, hands  . Atrial fibrillation ([place])   . CAD (coronary artery disease)    a. prior MI in 2004 with DESx3 to RCA  . Complication of anesthesia    had difficulty voiding after hemorrhoidectomy  . Diabetes mellitus  without complication ([place])    type 2  . GERD (gastroesophageal reflux disease)    no longer with symptoms  . Headache    migraines as a child  . Hyperlipidemia   . Hypertension   . Sciatica    improved    Past Surgical History:  Procedure Laterality Date  . ADENOIDECTOMY    . ANTERIOR CERVICAL DECOMP/DISCECTOMY FUSION N/A 04/15/2018   Procedure: Cervical Four-Five Cervical Five-Six Anterior cervical decompression/discectomy/fusion;  Surgeon: Ryan Part, MD;  Location: [place];  Service: Neurosurgery;  Laterality: N/A;  anterior approach  . APPENDECTOMY    . BAND HEMORRHOIDECTOMY    . CARDIAC CATHETERIZATION    . COLONOSCOPY    . CORONARY ANGIOPLASTY  2004  . ESOPHAGEAL DILATION    . KNEE SURGERY Left   . LIPOMA EXCISION     neck  . TONSILLECTOMY    . TOTAL KNEE ARTHROPLASTY Left 06/01/2014   Procedure: TOTAL KNEE ARTHROPLASTY;  Surgeon: Ryan Dibble, MD;  Location: [place];  Service: Orthopedics;  Laterality: Left;    No Known Allergies  Current Outpatient Medications  Medication Sig Dispense Refill  . amLODipine-benazepril (LOTREL) 10-20 MG per capsule Take 1 capsule by mouth daily.    [name] apixaban (ELIQUIS) 5 MG TABS tablet Take 1 tablet (5 mg total) by mouth 2 (two) times daily. 120 tablet 4  . atorvastatin (LIPITOR) 40 MG tablet Take 1 tablet (40 mg total) by mouth daily. 90 tablet 1  . Cinnamon 500 MG TABS Take 500 mg by mouth daily.    . Coenzyme Q10 (CO Q 10) 100 MG CAPS Take 100 mg by mouth daily.     [name] esomeprazole (NEXIUM) 20 MG capsule Take 20 mg by mouth daily.     Ryan Wagner Oil 500 MG CAPS Take 500 mg by mouth daily.    . Liraglutide (VICTOZA) 18 MG/3ML SOLN Inject 1.2 mg into the skin daily.     [name] MAGNESIUM GLUCONATE PO Take 150 mg by mouth daily.    . metFORMIN (GLUCOPHAGE) 500 MG tablet Take 500 mg by mouth 2 (two) times daily with a meal.    . methocarbamol (ROBAXIN) 500 MG tablet Take 1 tablet (500 mg total) by mouth every 6 (six) hours as needed for  muscle spasms. (Patient taking differently: Take 500 mg by mouth at bedtime. Pt may have additional dose as needed) 50 tablet 0  . metoprolol (LOPRESSOR) 50 MG tablet Take 50 mg by mouth 2 (two) times daily.    . Multiple Vitamin (MULTIVITAMIN) tablet Take 1 tablet by mouth daily.    . Turmeric 500 MG CAPS Take 500 mg by mouth daily.    [name] L-Arginine 1000 MG TABS Take 1 tablet by mouth 2 (two) times daily. L-Arginine 1000 mg /  L-Citrulline     No current facility-administered medications for this visit.     Social History   Socioeconomic History  . Marital status: Married    Spouse name: Not on file  . Number of children: Not on file  . Years of education: Not on file  . Highest education level: Not on file  Occupational History  . Not on file  Social Needs  . Financial resource strain: Not on file  . Food insecurity:    Worry: Not on file    Inability: Not on file  . Transportation needs:    Medical: Not on file    Non-medical: Not on file  Tobacco Use  . Smoking status: Never Smoker  . Smokeless tobacco: Never Used  Substance and Sexual Activity  . Alcohol use: Yes    Comment: occasional  . Drug use: No  . Sexual activity: Not on file  Lifestyle  . Physical activity:    Days per week: Not on file    Minutes per session: Not on file  . Stress: Not on file  Relationships  . Social connections:    Talks on phone: Not on file    Gets together: Not on file    Attends religious service: Not on file    Active member of club or organization: Not on file    Attends meetings of clubs or organizations: Not on file    Relationship status: Not on file  . Intimate partner violence:    Fear of current or ex partner: Not on file    Emotionally abused: Not on file    Physically abused: Not on file    Forced sexual activity: Not on file  Other Topics Concern  . Not on file  Social History Narrative  . Not on file   Additional social history is notable that he is married since  46.  He has 2 children 5 grandchildren.  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  He attended [employer] for college.  There is no tobacco.  He drinks occasional wine.  He does walk but not regularly.  Family History  Problem Relation Age of Onset  . Dementia Mother   . Thyroid disease Mother   . Dementia Father   . High Cholesterol Father    Additional family history is notable that both parents died at age 26.  His father died of pneumonia.  He has one sister age 60, who is healthy.  His children are age 55 and 62.  ROS General: Negative; No fevers, chills, or night sweats HEENT: Negative; No changes in vision or hearing, sinus congestion, difficulty swallowing Pulmonary: Negative; No cough, wheezing, shortness of breath, hemoptysis Cardiovascular: See HPI: No chest pain, presyncope, syncope, palpatations GI: Negative; No nausea, vomiting, diarrhea, or abdominal pain GU: Negative; No dysuria, hematuria, or difficulty voiding Musculoskeletal: Negative; no myalgias, joint pain, or weakness Hematologic: Negative; no easy bruising, bleeding Endocrine: Negative; no heat/cold intolerance; no diabetes, Neuro: Negative; no changes in balance, headaches Skin: Negative; No rashes or skin lesions Psychiatric: Negative; No behavioral problems, depression Sleep: Negative; No snoring,  daytime sleepiness, hypersomnolence, bruxism, restless legs, hypnogognic hallucinations. Other comprehensive 14 point system review is negative   Physical Exam BP 140/72   Pulse (!) 50   Ht 5' 7" (1.702 m)   Wt 196 lb 12.8 oz (89.3 kg)   BMI 30.82 kg/m    Repeat blood pressure  Wt Readings from Last 3 Encounters:  06/05/18 196 lb 12.8 oz (89.3 kg)  04/15/18 200 lb 11.2 oz (91 kg)  04/07/18 200 lb 11.2 oz (91 kg)     Physical Exam BP 140/72   Pulse (!) 50   Ht 5' 7" (1.702 m)   Wt 196 lb 12.8 oz (89.3 kg)   BMI 30.82 kg/m  General: Alert, oriented, no distress.  Skin: normal turgor,  no rashes, warm and dry HEENT: Normocephalic, atraumatic. Pupils equal round and reactive to light; sclera anicteric; extraocular muscles intact; Fundi ** Nose without nasal septal hypertrophy Mouth/Parynx benign; Mallinpatti scale Neck: No JVD, no carotid bruits; normal carotid upstroke Lungs: clear to ausculatation and percussion; no wheezing or rales Chest wall: without tenderness to palpitation Heart: PMI not displaced, RRR, s1 s2 normal, 1/6 systolic murmur, no diastolic murmur, no rubs, gallops, thrills, or heaves Abdomen: soft, nontender; no hepatosplenomehaly, BS+; abdominal aorta nontender and not dilated by palpation. Back: no CVA tenderness Pulses 2+ Musculoskeletal: full range of motion, normal strength, no joint deformities Extremities: no clubbing cyanosis or edema, Homan's sign negative  Neurologic: grossly nonfocal; Cranial nerves grossly wnl Psychologic: Normal mood and affect    General: Alert, oriented, no distress.  Skin: normal turgor, no rashes, warm and dry HEENT: Normocephalic, atraumatic. Pupils equal round and reactive to light; sclera anicteric; extraocular muscles intact, No lid lag; Nose without nasal septal hypertrophy; Mouth/Parynx benign; Mallinpatti scale 2 Neck: No JVD, no carotid bruits; normal carotid upstroke Lungs: clear to ausculatation and percussion bilaterally; no wheezing or rales, normal inspiratory and expiratory effort Chest wall: without tenderness to palpitation Heart: PMI not displaced, RRR, s1 s2 normal, 1-5/8 systolic murmur along the left sternal border systolic murmur, No diastolic murmur, no rubs, gallops, thrills, or heaves Abdomen: soft, nontender; no hepatosplenomehaly, BS+; abdominal aorta nontender and not dilated by palpation. Back: no CVA tenderness Pulses: 2+  Musculoskeletal: full range of motion, normal strength, no joint deformities Extremities: Pulses 2+, no clubbing cyanosis or edema, Homan's sign negative  Neurologic:  grossly nonfocal; Cranial nerves grossly wnl Psychologic: Normal mood and affect  ECG (independently read by me): Sinus bradycardia at 50 bpm; first-degree AV block.  Q wave in lead III with new inferior and anterolteral T wave abnormalities which are new since his last ECG of 01/29/2018.  February 2017 ECG (independently read by me): Sinus bradycardia 59 bpm.  First-degree AV block with a PR interval at 232 ms.  No significant ST-T changes.   LABS: I personally reviewed the laboratory done at [facility] by Dr Viona Gilmore. Azalia Bilis as noted above.  BMP Latest Ref Rng & Units 06/05/2018 04/07/2018 01/29/2018  Glucose 65 - 99 mg/dL 92 105(H) 144(H)  BUN 8 - 27 mg/dL _0 Creatinine 0.76 - 1.27 mg/dL 0.77 0.77 1.04  BUN/Creat Ratio 10 - 24 17 - 13  Sodium 134 - 144 mmol/L 141 138 142  Potassium 3.5 - 5.2 mmol/L 4.5 4.1 5.1  Chloride 96 - 106 mmol/L 103 106 102  CO2 20 - 29 mmol/L _1 Calcium 8.6 - 10.2 mg/dL 9.2 8.7(L) 9.4     Hepatic Function Latest Ref Rng & Units 06/05/2018  Total Protein 6.0 - 8.5 g/dL 6.2  Albumin 3.5 - 4.8 g/dL 4.2  AST 0 - 40 IU/L 15  ALT 0 - 44 IU/L 24  Alk Phosphatase 39 - 117 IU/L 89  Total Bilirubin 0.0 - 1.2 mg/dL 0.7    CBC Latest Ref Rng & Units 06/05/2018 04/07/2018 06/03/2014  WBC 3.4 - 10.8 x10E3/uL 6.1 6.8 14.5(H)  Hemoglobin 13.0 - 17.7 g/dL 14.6 15.8 12.6(L)  Hematocrit 37.5 - 51.0 % 43.7 48.9 37.0(L)  Platelets 150 - 450 x10E3/uL 236 253 203   Lab Results  Component Value Date   MCV 86 06/05/2018   MCV 87.6 04/07/2018   MCV 85.1 06/03/2014    Lab Results  Component Value Date   TSH 0.997 06/05/2018    BNP No results found for: BNP  ProBNP No results found for: PROBNP   Lipid Panel  No results found for: CHOL, TRIG, HDL, CHOLHDL, VLDL, LDLCALC, LDLDIRECT   RADIOLOGY: No results found.  IMPRESSION:  1. Abnormal EKG   2. Paroxysmal atrial fibrillation (HCC)   3. CAD S/P percutaneous coronary angioplasty   4. Essential  hypertension   5. Hyperlipidemia LDL goal <70   6. Type 2 diabetes mellitus without complication, without long-term current use of insulin (HCC)   7. Anticoagulation adequate     ASSESSMENT AND PLAN: Ryan Wagner is a pleasant 73 year old gentleman who suffered an inferior wall myocardial infarction in 2004 and was treated successfully with insertion of 3 stents in his RCA.  He has been found to have diffuse disease in his circumflex vessel.  His last nuclear perfusion study was in 2007 which showed fairly normal perfusion.  He has a history of hypertension as well as diabetes mellitus.  With reference to his blood pressure he has been on amlodipine/benazepril 10/20 mg daily in addition to metoprolol 50 mg twice a day.  He is diabetic on metformin 500 mg twice a day and Victoza.  He has been asymptomatic with reference to any chest pain or arrhythmia but apparently over the past several months his apple watch has detected asymptomatic atrial fibrillation which has occurred paroxysmally.  He is now been on Eliquis anticoagulation since May 11, 2018.  His echo Doppler study has shown normal systolic function with mild grade 1 diastolic dysfunction and mild RV enlargement.  He has been on atorvastatin 20 mg daily for hyperlipidemia.  On further questioning Ryan Wagner states that previously he never experienced chest pain even when he was found to have significant RCA disease requiring insertion of 3 DES stents in 2004.  His ECG today is worrisome for new T wave inversion inferior and anterolateral leads which was not present on his September 2019 ECG.  These changes are worrisome for ischemia for which he is asymptomatic.  Consequently, I have recommended definitive reevaluation with cardiac catheterization.  He will hold Eliquis for 48 hours prior to the procedure and we will tentatively plan for this catheterization and possible PCI to be performed on June 10, 2018. I have reviewed the risks,  indications, and alternatives to cardiac catheterization, possible angioplasty, and stenting with the patient. Risks include but are not limited to bleeding, infection, vascular injury, stroke, myocardial infection, arrhythmia, kidney injury, radiation-related injury in the case of prolonged fluoroscopy use, emergency cardiac surgery, and death. The patient understands the risks of serious complication is 1-2 in 4332 with diagnostic cardiac cath and 1-2% or less with angioplasty/stenting.  The operative laboratory will be obtained.  Further recommendations will be made upon completion of his catheterization evaluation.  Time spent: 40 minutes  Troy Sine, MD, [facility]  06/06/2018 1:20 PM

## 2018-06-05 NOTE — Patient Instructions (Addendum)
Medication Instructions:  The current medical regimen is effective;  continue present plan and medications.  If you need a refill on your cardiac medications before your next appointment, please call your pharmacy.   Lab work: CBC, BMP, CMET, TSH If you have labs (blood work) drawn today and your tests are completely normal, you will receive your results only by: [name] MyChart Message (if you have MyChart) OR . A paper copy in the mail If you have any lab test that is abnormal or we need to change your treatment, we will call you to review the results.  Testing/Procedures: Your physician has requested that you have a cardiac catheterization. Cardiac catheterization is used to diagnose and/or treat various heart conditions. Doctors may recommend this procedure for a number of different reasons. The most common reason is to evaluate chest pain. Chest pain can be a symptom of coronary artery disease (CAD), and cardiac catheterization can show whether plaque is narrowing or blocking your heart's arteries. This procedure is also used to evaluate the valves, as well as measure the blood flow and oxygen levels in different parts of your heart. For further information please visit [email or web address]. Please follow instruction sheet, as given.  Follow-Up: At [facility], you and your health needs are our priority.  As part of our continuing mission to provide you with exceptional heart care, we have created designated Provider Care Teams.  These Care Teams include your primary Cardiologist (physician) and Advanced Practice Providers (APPs -  Physician Assistants and Nurse Practitioners) who all work together to provide you with the care you need, when you need it. . Follow up with Dr.Kelly after Cath.   Any Other Special Instructions Will Be Listed Below (If Applicable).          [place]  [place] [address] Dept: [phone] Loc: [phone]  Ryan Wagner  06/05/2018  You are scheduled for a Cardiac Catheterization on Tuesday, January 14 with Dr. Shelva Majestic.  1. Please arrive at the [facility] (Main Entrance A) at [facility]: [address] [place], [place] [address] at 5:30 AM (This time is two hours before your procedure to ensure your preparation). Free valet parking service is available.   Special note: Every effort is made to have your procedure done on time. Please understand that emergencies sometimes delay scheduled procedures.  2. Diet: Do not eat solid foods after midnight.  The patient may have clear liquids until 5am upon the day of the procedure.  3. Labs: You will need to have blood drawn today. You do not need to be fasting.  4. Medication instructions in preparation for your procedure:   Contrast Allergy: No  Stop taking Eliquis (Apixiban) on Sunday, January 12.  Do not take Diabetes Med Glucophage (Metformin) on the day of the procedure and HOLD 48 [name], Start back Friday, January 17,  On the morning of your procedure, take your Aspirin and any morning medicines NOT listed above.  You may use sips of water.  5. Plan for one night stay--bring personal belongings. 6. Bring a current list of your medications and current insurance cards. 7. You MUST have a responsible person to drive you home. 8. Someone MUST be with you the first 24 hours after you arrive home or your discharge will be delayed. 9. Please wear clothes that are easy to get on and off and wear slip-on shoes.  Thank you for allowing [place] to care for you!   -- [place]

## 2018-06-05 NOTE — Progress Notes (Signed)
Patient ID: Ryan Wagner, male   DOB: 10/07/1945, 72 y.o.   MRN: [id number]    Primary MD: Dr. William Randall Harris  HPI: Ryan Wagner is a 72 y.o. male whois a former patient of Dr. Alfred Little and last saw him in January 2010.  He establish cardiology care with me in February 2017.  He presents to the office today for cardiology evaluation.  Ryan Wagner has a history of CAD and suffered an inferior wall myocardial infarction on 11/21/2002 and 3 drug-eluting stents were placed in the RCA and at that time.He was also found to have  moderate to severe disease in the obtuse marginal system.  He underwent repeat cardiac catheterization by Dr. Little which revealed diffuse disease in the circumflex vessel not amenable to percutaneous coronary intervention which medical therapy was recommended.  A nuclear perfusion study was done in October 2007 showed fairly normal perfusion in all myocardial segments.  He initially had been on dual antiplatelet therapy, but his Plavix was stopped 2008.  When he last saw Dr. Little in January 2010 he was without recurrent anginal symptoms.  He is followed by Dr. William Harris for primary medical care.  Additional problems include type 2 diabetes mellitus, hyperlipidemia, hypertension, arthritis.  There is also a history of hiatal hernia with esophageal stricture.  He had undergone left knee replacement surgery.  He had seen Dr. Harris last in November 2016.  At that time he was on metoprolol, tartrate 50 Milgram's twice a day, Lotrel 10/20 daily for blood pressure control.  He was on atorvastatin 40 mg for hyperlipidemia.  He has been taking Victoza for his diabetes mellitus.  Laboratory done at that time revealed a hemoglobin of 15.6, hematocrit of 45.6.  Hemoglobin A1c was 5.9.  Lipid studies revealed a QTC 137, TG 1:30, HDL 32, LDL 78, and non-HDL 104.  He had normal renal function with a BUN of 12 and a creatinine of 0.81.  LFTs were normal.  TSH was normal  at 0.90.    Since I last saw him, he has been seen by [place], PA-C and Angie Duke, PA.  He has an apple watch and this past summer he was to potential short periods of atrial fibrillation n which she was asymptomatic by his apple watch.  He wore an event monitor from August 21 through September 19.  The predominant rhythm was sinus rhythm with the fastest heart rate at 120 bpm.  These harborage heartbeat was 63 bpm.  He had rare PVCs with a burden of 3% and very rare PACs less than 1%.  There was one episode of nonsustained VT lasting 4 beats at a heart rate of 106.  He also was found to have several short-lived episodes of PAF with an AF burden of less than 1%.  His slowest heart rate was in atrial fibrillation at 32 bpm.  He was subsequently seen by Angie Duke all he was wearing the monitor.  He underwent an echo Doppler study in March 24, 2018 which showed normal systolic function with EF of 55 to 60%.  He did not have any regional wall motion abnormalities.  There was grade 1 diastolic dysfunction and mild dilation of his RV.  Over the past several months he states he has felt well but is watched detected an episode of AF on one occasion in November and then again in December which lasted for approximately 2 days.  On May 11, 2018 he called the answering service and   as noted in telephone message with [name], NP, he was asymptomatic without chest pain shortness of breath or palpitations but he reported atrial fibrillation rates in the 50-100 range.  He was started on Eliquis 5 mg twice a day and was instructed to stop his aspirin.  The patient feels well.  He denies chest pain.  He now presents for follow-up evaluation.  Past Medical History:  Diagnosis Date  . Arthritis    knees, hands  . Atrial fibrillation ([place])   . CAD (coronary artery disease)    a. prior MI in 2004 with DESx3 to RCA  . Complication of anesthesia    had difficulty voiding after hemorrhoidectomy  . Diabetes mellitus  without complication ([place])    type 2  . GERD (gastroesophageal reflux disease)    no longer with symptoms  . Headache    migraines as a child  . Hyperlipidemia   . Hypertension   . Sciatica    improved    Past Surgical History:  Procedure Laterality Date  . ADENOIDECTOMY    . ANTERIOR CERVICAL DECOMP/DISCECTOMY FUSION N/A 04/15/2018   Procedure: Cervical Four-Five Cervical Five-Six Anterior cervical decompression/discectomy/fusion;  Surgeon: Judith Part, MD;  Location: [facility];  Service: Neurosurgery;  Laterality: N/A;  anterior approach  . APPENDECTOMY    . BAND HEMORRHOIDECTOMY    . CARDIAC CATHETERIZATION    . COLONOSCOPY    . CORONARY ANGIOPLASTY  2004  . ESOPHAGEAL DILATION    . KNEE SURGERY Left   . LIPOMA EXCISION     neck  . TONSILLECTOMY    . TOTAL KNEE ARTHROPLASTY Left 06/01/2014   Procedure: TOTAL KNEE ARTHROPLASTY;  Surgeon: Hessie Dibble, MD;  Location: [facility];  Service: Orthopedics;  Laterality: Left;    No Known Allergies  Current Outpatient Medications  Medication Sig Dispense Refill  . amLODipine-benazepril (LOTREL) 10-20 MG per capsule Take 1 capsule by mouth daily.    [name] apixaban (ELIQUIS) 5 MG TABS tablet Take 1 tablet (5 mg total) by mouth 2 (two) times daily. 120 tablet 4  . atorvastatin (LIPITOR) 40 MG tablet Take 1 tablet (40 mg total) by mouth daily. 90 tablet 1  . Cinnamon 500 MG TABS Take 500 mg by mouth daily.    . Coenzyme Q10 (CO Q 10) 100 MG CAPS Take 100 mg by mouth daily.     [name] esomeprazole (NEXIUM) 20 MG capsule Take 20 mg by mouth daily.     [name] 500 MG CAPS Take 500 mg by mouth daily.    . Liraglutide (VICTOZA) 18 MG/3ML SOLN Inject 1.2 mg into the skin daily.     [name] MAGNESIUM GLUCONATE PO Take 150 mg by mouth daily.    . metFORMIN (GLUCOPHAGE) 500 MG tablet Take 500 mg by mouth 2 (two) times daily with a meal.    . methocarbamol (ROBAXIN) 500 MG tablet Take 1 tablet (500 mg total) by mouth every 6 (six) hours as needed for  muscle spasms. (Patient taking differently: Take 500 mg by mouth at bedtime. Pt may have additional dose as needed) 50 tablet 0  . metoprolol (LOPRESSOR) 50 MG tablet Take 50 mg by mouth 2 (two) times daily.    . Multiple Vitamin (MULTIVITAMIN) tablet Take 1 tablet by mouth daily.    . Turmeric 500 MG CAPS Take 500 mg by mouth daily.    [name] L-Arginine 1000 MG TABS Take 1 tablet by mouth 2 (two) times daily. L-Arginine 1000 mg /  L-Citrulline     No current facility-administered medications for this visit.     Social History   Socioeconomic History  . Marital status: Married    Spouse name: Not on file  . Number of children: Not on file  . Years of education: Not on file  . Highest education level: Not on file  Occupational History  . Not on file  Social Needs  . Financial resource strain: Not on file  . Food insecurity:    Worry: Not on file    Inability: Not on file  . Transportation needs:    Medical: Not on file    Non-medical: Not on file  Tobacco Use  . Smoking status: Never Smoker  . Smokeless tobacco: Never Used  Substance and Sexual Activity  . Alcohol use: Yes    Comment: occasional  . Drug use: No  . Sexual activity: Not on file  Lifestyle  . Physical activity:    Days per week: Not on file    Minutes per session: Not on file  . Stress: Not on file  Relationships  . Social connections:    Talks on phone: Not on file    Gets together: Not on file    Attends religious service: Not on file    Active member of club or organization: Not on file    Attends meetings of clubs or organizations: Not on file    Relationship status: Not on file  . Intimate partner violence:    Fear of current or ex partner: Not on file    Emotionally abused: Not on file    Physically abused: Not on file    Forced sexual activity: Not on file  Other Topics Concern  . Not on file  Social History Narrative  . Not on file   Additional social history is notable that he is married since  1972.  He has 2 children 5 grandchildren.  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  He attended [employer] for college.  There is no tobacco.  He drinks occasional wine.  He does walk but not regularly.  Family History  Problem Relation Age of Onset  . Dementia Mother   . Thyroid disease Mother   . Dementia Father   . High Cholesterol Father    Additional family history is notable that both parents died at age 92.  His father died of pneumonia.  He has one sister age 69, who is healthy.  His children are age 39 and 35.  ROS General: Negative; No fevers, chills, or night sweats HEENT: Negative; No changes in vision or hearing, sinus congestion, difficulty swallowing Pulmonary: Negative; No cough, wheezing, shortness of breath, hemoptysis Cardiovascular: See HPI: No chest pain, presyncope, syncope, palpatations GI: Negative; No nausea, vomiting, diarrhea, or abdominal pain GU: Negative; No dysuria, hematuria, or difficulty voiding Musculoskeletal: Negative; no myalgias, joint pain, or weakness Hematologic: Negative; no easy bruising, bleeding Endocrine: Negative; no heat/cold intolerance; no diabetes, Neuro: Negative; no changes in balance, headaches Skin: Negative; No rashes or skin lesions Psychiatric: Negative; No behavioral problems, depression Sleep: Negative; No snoring,  daytime sleepiness, hypersomnolence, bruxism, restless legs, hypnogognic hallucinations. Other comprehensive 14 point system review is negative   Physical Exam BP 140/72   Pulse (!) 50   Ht 5' 7" (1.702 m)   Wt 196 lb 12.8 oz (89.3 kg)   BMI 30.82 kg/m    Repeat blood pressure  Wt Readings from Last 3 Encounters:    06/05/18 196 lb 12.8 oz (89.3 kg)  04/15/18 200 lb 11.2 oz (91 kg)  04/07/18 200 lb 11.2 oz (91 kg)     Physical Exam BP 140/72   Pulse (!) 50   Ht 5' 7" (1.702 m)   Wt 196 lb 12.8 oz (89.3 kg)   BMI 30.82 kg/m  General: Alert, oriented, no distress.  Skin: normal turgor,  no rashes, warm and dry HEENT: Normocephalic, atraumatic. Pupils equal round and reactive to light; sclera anicteric; extraocular muscles intact; Fundi ** Nose without nasal septal hypertrophy Mouth/Parynx benign; Mallinpatti scale Neck: No JVD, no carotid bruits; normal carotid upstroke Lungs: clear to ausculatation and percussion; no wheezing or rales Chest wall: without tenderness to palpitation Heart: PMI not displaced, RRR, s1 s2 normal, 1/6 systolic murmur, no diastolic murmur, no rubs, gallops, thrills, or heaves Abdomen: soft, nontender; no hepatosplenomehaly, BS+; abdominal aorta nontender and not dilated by palpation. Back: no CVA tenderness Pulses 2+ Musculoskeletal: full range of motion, normal strength, no joint deformities Extremities: no clubbing cyanosis or edema, Homan's sign negative  Neurologic: grossly nonfocal; Cranial nerves grossly wnl Psychologic: Normal mood and affect    General: Alert, oriented, no distress.  Skin: normal turgor, no rashes, warm and dry HEENT: Normocephalic, atraumatic. Pupils equal round and reactive to light; sclera anicteric; extraocular muscles intact, No lid lag; Nose without nasal septal hypertrophy; Mouth/Parynx benign; Mallinpatti scale 2 Neck: No JVD, no carotid bruits; normal carotid upstroke Lungs: clear to ausculatation and percussion bilaterally; no wheezing or rales, normal inspiratory and expiratory effort Chest wall: without tenderness to palpitation Heart: PMI not displaced, RRR, s1 s2 normal, 1-2/6 systolic murmur along the left sternal border systolic murmur, No diastolic murmur, no rubs, gallops, thrills, or heaves Abdomen: soft, nontender; no hepatosplenomehaly, BS+; abdominal aorta nontender and not dilated by palpation. Back: no CVA tenderness Pulses: 2+  Musculoskeletal: full range of motion, normal strength, no joint deformities Extremities: Pulses 2+, no clubbing cyanosis or edema, Homan's sign negative  Neurologic:  grossly nonfocal; Cranial nerves grossly wnl Psychologic: Normal mood and affect  ECG (independently read by me): Sinus bradycardia at 50 bpm; first-degree AV block.  Q wave in lead III with new inferior and anterolteral T wave abnormalities which are new since his last ECG of 01/29/2018.  February 2017 ECG (independently read by me): Sinus bradycardia 59 bpm.  First-degree AV block with a PR interval at 232 ms.  No significant ST-T changes.   LABS: I personally reviewed the laboratory done at [facility] by Dr W. Randall Harris as noted above.  BMP Latest Ref Rng & Units 06/05/2018 04/07/2018 01/29/2018  Glucose 65 - 99 mg/dL 92 105(H) 144(H)  BUN 8 - 27 mg/dL 13 11 14  Creatinine 0.76 - 1.27 mg/dL 0.77 0.77 1.04  BUN/Creat Ratio 10 - 24 17 - 13  Sodium 134 - 144 mmol/L 141 138 142  Potassium 3.5 - 5.2 mmol/L 4.5 4.1 5.1  Chloride 96 - 106 mmol/L 103 106 102  CO2 20 - 29 mmol/L 22 22 25  Calcium 8.6 - 10.2 mg/dL 9.2 8.7(L) 9.4     Hepatic Function Latest Ref Rng & Units 06/05/2018  Total Protein 6.0 - 8.5 g/dL 6.2  Albumin 3.5 - 4.8 g/dL 4.2  AST 0 - 40 IU/L 15  ALT 0 - 44 IU/L 24  Alk Phosphatase 39 - 117 IU/L 89  Total Bilirubin 0.0 - 1.2 mg/dL 0.7    CBC Latest Ref Rng & Units 06/05/2018 04/07/2018 06/03/2014    WBC 3.4 - 10.8 x10E3/uL 6.1 6.8 14.5(H)  Hemoglobin 13.0 - 17.7 g/dL 14.6 15.8 12.6(L)  Hematocrit 37.5 - 51.0 % 43.7 48.9 37.0(L)  Platelets 150 - 450 x10E3/uL 236 253 203   Lab Results  Component Value Date   MCV 86 06/05/2018   MCV 87.6 04/07/2018   MCV 85.1 06/03/2014    Lab Results  Component Value Date   TSH 0.997 06/05/2018    BNP No results found for: BNP  ProBNP No results found for: PROBNP   Lipid Panel  No results found for: CHOL, TRIG, HDL, CHOLHDL, VLDL, LDLCALC, LDLDIRECT   RADIOLOGY: No results found.  IMPRESSION:  1. Abnormal EKG   2. Paroxysmal atrial fibrillation (HCC)   3. CAD S/P percutaneous coronary angioplasty   4. Essential  hypertension   5. Hyperlipidemia LDL goal <70   6. Type 2 diabetes mellitus without complication, without long-term current use of insulin (HCC)   7. Anticoagulation adequate     ASSESSMENT AND PLAN: Ryan Wagner is a pleasant 72-year-old gentleman who suffered an inferior wall myocardial infarction in 2004 and was treated successfully with insertion of 3 stents in his RCA.  He has been found to have diffuse disease in his circumflex vessel.  His last nuclear perfusion study was in 2007 which showed fairly normal perfusion.  He has a history of hypertension as well as diabetes mellitus.  With reference to his blood pressure he has been on amlodipine/benazepril 10/20 mg daily in addition to metoprolol 50 mg twice a day.  He is diabetic on metformin 500 mg twice a day and Victoza.  He has been asymptomatic with reference to any chest pain or arrhythmia but apparently over the past several months his apple watch has detected asymptomatic atrial fibrillation which has occurred paroxysmally.  He is now been on Eliquis anticoagulation since May 11, 2018.  His echo Doppler study has shown normal systolic function with mild grade 1 diastolic dysfunction and mild RV enlargement.  He has been on atorvastatin 20 mg daily for hyperlipidemia.  On further questioning Ryan Wagner states that previously he never experienced chest pain even when he was found to have significant RCA disease requiring insertion of 3 DES stents in 2004.  His ECG today is worrisome for new T wave inversion inferior and anterolateral leads which was not present on his September 2019 ECG.  These changes are worrisome for ischemia for which he is asymptomatic.  Consequently, I have recommended definitive reevaluation with cardiac catheterization.  He will hold Eliquis for 48 hours prior to the procedure and we will tentatively plan for this catheterization and possible PCI to be performed on June 10, 2018. I have reviewed the risks,  indications, and alternatives to cardiac catheterization, possible angioplasty, and stenting with the patient. Risks include but are not limited to bleeding, infection, vascular injury, stroke, myocardial infection, arrhythmia, kidney injury, radiation-related injury in the case of prolonged fluoroscopy use, emergency cardiac surgery, and death. The patient understands the risks of serious complication is 1-2 in 1000 with diagnostic cardiac cath and 1-2% or less with angioplasty/stenting.  The operative laboratory will be obtained.  Further recommendations will be made upon completion of his catheterization evaluation.  Time spent: 40 minutes  Clancey Welton A. Kimia Finan, MD, FACC  06/06/2018 1:20 PM    

## 2018-06-06 ENCOUNTER — Encounter: Payer: Self-pay | Admitting: Cardiovascular Disease

## 2018-06-06 LAB — COMPREHENSIVE METABOLIC PANEL
ALT: 24 IU/L (ref 0–44)
AST: 15 IU/L (ref 0–40)
Albumin/Globulin Ratio: 2.1 (ref 1.2–2.2)
Albumin: 4.2 g/dL (ref 3.5–4.8)
Alkaline Phosphatase: 89 IU/L (ref 39–117)
BUN/Creatinine Ratio: 17 (ref 10–24)
BUN: 13 mg/dL (ref 8–27)
Bilirubin Total: 0.7 mg/dL (ref 0.0–1.2)
CO2: 22 mmol/L (ref 20–29)
Calcium: 9.2 mg/dL (ref 8.6–10.2)
Chloride: 103 mmol/L (ref 96–106)
Creatinine, Ser: 0.77 mg/dL (ref 0.76–1.27)
GFR calc Af Amer: 106 mL/min/{1.73_m2} (ref >59)
GFR calc non Af Amer: 92 mL/min/{1.73_m2} (ref >59)
Globulin, Total: 2 g/dL (ref 1.5–4.5)
Glucose: 92 mg/dL (ref 65–99)
Potassium: 4.5 mmol/L (ref 3.5–5.2)
Sodium: 141 mmol/L (ref 134–144)
Total Protein: 6.2 g/dL (ref 6.0–8.5)

## 2018-06-06 LAB — CBC
Hematocrit: 43.7 % (ref 37.5–51.0)
Hemoglobin: 14.6 g/dL (ref 13.0–17.7)
MCH: 28.6 pg (ref 26.6–33.0)
MCHC: 33.4 g/dL (ref 31.5–35.7)
MCV: 86 fL (ref 79–97)
Platelets: 236 10*3/uL (ref 150–450)
RBC: 5.1 x10E6/uL (ref 4.14–5.80)
RDW: 13.9 % (ref 11.6–15.4)
WBC: 6.1 10*3/uL (ref 3.4–10.8)

## 2018-06-06 LAB — TSH: TSH: 0.997 u[IU]/mL (ref 0.450–4.500)

## 2018-06-09 ENCOUNTER — Telehealth: Payer: Self-pay | Admitting: *Deleted

## 2018-06-09 NOTE — Telephone Encounter (Addendum)
Pt contacted pre-catheterization scheduled at [facility] for: Tuesday June 10, 2018 7:30 AM Verified arrival time and place: [facility] Entrance A at: 5:30 AM  No solid food after midnight prior to cath, clear liquids until 5 AM day of procedure. Contrast allergy: no  Hold: Eliquis-06/08/18 until post procedure. Metformin-day of procedure and 48 hours post procedure. Victoza-1/2 usual dose PM prior to procedure.   Except hold medications AM meds can be  taken pre-cath with sip of water including: ASA 81 mg  Confirm patient has responsible person to drive home post procedure and for 24 hours after you arrive home.  I reviewed instructions with patient, he verbalized understanding, thanked me for call.

## 2018-06-10 ENCOUNTER — Inpatient Hospital Stay (HOSPITAL_COMMUNITY)
Admission: AD | Admit: 2018-06-10 | Discharge: 2018-06-17 | DRG: 229 | Disposition: A | Discharge status: Home / Self Care | Payer: HMO | Attending: Cardiothoracic Surgery | Admitting: Cardiothoracic Surgery

## 2018-06-10 ENCOUNTER — Encounter (HOSPITAL_COMMUNITY)
Admission: AD | Disposition: A | Discharge status: Home / Self Care | Payer: Self-pay | Source: Home / Self Care | Attending: Cardiothoracic Surgery

## 2018-06-10 ENCOUNTER — Other Ambulatory Visit: Payer: Self-pay

## 2018-06-10 ENCOUNTER — Encounter (HOSPITAL_COMMUNITY): Payer: Self-pay | Admitting: Cardiovascular Disease

## 2018-06-10 ENCOUNTER — Other Ambulatory Visit: Payer: Self-pay | Admitting: *Deleted

## 2018-06-10 ENCOUNTER — Ambulatory Visit (HOSPITAL_BASED_OUTPATIENT_CLINIC_OR_DEPARTMENT_OTHER): Payer: HMO

## 2018-06-10 ENCOUNTER — Ambulatory Visit (HOSPITAL_COMMUNITY): Payer: HMO

## 2018-06-10 DIAGNOSIS — R110 Nausea: Secondary | ICD-10-CM | POA: Diagnosis present

## 2018-06-10 DIAGNOSIS — Z79899 Other long term (current) drug therapy: Secondary | ICD-10-CM

## 2018-06-10 DIAGNOSIS — E8770 Fluid overload, unspecified: Secondary | ICD-10-CM | POA: Diagnosis not present

## 2018-06-10 DIAGNOSIS — I480 Paroxysmal atrial fibrillation: Secondary | ICD-10-CM | POA: Diagnosis present

## 2018-06-10 DIAGNOSIS — I080 Rheumatic disorders of both mitral and aortic valves: Secondary | ICD-10-CM | POA: Diagnosis not present

## 2018-06-10 DIAGNOSIS — I10 Essential (primary) hypertension: Secondary | ICD-10-CM | POA: Diagnosis present

## 2018-06-10 DIAGNOSIS — K219 Gastro-esophageal reflux disease without esophagitis: Secondary | ICD-10-CM | POA: Diagnosis present

## 2018-06-10 DIAGNOSIS — M1990 Unspecified osteoarthritis, unspecified site: Secondary | ICD-10-CM | POA: Diagnosis present

## 2018-06-10 DIAGNOSIS — Z955 Presence of coronary angioplasty implant and graft: Secondary | ICD-10-CM | POA: Diagnosis not present

## 2018-06-10 DIAGNOSIS — J9811 Atelectasis: Secondary | ICD-10-CM | POA: Diagnosis not present

## 2018-06-10 DIAGNOSIS — Z951 Presence of aortocoronary bypass graft: Secondary | ICD-10-CM | POA: Diagnosis not present

## 2018-06-10 DIAGNOSIS — Z7984 Long term (current) use of oral hypoglycemic drugs: Secondary | ICD-10-CM | POA: Diagnosis not present

## 2018-06-10 DIAGNOSIS — R9431 Abnormal electrocardiogram [ECG] [EKG]: Secondary | ICD-10-CM

## 2018-06-10 DIAGNOSIS — Z96652 Presence of left artificial knee joint: Secondary | ICD-10-CM | POA: Diagnosis present

## 2018-06-10 DIAGNOSIS — R001 Bradycardia, unspecified: Secondary | ICD-10-CM | POA: Diagnosis not present

## 2018-06-10 DIAGNOSIS — I371 Nonrheumatic pulmonary valve insufficiency: Secondary | ICD-10-CM | POA: Diagnosis not present

## 2018-06-10 DIAGNOSIS — E119 Type 2 diabetes mellitus without complications: Secondary | ICD-10-CM | POA: Diagnosis present

## 2018-06-10 DIAGNOSIS — Z01810 Encounter for preprocedural cardiovascular examination: Secondary | ICD-10-CM

## 2018-06-10 DIAGNOSIS — J939 Pneumothorax, unspecified: Secondary | ICD-10-CM | POA: Diagnosis not present

## 2018-06-10 DIAGNOSIS — D62 Acute posthemorrhagic anemia: Secondary | ICD-10-CM | POA: Diagnosis not present

## 2018-06-10 DIAGNOSIS — I2510 Atherosclerotic heart disease of native coronary artery without angina pectoris: Secondary | ICD-10-CM

## 2018-06-10 DIAGNOSIS — I25118 Atherosclerotic heart disease of native coronary artery with other forms of angina pectoris: Secondary | ICD-10-CM | POA: Diagnosis present

## 2018-06-10 DIAGNOSIS — I973 Postprocedural hypertension: Secondary | ICD-10-CM | POA: Diagnosis not present

## 2018-06-10 DIAGNOSIS — Z7901 Long term (current) use of anticoagulants: Secondary | ICD-10-CM | POA: Diagnosis not present

## 2018-06-10 DIAGNOSIS — Z981 Arthrodesis status: Secondary | ICD-10-CM | POA: Diagnosis not present

## 2018-06-10 DIAGNOSIS — Z9689 Presence of other specified functional implants: Secondary | ICD-10-CM

## 2018-06-10 DIAGNOSIS — I2584 Coronary atherosclerosis due to calcified coronary lesion: Secondary | ICD-10-CM | POA: Diagnosis present

## 2018-06-10 DIAGNOSIS — I252 Old myocardial infarction: Secondary | ICD-10-CM | POA: Diagnosis not present

## 2018-06-10 DIAGNOSIS — I25110 Atherosclerotic heart disease of native coronary artery with unstable angina pectoris: Secondary | ICD-10-CM | POA: Diagnosis not present

## 2018-06-10 DIAGNOSIS — Z8349 Family history of other endocrine, nutritional and metabolic diseases: Secondary | ICD-10-CM

## 2018-06-10 DIAGNOSIS — Z09 Encounter for follow-up examination after completed treatment for conditions other than malignant neoplasm: Secondary | ICD-10-CM

## 2018-06-10 DIAGNOSIS — E785 Hyperlipidemia, unspecified: Secondary | ICD-10-CM | POA: Diagnosis present

## 2018-06-10 DIAGNOSIS — Z4682 Encounter for fitting and adjustment of non-vascular catheter: Secondary | ICD-10-CM | POA: Diagnosis not present

## 2018-06-10 HISTORY — PX: LEFT HEART CATH AND CORONARY ANGIOGRAPHY: CATH118249

## 2018-06-10 LAB — GLUCOSE, CAPILLARY
Glucose-Capillary: 115 mg/dL — ABNORMAL HIGH (ref 70–99)
Glucose-Capillary: 145 mg/dL — ABNORMAL HIGH (ref 70–99)
Glucose-Capillary: 208 mg/dL — ABNORMAL HIGH (ref 70–99)

## 2018-06-10 LAB — PULMONARY FUNCTION TEST
FEF 25-75 Pre: 2.52 L/sec
FEF2575-%Pred-Pre: 114 %
FEV1-%Pred-Pre: 80 %
FEV1-Pre: 2.33 L
FEV1FVC-%Pred-Pre: 112 %
FEV6-%Pred-Pre: 75 %
FEV6-Pre: 2.78 L
FEV6FVC-%Pred-Pre: 105 %
FVC-%Pred-Pre: 71 %
FVC-Pre: 2.81 L
Pre FEV1/FVC ratio: 83 %
Pre FEV6/FVC Ratio: 99 %

## 2018-06-10 LAB — COMPREHENSIVE METABOLIC PANEL
ALT: 26 U/L (ref 0–44)
AST: 19 U/L (ref 15–41)
Albumin: 3.8 g/dL (ref 3.5–5.0)
Alkaline Phosphatase: 71 U/L (ref 38–126)
Anion gap: 8 (ref 5–15)
BUN: 7 mg/dL — ABNORMAL LOW (ref 8–23)
CO2: 26 mmol/L (ref 22–32)
Calcium: 8.7 mg/dL — ABNORMAL LOW (ref 8.9–10.3)
Chloride: 107 mmol/L (ref 98–111)
Creatinine, Ser: 0.87 mg/dL (ref 0.61–1.24)
GFR calc Af Amer: >60 mL/min (ref >60)
GFR calc non Af Amer: >60 mL/min (ref >60)
Glucose, Bld: 180 mg/dL — ABNORMAL HIGH (ref 70–99)
Potassium: 4.1 mmol/L (ref 3.5–5.1)
Sodium: 141 mmol/L (ref 135–145)
Total Bilirubin: 1.2 mg/dL (ref 0.3–1.2)
Total Protein: 6.2 g/dL — ABNORMAL LOW (ref 6.5–8.1)

## 2018-06-10 LAB — TYPE AND SCREEN
ABO/RH(D): A POS
Antibody Screen: NEGATIVE

## 2018-06-10 LAB — PROTIME-INR
INR: 1.12
Prothrombin Time: 14.3 seconds (ref 11.4–15.2)

## 2018-06-10 LAB — APTT: aPTT: 27 seconds (ref 24–36)

## 2018-06-10 SURGERY — LEFT HEART CATH AND CORONARY ANGIOGRAPHY
Anesthesia: LOCAL

## 2018-06-10 MED ORDER — TRANEXAMIC ACID 1000 MG/10ML IV SOLN
1.5000 mg/kg/h | INTRAVENOUS | Status: AC
  Administered 2018-06-11: 1.5 mg/kg/h via INTRAVENOUS
  Filled 2018-06-10: qty 25

## 2018-06-10 MED ORDER — TRANEXAMIC ACID (OHS) PUMP PRIME SOLUTION
2.0000 mg/kg | INTRAVENOUS | Status: DC
  Filled 2018-06-10: qty 1.72

## 2018-06-10 MED ORDER — SODIUM CHLORIDE 0.9 % IV SOLN
INTRAVENOUS | Status: DC
  Administered 2018-06-11: 06:00:00 via INTRAVENOUS

## 2018-06-10 MED ORDER — DEXMEDETOMIDINE HCL IN NACL 400 MCG/100ML IV SOLN
0.1000 ug/kg/h | INTRAVENOUS | Status: AC
  Administered 2018-06-11: 0.7 ug/kg/h via INTRAVENOUS
  Filled 2018-06-10 (×2): qty 100

## 2018-06-10 MED ORDER — FENTANYL CITRATE (PF) 100 MCG/2ML IJ SOLN
INTRAMUSCULAR | Status: DC | PRN
  Administered 2018-06-10: 25 ug via INTRAVENOUS

## 2018-06-10 MED ORDER — HEPARIN SODIUM (PORCINE) 1000 UNIT/ML IJ SOLN
INTRAMUSCULAR | Status: DC | PRN
  Administered 2018-06-10: 4500 [IU] via INTRAVENOUS

## 2018-06-10 MED ORDER — CHLORHEXIDINE GLUCONATE 0.12 % MT SOLN
15.0000 mL | Freq: Once | OROMUCOSAL | Status: AC
  Administered 2018-06-11: 15 mL via OROMUCOSAL
  Filled 2018-06-10: qty 15

## 2018-06-10 MED ORDER — DIAZEPAM 5 MG PO TABS: 5.0000 mg | ORAL_TABLET | Freq: Four times a day (QID) | ORAL | Status: DC | PRN

## 2018-06-10 MED ORDER — SODIUM CHLORIDE 0.9 % IV SOLN
750.0000 mg | INTRAVENOUS | Status: DC
  Filled 2018-06-10: qty 750

## 2018-06-10 MED ORDER — TRANEXAMIC ACID (OHS) BOLUS VIA INFUSION
15.0000 mg/kg | INTRAVENOUS | Status: DC
  Filled 2018-06-10: qty 1341

## 2018-06-10 MED ORDER — NITROGLYCERIN IN D5W 200-5 MCG/ML-% IV SOLN
2.0000 ug/min | INTRAVENOUS | Status: DC
  Filled 2018-06-10: qty 250

## 2018-06-10 MED ORDER — SODIUM CHLORIDE 0.9% FLUSH
3.0000 mL | Freq: Two times a day (BID) | INTRAVENOUS | Status: DC
  Administered 2018-06-10 (×2): 3 mL via INTRAVENOUS

## 2018-06-10 MED ORDER — MAGNESIUM SULFATE 50 % IJ SOLN
40.0000 meq | INTRAMUSCULAR | Status: DC
  Filled 2018-06-10: qty 9.85

## 2018-06-10 MED ORDER — LIDOCAINE HCL (PF) 1 % IJ SOLN
INTRAMUSCULAR | Status: AC
  Filled 2018-06-10: qty 30

## 2018-06-10 MED ORDER — PLASMA-LYTE 148 IV SOLN
INTRAVENOUS | Status: DC
  Filled 2018-06-10: qty 2.5

## 2018-06-10 MED ORDER — SODIUM CHLORIDE 0.9 % WEIGHT BASED INFUSION
3.0000 mL/kg/h | INTRAVENOUS | Status: DC
  Administered 2018-06-10: 3 mL/kg/h via INTRAVENOUS

## 2018-06-10 MED ORDER — ONDANSETRON HCL 4 MG/2ML IJ SOLN: 4.0000 mg | Freq: Four times a day (QID) | INTRAMUSCULAR | Status: DC | PRN

## 2018-06-10 MED ORDER — SODIUM CHLORIDE 0.9 % WEIGHT BASED INFUSION: 1.0000 mL/kg/h | INTRAVENOUS | Status: DC

## 2018-06-10 MED ORDER — SODIUM CHLORIDE 0.9% FLUSH: 3.0000 mL | Freq: Two times a day (BID) | INTRAVENOUS | Status: DC

## 2018-06-10 MED ORDER — SODIUM CHLORIDE 0.9 % IV SOLN
1.5000 g | INTRAVENOUS | Status: DC
  Filled 2018-06-10: qty 1.5

## 2018-06-10 MED ORDER — PHENYLEPHRINE HCL-NACL 20-0.9 MG/250ML-% IV SOLN
30.0000 ug/min | INTRAVENOUS | Status: AC
  Administered 2018-06-11: 15 ug/min via INTRAVENOUS
  Filled 2018-06-10 (×2): qty 250

## 2018-06-10 MED ORDER — PHENYLEPHRINE HCL-NACL 20-0.9 MG/250ML-% IV SOLN
30.0000 ug/min | INTRAVENOUS | Status: DC
  Filled 2018-06-10: qty 250

## 2018-06-10 MED ORDER — POTASSIUM CHLORIDE 2 MEQ/ML IV SOLN
80.0000 meq | INTRAVENOUS | Status: DC
  Filled 2018-06-10: qty 40

## 2018-06-10 MED ORDER — VERAPAMIL HCL 2.5 MG/ML IV SOLN
INTRAVENOUS | Status: AC
  Filled 2018-06-10: qty 2

## 2018-06-10 MED ORDER — TRANEXAMIC ACID (OHS) PUMP PRIME SOLUTION
2.0000 mg/kg | INTRAVENOUS | Status: DC
  Filled 2018-06-10: qty 1.79

## 2018-06-10 MED ORDER — ACETAMINOPHEN 325 MG PO TABS: 650.0000 mg | ORAL_TABLET | ORAL | Status: DC | PRN

## 2018-06-10 MED ORDER — NITROGLYCERIN IN D5W 200-5 MCG/ML-% IV SOLN
2.0000 ug/min | INTRAVENOUS | Status: AC
  Administered 2018-06-11: 10 ug/min via INTRAVENOUS
  Filled 2018-06-10: qty 250

## 2018-06-10 MED ORDER — VANCOMYCIN HCL 10 G IV SOLR
1500.0000 mg | INTRAVENOUS | Status: AC
  Administered 2018-06-11: 1500 mg via INTRAVENOUS
  Filled 2018-06-10: qty 1500

## 2018-06-10 MED ORDER — HEPARIN (PORCINE) IN NACL 1000-0.9 UT/500ML-% IV SOLN
INTRAVENOUS | Status: AC
  Filled 2018-06-10: qty 1000

## 2018-06-10 MED ORDER — DOPAMINE-DEXTROSE 3.2-5 MG/ML-% IV SOLN
0.0000 ug/kg/min | INTRAVENOUS | Status: DC
  Filled 2018-06-10: qty 250

## 2018-06-10 MED ORDER — MIDAZOLAM HCL 2 MG/2ML IJ SOLN
INTRAMUSCULAR | Status: DC | PRN
  Administered 2018-06-10: 2 mg via INTRAVENOUS

## 2018-06-10 MED ORDER — SODIUM CHLORIDE 0.9 % IV SOLN
INTRAVENOUS | Status: DC
  Filled 2018-06-10: qty 30

## 2018-06-10 MED ORDER — SODIUM CHLORIDE 0.9 % IV SOLN: 250.0000 mL | INTRAVENOUS | Status: DC | PRN

## 2018-06-10 MED ORDER — LIDOCAINE HCL (PF) 1 % IJ SOLN
INTRAMUSCULAR | Status: DC | PRN
  Administered 2018-06-10: 2 mL

## 2018-06-10 MED ORDER — SODIUM CHLORIDE 0.9% FLUSH: 3.0000 mL | INTRAVENOUS | Status: DC | PRN

## 2018-06-10 MED ORDER — VERAPAMIL HCL 2.5 MG/ML IV SOLN
INTRAVENOUS | Status: DC | PRN
  Administered 2018-06-10: 10 mL via INTRA_ARTERIAL

## 2018-06-10 MED ORDER — HEPARIN (PORCINE) IN NACL 1000-0.9 UT/500ML-% IV SOLN
INTRAVENOUS | Status: DC | PRN
  Administered 2018-06-10 (×2): 500 mL

## 2018-06-10 MED ORDER — IOHEXOL 350 MG/ML SOLN
INTRAVENOUS | Status: DC | PRN
  Administered 2018-06-10: 100 mL via INTRA_ARTERIAL

## 2018-06-10 MED ORDER — MILRINONE LACTATE IN DEXTROSE 20-5 MG/100ML-% IV SOLN
0.3000 ug/kg/min | INTRAVENOUS | Status: DC
  Filled 2018-06-10: qty 100

## 2018-06-10 MED ORDER — SODIUM CHLORIDE 0.9 % IV SOLN
1.5000 g | INTRAVENOUS | Status: AC
  Administered 2018-06-11: 1.5 g via INTRAVENOUS
  Filled 2018-06-10: qty 1.5

## 2018-06-10 MED ORDER — INSULIN REGULAR(HUMAN) IN NACL 100-0.9 UT/100ML-% IV SOLN
INTRAVENOUS | Status: DC
  Filled 2018-06-10: qty 100

## 2018-06-10 MED ORDER — NOREPINEPHRINE-SODIUM CHLORIDE 4-0.9 MG/250ML-% IV SOLN
0.0000 ug/min | INTRAVENOUS | Status: DC
  Filled 2018-06-10: qty 250

## 2018-06-10 MED ORDER — INSULIN REGULAR(HUMAN) IN NACL 100-0.9 UT/100ML-% IV SOLN
INTRAVENOUS | Status: AC
  Administered 2018-06-11: 1 [IU]/h via INTRAVENOUS
  Filled 2018-06-10: qty 100

## 2018-06-10 MED ORDER — TRANEXAMIC ACID 1000 MG/10ML IV SOLN
1.5000 mg/kg/h | INTRAVENOUS | Status: DC
  Filled 2018-06-10: qty 25

## 2018-06-10 MED ORDER — CHLORHEXIDINE GLUCONATE CLOTH 2 % EX PADS
6.0000 | MEDICATED_PAD | Freq: Once | CUTANEOUS | Status: AC
  Administered 2018-06-10: 6 via TOPICAL

## 2018-06-10 MED ORDER — MIDAZOLAM HCL 2 MG/2ML IJ SOLN
INTRAMUSCULAR | Status: AC
  Filled 2018-06-10: qty 2

## 2018-06-10 MED ORDER — DOPAMINE-DEXTROSE 3.2-5 MG/ML-% IV SOLN
0.0000 ug/kg/min | INTRAVENOUS | Status: AC
  Administered 2018-06-11: 3 ug/kg/min via INTRAVENOUS
  Filled 2018-06-10 (×2): qty 250

## 2018-06-10 MED ORDER — TRANEXAMIC ACID (OHS) BOLUS VIA INFUSION
15.0000 mg/kg | INTRAVENOUS | Status: AC
  Administered 2018-06-11: 1293 mg via INTRAVENOUS
  Filled 2018-06-10: qty 1293

## 2018-06-10 MED ORDER — ATORVASTATIN CALCIUM 80 MG PO TABS
80.0000 mg | ORAL_TABLET | Freq: Every day | ORAL | Status: DC
  Administered 2018-06-10 – 2018-06-16 (×6): 80 mg via ORAL
  Filled 2018-06-10 (×6): qty 1

## 2018-06-10 MED ORDER — FENTANYL CITRATE (PF) 100 MCG/2ML IJ SOLN
INTRAMUSCULAR | Status: AC
  Filled 2018-06-10: qty 2

## 2018-06-10 MED ORDER — VANCOMYCIN HCL 10 G IV SOLR
1500.0000 mg | INTRAVENOUS | Status: DC
  Filled 2018-06-10: qty 1500

## 2018-06-10 MED ORDER — EPINEPHRINE PF 1 MG/ML IJ SOLN
0.0000 ug/min | INTRAVENOUS | Status: DC
  Filled 2018-06-10: qty 4

## 2018-06-10 MED ORDER — BISACODYL 5 MG PO TBEC
5.0000 mg | DELAYED_RELEASE_TABLET | Freq: Once | ORAL | Status: AC
  Administered 2018-06-10: 5 mg via ORAL
  Filled 2018-06-10: qty 1

## 2018-06-10 MED ORDER — CHLORHEXIDINE GLUCONATE CLOTH 2 % EX PADS
6.0000 | MEDICATED_PAD | Freq: Once | CUTANEOUS | Status: AC
  Administered 2018-06-11: 6 via TOPICAL

## 2018-06-10 MED ORDER — TEMAZEPAM 15 MG PO CAPS
15.0000 mg | ORAL_CAPSULE | Freq: Once | ORAL | Status: AC | PRN
  Administered 2018-06-10: 15 mg via ORAL
  Filled 2018-06-10: qty 1

## 2018-06-10 MED ORDER — DEXMEDETOMIDINE HCL IN NACL 400 MCG/100ML IV SOLN
0.1000 ug/kg/h | INTRAVENOUS | Status: DC
  Filled 2018-06-10: qty 100

## 2018-06-10 MED ORDER — MAGNESIUM SULFATE 50 % IJ SOLN
40.0000 meq | INTRAMUSCULAR | Status: DC
  Filled 2018-06-10 (×2): qty 9.85

## 2018-06-10 MED ORDER — ASPIRIN 81 MG PO CHEW
81.0000 mg | CHEWABLE_TABLET | ORAL | Status: AC
  Administered 2018-06-10: 81 mg via ORAL
  Filled 2018-06-10: qty 1

## 2018-06-10 MED ORDER — METOPROLOL TARTRATE 12.5 MG HALF TABLET
12.5000 mg | ORAL_TABLET | Freq: Once | ORAL | Status: AC
  Administered 2018-06-11: 12.5 mg via ORAL
  Filled 2018-06-10: qty 1

## 2018-06-10 MED ORDER — HEPARIN SODIUM (PORCINE) 1000 UNIT/ML IJ SOLN
INTRAMUSCULAR | Status: AC
  Filled 2018-06-10: qty 1

## 2018-06-10 SURGICAL SUPPLY — 11 items
CATH INFINITI 5FR ANG PIGTAIL (CATHETERS) ×1 IMPLANT
CATH OPTITORQUE TIG 4.0 5F (CATHETERS) ×1 IMPLANT
DEVICE RAD COMP TR BAND LRG (VASCULAR PRODUCTS) ×1 IMPLANT
GLIDESHEATH SLEND SS 6F .021 (SHEATH) ×1 IMPLANT
GUIDEWIRE INQWIRE 1.5J.035X260 (WIRE) IMPLANT
INQWIRE 1.5J .035X260CM (WIRE) ×2
KIT HEART LEFT (KITS) ×2 IMPLANT
PACK CARDIAC CATHETERIZATION (CUSTOM PROCEDURE TRAY) ×2 IMPLANT
SYR MEDRAD MARK 7 150ML (SYRINGE) ×2 IMPLANT
TRANSDUCER W/STOPCOCK (MISCELLANEOUS) ×2 IMPLANT
TUBING CIL FLEX 10 FLL-RA (TUBING) ×2 IMPLANT

## 2018-06-10 NOTE — Consult Note (Addendum)
[address]       [place] [address]             [phone]        Nicole R Shoultz [facility] Medical Record #[id number] Date of Birth: 04-15-1946  Referring: Dr Claiborne Billings Primary Care: Shirline Frees, MD Primary Cardiologist:Thomas Claiborne Billings, MD  Chief Complaint: Severe coronary artery disease  History of Present Illness: The patient is a 73 year old male who was recently seen by Dr. Claiborne Billings for cardiology evaluation.  He is a former patient of Dr. Chase Picket who last saw him in January 2010.  Dr. Claiborne Billings first saw the patient in February 2017.  The patient has a history of coronary artery disease having had an inferior wall myocardial infarction in 2004 at which time 3 drug-eluting stents were placed in the RCA.  At that time he was also noted to have moderate to severe disease in the obtuse marginal system.  A repeat cardiac catheterization by Dr. Rex Kras revealed diffuse disease in the circumflex vessel not amenable to percutaneous intervention and he was treated with medical therapy.  A perfusion study in 2007 showed normal perfusion in all myocardial segments.  He has other cardiovascular risk factors including diabetes mellitus type 2, hyperlipidemia and hypertension.  He was seen this past summer after he was found on his Apple Watch to have episodes of possible atrial fibrillation.  He had an event monitor placed on August 21 through September 19 and the primary rhythm was sinus with the fastest heart rate at 120 bpm.  He had rare PVCs and rare PACs.  He did have several short-lived episodes of PAF with an A. fib burden of less than 1%.  He underwent an echocardiogram Doppler study on October 28 2019th which showed normal systolic function with ejection fraction of 55 to 60%.  He did not have any regional wall motion abnormalities.  There was also noted diastolic grade 1 dysfunction and mild dilation of his RV.  Over the past several months he stated that he felt well but  his watch also detected episodes of atrial fibrillation in November and again in December.  He was seen recently by Dr. Claiborne Billings for reevaluation his most recent EKG showed some new T wave inversion in inferior and anterolateral leads which was not present in a EKG on September 2019.  Claiborne Billings felt these were worrisome for ischemia and cardiac catheterization was scheduled.  On today's date he was taken to the Cath Lab and was found to have severe multivessel disease.  Please see the full report listed below.  We are now asked to see the patient in cardiothoracic surgical consultation for consideration of multivessel CABG.  Most recent echocardiogram was done 03/24/2018 and the results are also listed below.  Of note the patient has not had any history of chest pain even at the time of his inferior myocardial infarction. The patient has had increasing fatigue with exercise. He quit going to the gym 8 months ago due to arm pain. He thought he needed shoulder surgery but ultimately had neck surgery     Current Activity/ Functional Status: Patient is independent with mobility/ambulation, transfers, ADL's, IADL's.   Zubrod Score: At the time of surgery this patient's most appropriate activity status/level should be described as: []     0    Normal activity, no symptoms [x]     1    Restricted in physical strenuous activity but ambulatory, able to do out light  work []     2    Ambulatory and capable of self care, unable to do work activities, up and about                 more than 50%  Of the time                            []     3    Only limited self care, in bed greater than 50% of waking hours []     4    Completely disabled, no self care, confined to bed or chair []     5    Moribund  Past Medical History:  Diagnosis Date  . Arthritis    knees, hands  . Atrial fibrillation ([place])   . CAD (coronary artery disease)    a. prior MI in 2004 with DESx3 to RCA  . Complication of anesthesia    had difficulty  voiding after hemorrhoidectomy  . Diabetes mellitus without complication ([place])    type 2  . GERD (gastroesophageal reflux disease)    no longer with symptoms  . Headache    migraines as a child  . Hyperlipidemia   . Hypertension   . Sciatica    improved    Past Surgical History:  Procedure Laterality Date  . ADENOIDECTOMY    . ANTERIOR CERVICAL DECOMP/DISCECTOMY FUSION N/A 04/15/2018   Procedure: Cervical Four-Five Cervical Five-Six Anterior cervical decompression/discectomy/fusion;  Surgeon: Judith Part, MD;  Location: [facility];  Service: Neurosurgery;  Laterality: N/A;  anterior approach  . APPENDECTOMY    . BAND HEMORRHOIDECTOMY    . CARDIAC CATHETERIZATION    . COLONOSCOPY    . CORONARY ANGIOPLASTY  2004  . ESOPHAGEAL DILATION    . KNEE SURGERY Left   . LIPOMA EXCISION     neck  . TONSILLECTOMY    . TOTAL KNEE ARTHROPLASTY Left 06/01/2014   Procedure: TOTAL KNEE ARTHROPLASTY;  Surgeon: Hessie Dibble, MD;  Location: [facility];  Service: Orthopedics;  Laterality: Left;    Social History   Tobacco Use  Smoking Status Never Smoker  Smokeless Tobacco Never Used    Social History   Substance and Sexual Activity  Alcohol Use Yes   Comment: occasional     No Known Allergies  Current Facility-Administered Medications  Medication Dose Route Frequency Provider Last Rate Last Dose  . 0.9 %  sodium chloride infusion  250 mL Intravenous PRN Troy Sine, MD      . 0.9 %  sodium chloride infusion   Intravenous Continuous Troy Sine, MD      . 0.9% sodium chloride infusion  1 mL/kg/hr Intravenous Continuous Troy Sine, MD 89.3 mL/hr at 06/10/18 0709 1 mL/kg/hr at 06/10/18 0709  . sodium chloride flush (NS) 0.9 % injection 3 mL  3 mL Intravenous Q12H Shelva Majestic A, MD      . sodium chloride flush (NS) 0.9 % injection 3 mL  3 mL Intravenous PRN Troy Sine, MD        Medications Prior to Admission  Medication Sig Dispense Refill Last Dose  .  amLODipine-benazepril (LOTREL) 10-20 MG per capsule Take 1 capsule by mouth daily.   06/09/2018 at Unknown time  . apixaban (ELIQUIS) 5 MG TABS tablet Take 1 tablet (5 mg total) by mouth 2 (two) times daily. 120 tablet 4 06-07-18 at 0900  . atorvastatin (LIPITOR) 40 MG tablet Take  1 tablet (40 mg total) by mouth daily. 90 tablet 1 06/09/2018 at Unknown time  . Cinnamon 500 MG TABS Take 500 mg by mouth daily.   Past Month at Unknown time  . Coenzyme Q10 (CO Q 10) 100 MG CAPS Take 100 mg by mouth daily.    06/10/2018 at 0430  . esomeprazole (NEXIUM) 20 MG capsule Take 20 mg by mouth daily.    06/10/2018 at 0430  . Krill Oil 500 MG CAPS Take 500 mg by mouth daily.   06/09/2018 at Unknown time  . L-Arginine 1000 MG TABS Take 1 tablet by mouth 2 (two) times daily. L-Arginine 1000 mg / L-Citrulline   06/09/2018 at Unknown time  . Liraglutide (VICTOZA) 18 MG/3ML SOLN Inject 1.2 mg into the skin daily.    06/09/2018 at Unknown time  . MAGNESIUM GLUCONATE PO Take 150 mg by mouth daily.   06/09/2018 at Unknown time  . metFORMIN (GLUCOPHAGE) 500 MG tablet Take 500 mg by mouth 2 (two) times daily with a meal.   06/09/2018 at Unknown time  . methocarbamol (ROBAXIN) 500 MG tablet Take 1 tablet (500 mg total) by mouth every 6 (six) hours as needed for muscle spasms. (Patient taking differently: Take 500 mg by mouth at bedtime. Pt may have additional dose as needed) 50 tablet 0 06/09/2018 at Unknown time  . metoprolol (LOPRESSOR) 50 MG tablet Take 50 mg by mouth 2 (two) times daily.   06/10/2018 at 0430  . Multiple Vitamin (MULTIVITAMIN) tablet Take 1 tablet by mouth daily.   06/09/2018 at Unknown time  . Turmeric 500 MG CAPS Take 500 mg by mouth daily.   06/09/2018 at Unknown time    Family History  Problem Relation Age of Onset  . Dementia Mother   . Thyroid disease Mother   . Dementia Father   . High Cholesterol Father      Review of Systems:   Review of Systems  Constitutional: Negative for chills, diaphoresis,  fever, malaise/fatigue and weight loss.       Lost some weight after neck surgery in November because of poor appetite  HENT: Negative for congestion, ear discharge, ear pain, hearing loss, nosebleeds, sinus pain, sore throat and tinnitus.   Eyes: Negative for blurred vision, double vision, photophobia, pain, discharge and redness.  Respiratory: Positive for cough and sputum production. Negative for hemoptysis, shortness of breath, wheezing and stridor.   Cardiovascular: Positive for palpitations and leg swelling. Negative for chest pain, orthopnea, claudication and PND.  Gastrointestinal: Negative for abdominal pain, blood in stool, constipation, diarrhea, heartburn, nausea and vomiting.  Genitourinary: Negative for dysuria, flank pain, frequency, hematuria and urgency.  Musculoskeletal: Positive for myalgias. Negative for back pain, falls, joint pain and neck pain.       Hand arthritis Left TKR  Skin: Negative for itching and rash.  Neurological: Negative for dizziness, tingling, tremors, sensory change, speech change, focal weakness, seizures, loss of consciousness, weakness and headaches.  Endo/Heme/Allergies: Positive for environmental allergies and polydipsia. Does not bruise/bleed easily.  Psychiatric/Behavioral: Negative for depression, hallucinations, memory loss, substance abuse and suicidal ideas. The patient is nervous/anxious. The patient does not have insomnia.          Physical Exam: BP (!) 148/86   Pulse (!) 55   Temp 97.9 F (36.6 C)   Resp 13   Ht 5\' 7"  (1.702 m)   Wt 89.4 kg   SpO2 98%   BMI 30.85 kg/m    Physical Exam  Constitutional:  He appears healthy. No distress.  HENT:  Nose: No nasal discharge.  Mouth/Throat: Dentition is normal. No dental caries. Oropharynx is clear. Pharynx is normal.  Eyes: Pupils are equal, round, and reactive to light. Conjunctivae are normal.  Neck: Normal range of motion and thyroid normal. Neck supple. No JVD present. No neck  adenopathy. No thyromegaly present.  Cardiovascular: Regular rhythm, S1 normal, S2 normal and normal heart sounds. Exam reveals no gallop and no distant heart sounds.  No murmur heard. Pulses:      Carotid pulses are 1+ on the right side and 1+ on the left side.      Radial pulses are 1+ on the left side.       Femoral pulses are 1+ on the right side and 1+ on the left side.      Dorsalis pedis pulses are 2+ on the right side and 2+ on the left side.       Posterior tibial pulses are 0 on the right side and 0 on the left side.  Pulmonary/Chest: Effort normal and breath sounds normal. He has no wheezes. He has no rales. He exhibits no tenderness.  Abdominal: Soft. Bowel sounds are normal. He exhibits no distension and no mass. There is no splenomegaly or hepatomegaly. There is no abdominal tenderness. No hernia.  Musculoskeletal:        General: No tenderness, deformity or edema.  Neurological: He is alert and oriented to person, place, and time. He has normal motor skills.  Skin: Skin is warm and dry. No rash noted. No cyanosis. No jaundice or pallor. Nails show no clubbing.    Diagnostic Studies & Laboratory data:     Recent Radiology Findings:   No results found.   I have independently reviewed the above radiologic studies and discussed with the patient   Recent Lab Findings: Lab Results  Component Value Date   WBC 6.1 06/05/2018   HGB 14.6 06/05/2018   HCT 43.7 06/05/2018   PLT 236 06/05/2018   GLUCOSE 92 06/05/2018   ALT 24 06/05/2018   AST 15 06/05/2018   NA 141 06/05/2018   K 4.5 06/05/2018   CL 103 06/05/2018   CREATININE 0.77 06/05/2018   BUN 13 06/05/2018   CO2 22 06/05/2018   TSH 0.997 06/05/2018   INR 1.06 05/19/2014   HGBA1C 5.9 (H) 04/07/2018    LEFT HEART CATH AND CORONARY ANGIOGRAPHY  Conclusion     Prox RCA to Mid RCA lesion is 60% stenosed.  Mid RCA-1 lesion is 45% stenosed.  Mid RCA-2 lesion is 50% stenosed.  Mid RCA to Dist RCA lesion is 55%  stenosed.  Dist RCA lesion is 80% stenosed.  Ost RPDA to RPDA lesion is 10% stenosed.  RPDA lesion is 80% stenosed.  Ost 1st Mrg to 1st Mrg lesion is 90% stenosed.  Mid Cx to Dist Cx lesion is 90% stenosed.  Ost Cx to Prox Cx lesion is 80% stenosed.  Ost 1st Diag lesion is 95% stenosed.  Mid LAD lesion is 80% stenosed.  Dist LAD lesion is 99% stenosed.  The left ventricular ejection fraction is 45-50% by visual estimate.   There is evidence for significant cardiac calcification with ectatic vessels and multivessel CAD.  The LAD is calcified and has 95% diagonal 1 stenosis, diffuse 80% mid lady stenosis following an ectatic proximal segment and subtotal distal stenosis.  The left circumflex vessel is a very large caliber vessel with diffuse 80% ostial to proximal stenosis.  There  is diffuse 90% stenoses in an OM vessel as well as distal OM/posterolateral like vessel.  The RCA is ectatic and has proximal and mid 60% stenoses.  There is diffuse intimal hyperplasia with 50 to 60% narrowing in tandem stents in the distal RCA and a small region of 80% stenosis proximal to the stent in the proximal PDA.  The mid PDA has diffuse 80% stenosis.  Mild LV dysfunction with an EF of approximately 45% with focal apical akinesis.  There is mild mid anterolateral and basal posterior hypocontractility.  RECOMMENDATION: Surgical consultation for consideration of CABG revascularization surgery.  At present, reinstitution of Eliquis will be held.  The proximal circumflex is extremely large in caliber and larger than any stents which are available in lab.   Recommendations   Antiplatelet/Anticoag Recommend Aspirin 81mg  daily for moderate CAD. At present, the patient will stay off Eliquis pending CABG revascularization consultation.  Indications   Coronary artery disease involving native coronary artery of native heart with other form of angina pectoris ([place]) [I25.118 (ICD-10-CM)]  Abnormal  ECG [R94.31 (ICD-10-CM)]  Procedural Details   Technical Details Mr. Faye Sanfilippo is a 73 year old gentleman who is status post remote tandem stenting to his distal RCA in 2004. The patient has never experienced any chest pain. He states he also underwent a subsequent procedure several years later. He has continued to be relatively asymptomatic but over the past summer has been noted to have asymptomatic atrial fibrillation which was picked up on his apple watch. He was subsequently started on Eliquis anticoagulation. He presented to the office last week to establish care with me. During that evaluation, his ECG showed new inferior lateral T wave abnormalities suggestive of ischemia compared to an ECG in September 2019. His Eliquis was held for 48 hours and he presents today for definitive cardiac catheterization.  The patient was brought to the cardiac catheterization lab in the fasting state. An attempt at completing the appropriate use criteria was unsuccessful due to failure to open the [facility] web-based application. The patient was premedicated with Versed 2 mg and fentanyl 25 mcg. A right radial approach was utilized after an Allen's test verified adequate circulation. The right radial artery was punctured via the Seldinger technique, and a 6 [place] Glidesheath Slender was inserted without difficulty. A radial cocktail consisting of Verapamil 3 mg was administered. The patient received weight adjusted heparin. A safety J wire was advanced into the ascending aorta. Diagnostic catheterization was done with a 5 [place] TIG 4.0 catheter. A 5 French pigtail catheter was used for left ventriculography. A TR radial band was applied for hemostasis. The patient left the catheterization laboratory in stable condition with plans for surgical consultation for consideration of CABG revascularization.  Estimated blood loss <50 mL.   During this procedure medications were administered to achieve and maintain moderate  conscious sedation while the patient's heart rate, blood pressure, and oxygen saturation were continuously monitored and I was present face-to-face 100% of this time.  Medications  (Filter: Administrations occurring from 06/10/18 0718 to 06/10/18 2683)  Medication Rate/Dose/Volume Action  Date Time   midazolam (VERSED) injection (mg) 2 mg Given 06/10/18 0741   Total dose as of 06/10/18 1148        2 mg        fentaNYL (SUBLIMAZE) injection (mcg) 25 mcg Given 06/10/18 0741   Total dose as of 06/10/18 1148        25 mcg        lidocaine (PF) (XYLOCAINE)  1 % injection (mL) 2 mL Given 06/10/18 0751   Total dose as of 06/10/18 1148        2 mL        Radial Cocktail/Verapamil only (mL) 10 mL Given 06/10/18 0754   Total dose as of 06/10/18 1148        10 mL        Heparin (Porcine) in NaCl 1000-0.9 UT/500ML-% SOLN (mL) 500 mL Given 06/10/18 0757   Total dose as of 06/10/18 1148 500 mL Given 0757   1,000 mL        heparin injection (Units) 4,500 Units Given 06/10/18 0757   Total dose as of 06/10/18 1148        4,500 Units        iohexol (OMNIPAQUE) 350 MG/ML injection (mL) 100 mL Given 06/10/18 0818   Total dose as of 06/10/18 1148        100 mL        Sedation Time   Sedation Time Physician-1: 36 minutes 31 seconds  Coronary Findings   Diagnostic  Dominance: Right  Left Anterior Descending  Mid LAD lesion 80% stenosed  Mid LAD lesion is 80% stenosed.  Dist LAD lesion 99% stenosed  Dist LAD lesion is 99% stenosed.  First Diagonal Branch  Ost 1st Diag lesion 95% stenosed  Ost 1st Diag lesion is 95% stenosed.  Left Circumflex  Ost Cx to Prox Cx lesion 80% stenosed  Ost Cx to Prox Cx lesion is 80% stenosed.  Mid Cx to Dist Cx lesion 90% stenosed  Mid Cx to Dist Cx lesion is 90% stenosed.  First Obtuse Marginal Branch  Ost 1st Mrg to 1st Mrg lesion 90% stenosed  Ost 1st Mrg to 1st Mrg lesion is 90% stenosed.  Right Coronary Artery  Prox RCA to Mid RCA lesion 60% stenosed  Prox  RCA to Mid RCA lesion is 60% stenosed.  Mid RCA-1 lesion 45% stenosed  Mid RCA-1 lesion is 45% stenosed.  Mid RCA-2 lesion 50% stenosed  Mid RCA-2 lesion is 50% stenosed.  Mid RCA to Dist RCA lesion 55% stenosed  Mid RCA to Dist RCA lesion is 55% stenosed. The lesion was previously treated.  Dist RCA lesion 80% stenosed  Dist RCA lesion is 80% stenosed.  Right Posterior Descending Artery  Ost RPDA to RPDA lesion 10% stenosed  Ost RPDA to RPDA lesion is 10% stenosed. The lesion was previously treated.  RPDA lesion 80% stenosed  RPDA lesion is 80% stenosed.  Intervention   No interventions have been documented.  Wall Motion   Resting               Left Heart   Left Ventricle The left ventricular size is normal. The left ventricular ejection fraction is 45-50% by visual estimate. There are LV function abnormalities. LVEDP 16 mm  Coronary Diagrams   Diagnostic  Dominance: Right   Study Result   Result status: Final result                           [facility]                        [address]                        [place], [place] [address]  [phone]  ------------------------------------------------------------------- Transthoracic Echocardiography  Patient:    Jasaiah, Karwowski MR #:       [id number] Study Date: 03/24/2018 Gender:     M Age:        74 Height:     170.2 cm Weight:     90.4 kg BSA:        2.1 m^2 Pt. Status: Room:   ATTENDING    Ferdinand Lango K  REFERRING    Kerin Ransom K  PERFORMING   [facility], Outpatient  SONOGRAPHER  [facility], RDCS  cc:  ------------------------------------------------------------------- LV EF: 55% -   60%  ------------------------------------------------------------------- Indications:      Atrial Fibrillation (I48.0).  ------------------------------------------------------------------- History:   PMH:   Atrial fibrillation.   Coronary artery disease. PMH:   Myocardial infarction.  Risk factors:  Hypertension. Diabetes mellitus. Dyslipidemia.  ------------------------------------------------------------------- Study Conclusions  - Left ventricle: The cavity size was normal. Wall thickness was   normal. Systolic function was normal. The estimated ejection   fraction was in the range of 55% to 60%. Wall motion was normal;   there were no regional wall motion abnormalities. Doppler   parameters are consistent with abnormal left ventricular   relaxation (grade 1 diastolic dysfunction). - Right ventricle: The cavity size was mildly dilated.  Impressions:  - Normal LV systolic function; mild diastolic dysfunction; mild   RVE.  ------------------------------------------------------------------- Study data:  Comparison was made to the study of 07/26/2015.  Study status:  Routine.  Procedure:  Transthoracic echocardiography. Image quality was adequate.          Transthoracic echocardiography.  M-mode, complete 2D, spectral Doppler, and color Doppler.  Birthdate:  Patient birthdate: [birth year].  Age:  Patient is 73 yr old.  Sex:  Gender: male.    BMI: 31.2 kg/m^2.  Blood pressure:     143/83  Patient status:  Outpatient.  Study date: Study date: 03/24/2018. Study time: 09:46 AM.  Location:  [facility]  -------------------------------------------------------------------  ------------------------------------------------------------------- Left ventricle:  The cavity size was normal. Wall thickness was normal. Systolic function was normal. The estimated ejection fraction was in the range of 55% to 60%. Wall motion was normal; there were no regional wall motion abnormalities. Doppler parameters are consistent with abnormal left ventricular relaxation (grade 1 diastolic dysfunction).  ------------------------------------------------------------------- Aortic valve:   Trileaflet; mildly thickened  leaflets. Mobility was not restricted.  Doppler:  Transvalvular velocity was within the normal range. There was no stenosis. There was no regurgitation.   ------------------------------------------------------------------- Aorta:  Aortic root: The aortic root was normal in size.  ------------------------------------------------------------------- Mitral valve:   Structurally normal valve.   Mobility was not restricted.  Doppler:  Transvalvular velocity was within the normal range. There was no evidence for stenosis. There was trivial regurgitation.    Peak gradient (D): 3 mm Hg.  ------------------------------------------------------------------- Left atrium:  The atrium was normal in size.  ------------------------------------------------------------------- Right ventricle:  The cavity size was mildly dilated. Systolic function was normal.  ------------------------------------------------------------------- Pulmonic valve:    Doppler:  Transvalvular velocity was within the normal range. There was no evidence for stenosis.  ------------------------------------------------------------------- Tricuspid valve:   Structurally normal valve.    Doppler: Transvalvular velocity was within the normal range. There was trivial regurgitation.  ------------------------------------------------------------------- Pulmonary artery:   Systolic pressure was at the upper limits of normal.  ------------------------------------------------------------------- Right atrium:  The atrium was normal in size.  ------------------------------------------------------------------- Pericardium:  There was no pericardial  effusion.  ------------------------------------------------------------------- Systemic veins: Inferior vena cava: The vessel was normal in size.  ------------------------------------------------------------------- Measurements   Left ventricle                         Value         Reference  LV ID, ED, PLAX chordal                46.5  mm     43 - 52  LV ID, ES, PLAX chordal                32.9  mm     23 - 38  LV fx shortening, PLAX chordal         29    %      >=29  LV PW thickness, ED                    11.1  mm     ----------  IVS/LV PW ratio, ED                    0.89         <=1.3  Stroke volume, 2D                      102   ml     ----------  Stroke volume/bsa, 2D                  49    ml/m^2 ----------  LV e&', lateral                         11.4  cm/s   ----------  LV E/e&', lateral                       8.05         ----------  LV e&', medial                          7.4   cm/s   ----------  LV E/e&', medial                        12.41        ----------  LV e&', average                         9.4   cm/s   ----------  LV E/e&', average                       9.77         ----------    Ventricular septum                     Value        Reference  IVS thickness, ED                      9.83  mm     ----------    LVOT                                   Value        Reference  LVOT ID, S  25    mm     ----------  LVOT area                              4.91  cm^2   ----------  LVOT peak velocity, S                  75.5  cm/s   ----------  LVOT mean velocity, S                  52.7  cm/s   ----------  LVOT VTI, S                            20.8  cm     ----------    Aorta                                  Value        Reference  Aortic root ID, ED                     35    mm     ----------  Ascending aorta ID, A-P, S             34    mm     ----------    Left atrium                            Value        Reference  LA ID, A-P, ES                         51    mm     ----------  LA ID/bsa, A-P                 (H)     2.43  cm/m^2 <=2.2  LA volume, S                           60.7  ml     ----------  LA volume/bsa, S                       29    ml/m^2 ----------  LA volume, ES, 1-p A4C                 60.9  ml      ----------  LA volume/bsa, ES, 1-p A4C             29    ml/m^2 ----------  LA volume, ES, 1-p A2C                 57.8  ml     ----------  LA volume/bsa, ES, 1-p A2C             27.6  ml/m^2 ----------    Mitral valve                           Value        Reference  Mitral E-wave peak velocity            91.8  cm/s   ----------  Mitral A-wave peak velocity            116   cm/s   ----------  Mitral deceleration time       (H)     264   ms     150 - 230  Mitral peak gradient, D                3     mm Hg  ----------  Mitral E/A ratio, peak                 0.8          ----------    Pulmonary arteries                     Value        Reference  PA pressure, S, DP                     30    mm Hg  <=30    Tricuspid valve                        Value        Reference  Tricuspid regurg peak velocity         258   cm/s   ----------  Tricuspid peak RV-RA gradient          27    mm Hg  ----------    Right atrium                           Value        Reference  RA ID, S-I, ES, A4C                    44.9  mm     34 - 49  RA area, ES, A4C                       15.7  cm^2   8.3 - 19.5  RA volume, ES, A/L                     43.6  ml     ----------  RA volume/bsa, ES, A/L                 20.8  ml/m^2 ----------    Systemic veins                         Value        Reference  Estimated CVP                          3     mm Hg  ----------    Right ventricle                        Value        Reference  TAPSE                                  21.4  mm     ----------  RV pressure, S, DP  30    mm Hg  <=30  RV s&', lateral, S                      12.8  cm/s   ----------  Legend: (L)  and  (H)  mark values outside specified reference range.  ------------------------------------------------------------------- Prepared and Electronically Authenticated by  Kirk Ruths     Assessment / Plan: Severe multivessel coronary artery disease not amenable to percutaneous  intervention.  History of previous myocardial infarction and multiple prior coronary percutaneous interventions including drug-eluting stents x3 in the right coronary artery.  On review of the patient's films he has very diffuse distal disease especially in the right and LAD distribution, the circumflex is a large vessel with diffuse disease involving 2 major circumflex branches.  I discussed with the patient proceeding with coronary artery bypass grafting as the best treatment option.  Risks and options of the procedure were discussed with the patient in detail, the change in his clinical situation since several months ago including flipped T waves.  Risks of surgery were discussed with the patient and his wife in detail.   Paroxysmal atrial fibrillation: On Eliquis, last dose last Saturday morning.  Patient has had about 6 months of very intermittent atrial fibrillation, usually very transient, and has never lasted more than 2 days. -I discussed with the patient at the time of bypass surgery placing a left atrial clip and consider maze procedure with bilateral pulmonary vein isolation.  Type 2 diabetes mellitus: Most recent hemoglobin A1c 5.9 in November 2019 Hyperlipidemia Essential hypertension Cervical myelopathy status post anterior repair as described above   We will plan to proceed with coronary artery bypass grafting, atrial clipping and possible maze procedure tomorrow.  The patient has been off his Eliquis since the morning of January 11.  The goals risks and alternatives of the planned surgical procedure coronary artery bypass grafting left atrial clip possible maze. have been discussed with the patient in detail. The risks of the procedure including death, infection, stroke, myocardial infarction, bleeding, blood transfusion have all been discussed specifically.  I have quoted Ronette Deter a 2 % of perioperative mortality and a complication rate as high as 40 %. The patient's  questions have been answered.KEYANDRE PILEGGI is willing  to proceed with the planned procedure.   Grace Isaac MD      [address] [place] [address] Office [phone]   [place]

## 2018-06-10 NOTE — Progress Notes (Signed)
TR BAND REMOVAL  LOCATION:    Radial rt radial  DEFLATED PER PROTOCOL:   yes  TIME BAND OFF / DRESSING APPLIED:    1125/gauze and tegaderm  SITE UPON ARRIVAL:    Level 0  SITE AFTER BAND REMOVAL:    Level 0  CIRCULATION SENSATION AND MOVEMENT:    Within Normal Limits :  Yes, rt hand and fingers warm and pink, rt arm resting on pillow. Instructions reviewed w/patient.  COMMENTS:

## 2018-06-10 NOTE — Progress Notes (Signed)
Pre CABG evaluation completed. Please see preliminary notes on CV PROC under chart review. Conlan Miceli H Brittie Whisnant(RDMS RVT) 06/10/18 12:55 PM

## 2018-06-10 NOTE — Anesthesia Preprocedure Evaluation (Addendum)
Anesthesia Evaluation  Patient identified by MRN, date of birth, ID band Patient awake    Reviewed: Allergy & Precautions, H&P , NPO status , Patient's Chart, lab work & pertinent test results, reviewed documented beta blocker date and time   Airway Mallampati: III  TM Distance: >3 FB Neck ROM: Limited    Dental no notable dental hx. (+) Teeth Intact, Dental Advisory Given   Pulmonary neg pulmonary ROS,    Pulmonary exam normal breath sounds clear to auscultation       Cardiovascular Exercise Tolerance: Good hypertension, Pt. on medications and Pt. on home beta blockers + CAD   Rhythm:Regular Rate:Normal     Neuro/Psych  Headaches, negative psych ROS   GI/Hepatic Neg liver ROS, GERD  Medicated,  Endo/Other  diabetes, Type 2, Oral Hypoglycemic Agents  Renal/GU negative Renal ROS  negative genitourinary   Musculoskeletal  (+) Arthritis ,   Abdominal   Peds  Hematology negative hematology ROS (+)   Anesthesia Other Findings   Reproductive/Obstetrics negative OB ROS                            Anesthesia Physical Anesthesia Plan  ASA: IV  Anesthesia Plan: General   Post-op Pain Management:    Induction: Intravenous  PONV Risk Score and Plan: 2 and Midazolam and Treatment may vary due to age or medical condition  Airway Management Planned: Oral ETT  Additional Equipment: Arterial line, CVP, PA Cath, TEE and Ultrasound Guidance Line Placement  Intra-op Plan:   Post-operative Plan: Post-operative intubation/ventilation  Informed Consent: I have reviewed the patients History and Physical, chart, labs and discussed the procedure including the risks, benefits and alternatives for the proposed anesthesia with the patient or authorized representative who has indicated his/her understanding and acceptance.     Dental advisory given  Plan Discussed with: CRNA  Anesthesia Plan  Comments:         Anesthesia Quick Evaluation

## 2018-06-10 NOTE — Progress Notes (Signed)
TCTS called for CABG evaluation.  °

## 2018-06-10 NOTE — Interval H&P Note (Signed)
Cath Lab Visit (complete for each Cath Lab visit)  Clinical Evaluation Leading to the Procedure:   ACS: No.  Non-ACS:    Anginal Classification: CCS I  Anti-ischemic medical therapy: Maximal Therapy (2 or more classes of medications)  Non-Invasive Test Results: No non-invasive testing performed  Prior CABG: No previous CABG      History and Physical Interval Note:  06/10/2018 7:34 AM  Ryan Wagner  has presented today for surgery, with the diagnosis of CAD  The various methods of treatment have been discussed with the patient and family. After consideration of risks, benefits and other options for treatment, the patient has consented to  Procedure(s): LEFT HEART CATH AND CORONARY ANGIOGRAPHY (N/A) as a surgical intervention .  The patient's history has been reviewed, patient examined, no change in status, stable for surgery.  I have reviewed the patient's chart and labs.  Questions were answered to the patient's satisfaction.     Shelva Majestic

## 2018-06-11 ENCOUNTER — Ambulatory Visit (HOSPITAL_COMMUNITY): Payer: HMO | Admitting: Anesthesiology

## 2018-06-11 ENCOUNTER — Inpatient Hospital Stay (HOSPITAL_COMMUNITY): Payer: HMO

## 2018-06-11 ENCOUNTER — Encounter (HOSPITAL_COMMUNITY): Payer: Self-pay | Admitting: Certified Registered Nurse Anesthetist

## 2018-06-11 ENCOUNTER — Encounter (HOSPITAL_COMMUNITY)
Admission: AD | Disposition: A | Discharge status: Home / Self Care | Payer: Self-pay | Source: Home / Self Care | Attending: Cardiothoracic Surgery

## 2018-06-11 ENCOUNTER — Ambulatory Visit (HOSPITAL_COMMUNITY): Payer: HMO

## 2018-06-11 DIAGNOSIS — I10 Essential (primary) hypertension: Secondary | ICD-10-CM | POA: Diagnosis present

## 2018-06-11 DIAGNOSIS — M1990 Unspecified osteoarthritis, unspecified site: Secondary | ICD-10-CM | POA: Diagnosis present

## 2018-06-11 DIAGNOSIS — E119 Type 2 diabetes mellitus without complications: Secondary | ICD-10-CM | POA: Diagnosis present

## 2018-06-11 DIAGNOSIS — R110 Nausea: Secondary | ICD-10-CM | POA: Diagnosis present

## 2018-06-11 DIAGNOSIS — E785 Hyperlipidemia, unspecified: Secondary | ICD-10-CM | POA: Diagnosis present

## 2018-06-11 DIAGNOSIS — K219 Gastro-esophageal reflux disease without esophagitis: Secondary | ICD-10-CM | POA: Diagnosis present

## 2018-06-11 DIAGNOSIS — I2584 Coronary atherosclerosis due to calcified coronary lesion: Secondary | ICD-10-CM | POA: Diagnosis present

## 2018-06-11 DIAGNOSIS — I25118 Atherosclerotic heart disease of native coronary artery with other forms of angina pectoris: Secondary | ICD-10-CM | POA: Diagnosis present

## 2018-06-11 DIAGNOSIS — D62 Acute posthemorrhagic anemia: Secondary | ICD-10-CM | POA: Diagnosis not present

## 2018-06-11 DIAGNOSIS — R001 Bradycardia, unspecified: Secondary | ICD-10-CM | POA: Diagnosis not present

## 2018-06-11 DIAGNOSIS — Z955 Presence of coronary angioplasty implant and graft: Secondary | ICD-10-CM | POA: Diagnosis not present

## 2018-06-11 DIAGNOSIS — I480 Paroxysmal atrial fibrillation: Secondary | ICD-10-CM | POA: Diagnosis present

## 2018-06-11 DIAGNOSIS — Z981 Arthrodesis status: Secondary | ICD-10-CM | POA: Diagnosis not present

## 2018-06-11 DIAGNOSIS — Z8349 Family history of other endocrine, nutritional and metabolic diseases: Secondary | ICD-10-CM | POA: Diagnosis not present

## 2018-06-11 DIAGNOSIS — Z96652 Presence of left artificial knee joint: Secondary | ICD-10-CM | POA: Diagnosis present

## 2018-06-11 DIAGNOSIS — I2510 Atherosclerotic heart disease of native coronary artery without angina pectoris: Secondary | ICD-10-CM | POA: Diagnosis present

## 2018-06-11 DIAGNOSIS — Z79899 Other long term (current) drug therapy: Secondary | ICD-10-CM | POA: Diagnosis not present

## 2018-06-11 DIAGNOSIS — I252 Old myocardial infarction: Secondary | ICD-10-CM | POA: Diagnosis not present

## 2018-06-11 DIAGNOSIS — Z7984 Long term (current) use of oral hypoglycemic drugs: Secondary | ICD-10-CM | POA: Diagnosis not present

## 2018-06-11 DIAGNOSIS — I973 Postprocedural hypertension: Secondary | ICD-10-CM | POA: Diagnosis not present

## 2018-06-11 DIAGNOSIS — E8770 Fluid overload, unspecified: Secondary | ICD-10-CM | POA: Diagnosis not present

## 2018-06-11 DIAGNOSIS — Z7901 Long term (current) use of anticoagulants: Secondary | ICD-10-CM | POA: Diagnosis not present

## 2018-06-11 DIAGNOSIS — I25110 Atherosclerotic heart disease of native coronary artery with unstable angina pectoris: Secondary | ICD-10-CM | POA: Diagnosis not present

## 2018-06-11 HISTORY — PX: CORONARY ARTERY BYPASS GRAFT: SHX141

## 2018-06-11 HISTORY — PX: MAZE: SHX5063

## 2018-06-11 HISTORY — PX: CLIPPING OF ATRIAL APPENDAGE: SHX5773

## 2018-06-11 HISTORY — PX: TEE WITHOUT CARDIOVERSION: SHX5443

## 2018-06-11 LAB — POCT I-STAT 3, ART BLOOD GAS (G3+)
Acid-Base Excess: 1 mmol/L (ref 0.0–2.0)
Acid-base deficit: 3 mmol/L — ABNORMAL HIGH (ref 0.0–2.0)
Acid-base deficit: 4 mmol/L — ABNORMAL HIGH (ref 0.0–2.0)
Acid-base deficit: 4 mmol/L — ABNORMAL HIGH (ref 0.0–2.0)
Bicarbonate: 25.6 mmol/L (ref 20.0–28.0)
Bicarbonate: 24.8 mmol/L (ref 20.0–28.0)
Bicarbonate: 23.9 mmol/L (ref 20.0–28.0)
Bicarbonate: 21.6 mmol/L (ref 20.0–28.0)
Bicarbonate: 21.4 mmol/L (ref 20.0–28.0)
O2 Saturation: 100 %
O2 Saturation: 99 %
O2 Saturation: 96 %
O2 Saturation: 95 %
O2 Saturation: 91 %
Patient temperature: 36.2
Patient temperature: 36.2
TCO2: 27 mmol/L (ref 22–32)
TCO2: 26 mmol/L (ref 22–32)
TCO2: 25 mmol/L (ref 22–32)
TCO2: 23 mmol/L (ref 22–32)
TCO2: 23 mmol/L (ref 22–32)
pCO2 arterial: 40.3 mmHg (ref 32.0–48.0)
pCO2 arterial: 40.8 mmHg (ref 32.0–48.0)
pCO2 arterial: 45.7 mmHg (ref 32.0–48.0)
pCO2 arterial: 40.5 mmHg (ref 32.0–48.0)
pCO2 arterial: 36.8 mmHg (ref 32.0–48.0)
pH, Arterial: 7.41 (ref 7.350–7.450)
pH, Arterial: 7.391 (ref 7.350–7.450)
pH, Arterial: 7.322 — ABNORMAL LOW (ref 7.350–7.450)
pH, Arterial: 7.336 — ABNORMAL LOW (ref 7.350–7.450)
pH, Arterial: 7.369 (ref 7.350–7.450)
pO2, Arterial: 408 mmHg — ABNORMAL HIGH (ref 83.0–108.0)
pO2, Arterial: 153 mmHg — ABNORMAL HIGH (ref 83.0–108.0)
pO2, Arterial: 83 mmHg (ref 83.0–108.0)
pO2, Arterial: 81 mmHg — ABNORMAL LOW (ref 83.0–108.0)
pO2, Arterial: 60 mmHg — ABNORMAL LOW (ref 83.0–108.0)

## 2018-06-11 LAB — SURGICAL PCR SCREEN
MRSA, PCR: NEGATIVE
Staphylococcus aureus: NEGATIVE

## 2018-06-11 LAB — POCT I-STAT, CHEM 8
BUN: 6 mg/dL — ABNORMAL LOW (ref 8–23)
BUN: 5 mg/dL — ABNORMAL LOW (ref 8–23)
BUN: 6 mg/dL — ABNORMAL LOW (ref 8–23)
BUN: 6 mg/dL — ABNORMAL LOW (ref 8–23)
BUN: 6 mg/dL — ABNORMAL LOW (ref 8–23)
BUN: 6 mg/dL — ABNORMAL LOW (ref 8–23)
BUN: 6 mg/dL — ABNORMAL LOW (ref 8–23)
BUN: 7 mg/dL — ABNORMAL LOW (ref 8–23)
Calcium, Ion: 1.17 mmol/L (ref 1.15–1.40)
Calcium, Ion: 0.9 mmol/L — ABNORMAL LOW (ref 1.15–1.40)
Calcium, Ion: 1.14 mmol/L — ABNORMAL LOW (ref 1.15–1.40)
Calcium, Ion: 1.05 mmol/L — ABNORMAL LOW (ref 1.15–1.40)
Calcium, Ion: 1.04 mmol/L — ABNORMAL LOW (ref 1.15–1.40)
Calcium, Ion: 1.05 mmol/L — ABNORMAL LOW (ref 1.15–1.40)
Calcium, Ion: 1.06 mmol/L — ABNORMAL LOW (ref 1.15–1.40)
Calcium, Ion: 1.09 mmol/L — ABNORMAL LOW (ref 1.15–1.40)
Chloride: 106 mmol/L (ref 98–111)
Chloride: 102 mmol/L (ref 98–111)
Chloride: 107 mmol/L (ref 98–111)
Chloride: 104 mmol/L (ref 98–111)
Chloride: 105 mmol/L (ref 98–111)
Chloride: 106 mmol/L (ref 98–111)
Chloride: 106 mmol/L (ref 98–111)
Chloride: 109 mmol/L (ref 98–111)
Creatinine, Ser: 0.5 mg/dL — ABNORMAL LOW (ref 0.61–1.24)
Creatinine, Ser: 0.3 mg/dL — ABNORMAL LOW (ref 0.61–1.24)
Creatinine, Ser: 0.5 mg/dL — ABNORMAL LOW (ref 0.61–1.24)
Creatinine, Ser: 0.4 mg/dL — ABNORMAL LOW (ref 0.61–1.24)
Creatinine, Ser: 0.5 mg/dL — ABNORMAL LOW (ref 0.61–1.24)
Creatinine, Ser: 0.4 mg/dL — ABNORMAL LOW (ref 0.61–1.24)
Creatinine, Ser: 0.5 mg/dL — ABNORMAL LOW (ref 0.61–1.24)
Creatinine, Ser: 0.5 mg/dL — ABNORMAL LOW (ref 0.61–1.24)
Glucose, Bld: 121 mg/dL — ABNORMAL HIGH (ref 70–99)
Glucose, Bld: 114 mg/dL — ABNORMAL HIGH (ref 70–99)
Glucose, Bld: 131 mg/dL — ABNORMAL HIGH (ref 70–99)
Glucose, Bld: 165 mg/dL — ABNORMAL HIGH (ref 70–99)
Glucose, Bld: 168 mg/dL — ABNORMAL HIGH (ref 70–99)
Glucose, Bld: 166 mg/dL — ABNORMAL HIGH (ref 70–99)
Glucose, Bld: 172 mg/dL — ABNORMAL HIGH (ref 70–99)
Glucose, Bld: 127 mg/dL — ABNORMAL HIGH (ref 70–99)
HCT: 40 % (ref 39.0–52.0)
HCT: 28 % — ABNORMAL LOW (ref 39.0–52.0)
HCT: 35 % — ABNORMAL LOW (ref 39.0–52.0)
HCT: 31 % — ABNORMAL LOW (ref 39.0–52.0)
HCT: 28 % — ABNORMAL LOW (ref 39.0–52.0)
HCT: 28 % — ABNORMAL LOW (ref 39.0–52.0)
HCT: 29 % — ABNORMAL LOW (ref 39.0–52.0)
HCT: 32 % — ABNORMAL LOW (ref 39.0–52.0)
Hemoglobin: 13.6 g/dL (ref 13.0–17.0)
Hemoglobin: 11.9 g/dL — ABNORMAL LOW (ref 13.0–17.0)
Hemoglobin: 9.5 g/dL — ABNORMAL LOW (ref 13.0–17.0)
Hemoglobin: 10.5 g/dL — ABNORMAL LOW (ref 13.0–17.0)
Hemoglobin: 9.5 g/dL — ABNORMAL LOW (ref 13.0–17.0)
Hemoglobin: 9.5 g/dL — ABNORMAL LOW (ref 13.0–17.0)
Hemoglobin: 9.9 g/dL — ABNORMAL LOW (ref 13.0–17.0)
Hemoglobin: 10.9 g/dL — ABNORMAL LOW (ref 13.0–17.0)
Potassium: 3.8 mmol/L (ref 3.5–5.1)
Potassium: 3.4 mmol/L — ABNORMAL LOW (ref 3.5–5.1)
Potassium: 3.5 mmol/L (ref 3.5–5.1)
Potassium: 3.7 mmol/L (ref 3.5–5.1)
Potassium: 3.6 mmol/L (ref 3.5–5.1)
Potassium: 3.5 mmol/L (ref 3.5–5.1)
Potassium: 4.1 mmol/L (ref 3.5–5.1)
Potassium: 3.9 mmol/L (ref 3.5–5.1)
Sodium: 142 mmol/L (ref 135–145)
Sodium: 141 mmol/L (ref 135–145)
Sodium: 142 mmol/L (ref 135–145)
Sodium: 139 mmol/L (ref 135–145)
Sodium: 140 mmol/L (ref 135–145)
Sodium: 141 mmol/L (ref 135–145)
Sodium: 142 mmol/L (ref 135–145)
Sodium: 142 mmol/L (ref 135–145)
TCO2: 28 mmol/L (ref 22–32)
TCO2: 26 mmol/L (ref 22–32)
TCO2: 27 mmol/L (ref 22–32)
TCO2: 28 mmol/L (ref 22–32)
TCO2: 28 mmol/L (ref 22–32)
TCO2: 25 mmol/L (ref 22–32)
TCO2: 26 mmol/L (ref 22–32)
TCO2: 23 mmol/L (ref 22–32)

## 2018-06-11 LAB — BASIC METABOLIC PANEL
Anion gap: 9 (ref 5–15)
BUN: 8 mg/dL (ref 8–23)
CO2: 22 mmol/L (ref 22–32)
Calcium: 8.4 mg/dL — ABNORMAL LOW (ref 8.9–10.3)
Chloride: 110 mmol/L (ref 98–111)
Creatinine, Ser: 0.71 mg/dL (ref 0.61–1.24)
GFR calc Af Amer: >60 mL/min (ref >60)
GFR calc non Af Amer: >60 mL/min (ref >60)
Glucose, Bld: 124 mg/dL — ABNORMAL HIGH (ref 70–99)
Potassium: 3.5 mmol/L (ref 3.5–5.1)
Sodium: 141 mmol/L (ref 135–145)

## 2018-06-11 LAB — URINALYSIS, ROUTINE W REFLEX MICROSCOPIC
Bilirubin Urine: NEGATIVE
Glucose, UA: 50 mg/dL — AB
Hgb urine dipstick: NEGATIVE
Ketones, ur: 5 mg/dL — AB
Leukocytes, UA: NEGATIVE
Nitrite: NEGATIVE
Protein, ur: NEGATIVE mg/dL
Specific Gravity, Urine: 1.008 (ref 1.005–1.030)
pH: 7 (ref 5.0–8.0)

## 2018-06-11 LAB — GLUCOSE, CAPILLARY
Glucose-Capillary: 119 mg/dL — ABNORMAL HIGH (ref 70–99)
Glucose-Capillary: 131 mg/dL — ABNORMAL HIGH (ref 70–99)
Glucose-Capillary: 112 mg/dL — ABNORMAL HIGH (ref 70–99)
Glucose-Capillary: 115 mg/dL — ABNORMAL HIGH (ref 70–99)
Glucose-Capillary: 108 mg/dL — ABNORMAL HIGH (ref 70–99)
Glucose-Capillary: 119 mg/dL — ABNORMAL HIGH (ref 70–99)
Glucose-Capillary: 126 mg/dL — ABNORMAL HIGH (ref 70–99)

## 2018-06-11 LAB — CBC
HCT: 45.2 % (ref 39.0–52.0)
HCT: 38.9 % — ABNORMAL LOW (ref 39.0–52.0)
HCT: 33.9 % — ABNORMAL LOW (ref 39.0–52.0)
Hemoglobin: 14.6 g/dL (ref 13.0–17.0)
Hemoglobin: 12.9 g/dL — ABNORMAL LOW (ref 13.0–17.0)
Hemoglobin: 11.4 g/dL — ABNORMAL LOW (ref 13.0–17.0)
MCH: 27.7 pg (ref 26.0–34.0)
MCH: 28.2 pg (ref 26.0–34.0)
MCH: 29 pg (ref 26.0–34.0)
MCHC: 32.3 g/dL (ref 30.0–36.0)
MCHC: 33.2 g/dL (ref 30.0–36.0)
MCHC: 33.6 g/dL (ref 30.0–36.0)
MCV: 85.6 fL (ref 80.0–100.0)
MCV: 85.1 fL (ref 80.0–100.0)
MCV: 86.3 fL (ref 80.0–100.0)
Platelets: 208 10*3/uL (ref 150–400)
Platelets: 154 10*3/uL (ref 150–400)
Platelets: 164 10*3/uL (ref 150–400)
RBC: 5.28 MIL/uL (ref 4.22–5.81)
RBC: 4.57 MIL/uL (ref 4.22–5.81)
RBC: 3.93 MIL/uL — ABNORMAL LOW (ref 4.22–5.81)
RDW: 14 % (ref 11.5–15.5)
RDW: 13.8 % (ref 11.5–15.5)
RDW: 13.8 % (ref 11.5–15.5)
WBC: 6.9 10*3/uL (ref 4.0–10.5)
WBC: 21.5 10*3/uL — ABNORMAL HIGH (ref 4.0–10.5)
WBC: 17.4 10*3/uL — ABNORMAL HIGH (ref 4.0–10.5)
nRBC: 0 % (ref 0.0–0.2)
nRBC: 0 % (ref 0.0–0.2)
nRBC: 0 % (ref 0.0–0.2)

## 2018-06-11 LAB — APTT: aPTT: 33 seconds (ref 24–36)

## 2018-06-11 LAB — CREATININE, SERUM
Creatinine, Ser: 0.63 mg/dL (ref 0.61–1.24)
GFR calc Af Amer: >60 mL/min
GFR calc non Af Amer: >60 mL/min

## 2018-06-11 LAB — POCT I-STAT 4, (NA,K, GLUC, HGB,HCT)
Glucose, Bld: 131 mg/dL — ABNORMAL HIGH (ref 70–99)
HCT: 38 % — ABNORMAL LOW (ref 39.0–52.0)
Hemoglobin: 12.9 g/dL — ABNORMAL LOW (ref 13.0–17.0)
Potassium: 3.5 mmol/L (ref 3.5–5.1)
Sodium: 145 mmol/L (ref 135–145)

## 2018-06-11 LAB — PLATELET COUNT: Platelets: 179 10*3/uL (ref 150–400)

## 2018-06-11 LAB — PROTIME-INR
INR: 1.59
Prothrombin Time: 18.8 seconds — ABNORMAL HIGH (ref 11.4–15.2)

## 2018-06-11 LAB — HEMOGLOBIN AND HEMATOCRIT, BLOOD
HCT: 30.7 % — ABNORMAL LOW (ref 39.0–52.0)
Hemoglobin: 10 g/dL — ABNORMAL LOW (ref 13.0–17.0)

## 2018-06-11 LAB — MAGNESIUM: Magnesium: 3 mg/dL — ABNORMAL HIGH (ref 1.7–2.4)

## 2018-06-11 SURGERY — CORONARY ARTERY BYPASS GRAFTING (CABG)
Anesthesia: General | Site: Chest

## 2018-06-11 MED ORDER — METOPROLOL TARTRATE 5 MG/5ML IV SOLN: 2.5000 mg | INTRAVENOUS | Status: DC | PRN

## 2018-06-11 MED ORDER — METOPROLOL TARTRATE 25 MG/10 ML ORAL SUSPENSION: 12.5000 mg | Freq: Two times a day (BID) | ORAL | Status: DC

## 2018-06-11 MED ORDER — EPHEDRINE 5 MG/ML INJ
INTRAVENOUS | Status: AC
  Filled 2018-06-11: qty 10

## 2018-06-11 MED ORDER — VECURONIUM BROMIDE 10 MG IV SOLR
INTRAVENOUS | Status: DC | PRN
  Administered 2018-06-11 (×3): 5 mg via INTRAVENOUS
  Administered 2018-06-11: 2 mg via INTRAVENOUS

## 2018-06-11 MED ORDER — INSULIN REGULAR BOLUS VIA INFUSION
0.0000 [IU] | Freq: Three times a day (TID) | INTRAVENOUS | Status: DC
  Filled 2018-06-11: qty 10

## 2018-06-11 MED ORDER — LACTATED RINGERS IV SOLN
INTRAVENOUS | Status: DC | PRN
  Administered 2018-06-11: 08:00:00 via INTRAVENOUS

## 2018-06-11 MED ORDER — ACETAMINOPHEN 650 MG RE SUPP
650.0000 mg | Freq: Once | RECTAL | Status: AC
  Administered 2018-06-11: 650 mg via RECTAL

## 2018-06-11 MED ORDER — MIDAZOLAM HCL 5 MG/5ML IJ SOLN
INTRAMUSCULAR | Status: DC | PRN
  Administered 2018-06-11: 1 mg via INTRAVENOUS
  Administered 2018-06-11: 2 mg via INTRAVENOUS
  Administered 2018-06-11: 1 mg via INTRAVENOUS
  Administered 2018-06-11: 2 mg via INTRAVENOUS
  Administered 2018-06-11: 1 mg via INTRAVENOUS
  Administered 2018-06-11: 2 mg via INTRAVENOUS
  Administered 2018-06-11: 1 mg via INTRAVENOUS
  Administered 2018-06-11: 2 mg via INTRAVENOUS

## 2018-06-11 MED ORDER — SODIUM CHLORIDE 0.9 % IV SOLN
INTRAVENOUS | Status: DC
  Administered 2018-06-11: 10 mL via INTRAVENOUS

## 2018-06-11 MED ORDER — SODIUM CHLORIDE (PF) 0.9 % IJ SOLN
INTRAMUSCULAR | Status: AC
  Filled 2018-06-11: qty 10

## 2018-06-11 MED ORDER — MIDAZOLAM HCL 2 MG/2ML IJ SOLN
INTRAMUSCULAR | Status: AC
  Filled 2018-06-11: qty 2

## 2018-06-11 MED ORDER — HEPARIN SODIUM (PORCINE) 1000 UNIT/ML IJ SOLN
INTRAMUSCULAR | Status: DC | PRN
  Administered 2018-06-11: 30000 [IU] via INTRAVENOUS

## 2018-06-11 MED ORDER — ROCURONIUM BROMIDE 50 MG/5ML IV SOSY
PREFILLED_SYRINGE | INTRAVENOUS | Status: AC
  Filled 2018-06-11: qty 10

## 2018-06-11 MED ORDER — ACETAMINOPHEN 160 MG/5ML PO SOLN: 1000.0000 mg | Freq: Four times a day (QID) | ORAL | Status: DC

## 2018-06-11 MED ORDER — ALBUMIN HUMAN 5 % IV SOLN
INTRAVENOUS | Status: DC | PRN
  Administered 2018-06-11: 14:00:00 via INTRAVENOUS

## 2018-06-11 MED ORDER — DEXMEDETOMIDINE HCL IN NACL 200 MCG/50ML IV SOLN
INTRAVENOUS | Status: AC
  Filled 2018-06-11: qty 50

## 2018-06-11 MED ORDER — INSULIN REGULAR(HUMAN) IN NACL 100-0.9 UT/100ML-% IV SOLN
INTRAVENOUS | Status: DC
  Administered 2018-06-11: 4.3 [IU]/h via INTRAVENOUS

## 2018-06-11 MED ORDER — DOCUSATE SODIUM 100 MG PO CAPS
200.0000 mg | ORAL_CAPSULE | Freq: Every day | ORAL | Status: DC
  Administered 2018-06-12 – 2018-06-14 (×3): 200 mg via ORAL
  Filled 2018-06-11 (×3): qty 2

## 2018-06-11 MED ORDER — ASPIRIN EC 325 MG PO TBEC
325.0000 mg | DELAYED_RELEASE_TABLET | Freq: Every day | ORAL | Status: DC
  Administered 2018-06-12: 325 mg via ORAL
  Filled 2018-06-11 (×2): qty 1

## 2018-06-11 MED ORDER — BISACODYL 5 MG PO TBEC
10.0000 mg | DELAYED_RELEASE_TABLET | Freq: Every day | ORAL | Status: DC
  Administered 2018-06-12 – 2018-06-14 (×3): 10 mg via ORAL
  Filled 2018-06-11 (×3): qty 2

## 2018-06-11 MED ORDER — 0.9 % SODIUM CHLORIDE (POUR BTL) OPTIME
TOPICAL | Status: DC | PRN
  Administered 2018-06-11: 6000 mL

## 2018-06-11 MED ORDER — PROPOFOL 10 MG/ML IV BOLUS
INTRAVENOUS | Status: AC
  Filled 2018-06-11: qty 20

## 2018-06-11 MED ORDER — SODIUM CHLORIDE 0.9% FLUSH
3.0000 mL | Freq: Two times a day (BID) | INTRAVENOUS | Status: DC
  Administered 2018-06-12 – 2018-06-15 (×7): 3 mL via INTRAVENOUS

## 2018-06-11 MED ORDER — PANTOPRAZOLE SODIUM 40 MG PO TBEC
40.0000 mg | DELAYED_RELEASE_TABLET | Freq: Every day | ORAL | Status: DC
  Administered 2018-06-13 – 2018-06-14 (×2): 40 mg via ORAL
  Filled 2018-06-11 (×2): qty 1

## 2018-06-11 MED ORDER — SODIUM CHLORIDE 0.45 % IV SOLN
INTRAVENOUS | Status: DC | PRN
  Administered 2018-06-11: 15:00:00 via INTRAVENOUS

## 2018-06-11 MED ORDER — FAMOTIDINE IN NACL 20-0.9 MG/50ML-% IV SOLN
20.0000 mg | Freq: Two times a day (BID) | INTRAVENOUS | Status: AC
  Administered 2018-06-11 – 2018-06-12 (×2): 20 mg via INTRAVENOUS
  Filled 2018-06-11: qty 50

## 2018-06-11 MED ORDER — AMIODARONE HCL IN DEXTROSE 360-4.14 MG/200ML-% IV SOLN
30.0000 mg/h | INTRAVENOUS | Status: DC
  Administered 2018-06-11 – 2018-06-12 (×2): 30 mg/h via INTRAVENOUS
  Filled 2018-06-11 (×4): qty 200

## 2018-06-11 MED ORDER — FENTANYL CITRATE (PF) 250 MCG/5ML IJ SOLN
INTRAMUSCULAR | Status: AC
  Filled 2018-06-11: qty 25

## 2018-06-11 MED ORDER — PROTAMINE SULFATE 10 MG/ML IV SOLN
INTRAVENOUS | Status: AC
  Filled 2018-06-11: qty 25

## 2018-06-11 MED ORDER — PROPOFOL 10 MG/ML IV BOLUS
INTRAVENOUS | Status: DC | PRN
  Administered 2018-06-11: 100 mg via INTRAVENOUS
  Administered 2018-06-11: 30 mg via INTRAVENOUS

## 2018-06-11 MED ORDER — ONDANSETRON HCL 4 MG/2ML IJ SOLN
4.0000 mg | Freq: Four times a day (QID) | INTRAMUSCULAR | Status: DC | PRN
  Administered 2018-06-14: 4 mg via INTRAVENOUS
  Filled 2018-06-11: qty 2

## 2018-06-11 MED ORDER — DEXMEDETOMIDINE HCL IN NACL 200 MCG/50ML IV SOLN
0.0000 ug/kg/h | INTRAVENOUS | Status: DC
  Administered 2018-06-11: 0.5 ug/kg/h via INTRAVENOUS
  Administered 2018-06-12: 0.2 ug/kg/h via INTRAVENOUS
  Filled 2018-06-11: qty 50

## 2018-06-11 MED ORDER — VECURONIUM BROMIDE 10 MG IV SOLR
INTRAVENOUS | Status: AC
  Filled 2018-06-11: qty 10

## 2018-06-11 MED ORDER — SODIUM CHLORIDE 0.9% FLUSH: 3.0000 mL | INTRAVENOUS | Status: DC | PRN

## 2018-06-11 MED ORDER — ACETAMINOPHEN 500 MG PO TABS
1000.0000 mg | ORAL_TABLET | Freq: Four times a day (QID) | ORAL | Status: DC
  Administered 2018-06-12 – 2018-06-15 (×13): 1000 mg via ORAL
  Filled 2018-06-11 (×13): qty 2

## 2018-06-11 MED ORDER — MIDAZOLAM HCL (PF) 10 MG/2ML IJ SOLN
INTRAMUSCULAR | Status: AC
  Filled 2018-06-11: qty 2

## 2018-06-11 MED ORDER — PLASMA-LYTE 148 IV SOLN
INTRAVENOUS | Status: DC | PRN
  Administered 2018-06-11: 500 mL via INTRAVASCULAR

## 2018-06-11 MED ORDER — MAGNESIUM SULFATE 4 GM/100ML IV SOLN
4.0000 g | Freq: Once | INTRAVENOUS | Status: AC
  Administered 2018-06-11: 4 g via INTRAVENOUS
  Filled 2018-06-11: qty 100

## 2018-06-11 MED ORDER — DOPAMINE-DEXTROSE 3.2-5 MG/ML-% IV SOLN
3.0000 ug/kg/min | INTRAVENOUS | Status: DC
  Administered 2018-06-11: 3 ug/kg/min via INTRAVENOUS
  Filled 2018-06-11: qty 250

## 2018-06-11 MED ORDER — MIDAZOLAM HCL 2 MG/2ML IJ SOLN: 2.0000 mg | INTRAMUSCULAR | Status: DC | PRN

## 2018-06-11 MED ORDER — MORPHINE SULFATE (PF) 2 MG/ML IV SOLN
1.0000 mg | INTRAVENOUS | Status: DC | PRN
  Administered 2018-06-11 (×2): 4 mg via INTRAVENOUS
  Administered 2018-06-11 – 2018-06-12 (×2): 2 mg via INTRAVENOUS
  Filled 2018-06-11: qty 2
  Filled 2018-06-11: qty 1
  Filled 2018-06-11: qty 2
  Filled 2018-06-11: qty 1

## 2018-06-11 MED ORDER — PHENYLEPHRINE 40 MCG/ML (10ML) SYRINGE FOR IV PUSH (FOR BLOOD PRESSURE SUPPORT)
PREFILLED_SYRINGE | INTRAVENOUS | Status: DC | PRN
  Administered 2018-06-11: 80 ug via INTRAVENOUS

## 2018-06-11 MED ORDER — HEMOSTATIC AGENTS (NO CHARGE) OPTIME
TOPICAL | Status: DC | PRN
  Administered 2018-06-11 (×3): 1 via TOPICAL
  Administered 2018-06-11: 3 via TOPICAL

## 2018-06-11 MED ORDER — ALBUMIN HUMAN 5 % IV SOLN
250.0000 mL | INTRAVENOUS | Status: AC | PRN
  Administered 2018-06-11 – 2018-06-12 (×4): 12.5 g via INTRAVENOUS
  Filled 2018-06-11: qty 500

## 2018-06-11 MED ORDER — SODIUM CHLORIDE 0.9 % IV SOLN
1.5000 g | Freq: Two times a day (BID) | INTRAVENOUS | Status: AC
  Administered 2018-06-11 – 2018-06-13 (×4): 1.5 g via INTRAVENOUS
  Filled 2018-06-11 (×4): qty 1.5

## 2018-06-11 MED ORDER — ASPIRIN 81 MG PO CHEW: 324.0000 mg | CHEWABLE_TABLET | Freq: Every day | ORAL | Status: DC

## 2018-06-11 MED ORDER — ROCURONIUM BROMIDE 10 MG/ML (PF) SYRINGE
PREFILLED_SYRINGE | INTRAVENOUS | Status: DC | PRN
  Administered 2018-06-11: 100 mg via INTRAVENOUS

## 2018-06-11 MED ORDER — LACTATED RINGERS IV SOLN
INTRAVENOUS | Status: DC
  Administered 2018-06-12: 11:00:00 via INTRAVENOUS

## 2018-06-11 MED ORDER — NITROGLYCERIN 0.2 MG/ML ON CALL CATH LAB
INTRAVENOUS | Status: DC | PRN
  Administered 2018-06-11: 20 ug via INTRAVENOUS

## 2018-06-11 MED ORDER — SUCCINYLCHOLINE CHLORIDE 200 MG/10ML IV SOSY
PREFILLED_SYRINGE | INTRAVENOUS | Status: AC
  Filled 2018-06-11: qty 10

## 2018-06-11 MED ORDER — NITROGLYCERIN IN D5W 200-5 MCG/ML-% IV SOLN
0.0000 ug/min | INTRAVENOUS | Status: DC
  Administered 2018-06-11: 0 ug/min via INTRAVENOUS

## 2018-06-11 MED ORDER — OXYCODONE HCL 5 MG PO TABS
5.0000 mg | ORAL_TABLET | ORAL | Status: DC | PRN
  Administered 2018-06-11 – 2018-06-15 (×8): 10 mg via ORAL
  Filled 2018-06-11 (×8): qty 2

## 2018-06-11 MED ORDER — EPHEDRINE SULFATE-NACL 50-0.9 MG/10ML-% IV SOSY
PREFILLED_SYRINGE | INTRAVENOUS | Status: DC | PRN
  Administered 2018-06-11: 5 mg via INTRAVENOUS
  Administered 2018-06-11: 10 mg via INTRAVENOUS

## 2018-06-11 MED ORDER — LACTATED RINGERS IV SOLN
INTRAVENOUS | Status: DC | PRN
  Administered 2018-06-11 (×2): via INTRAVENOUS

## 2018-06-11 MED ORDER — LACTATED RINGERS IV SOLN: INTRAVENOUS | Status: DC

## 2018-06-11 MED ORDER — HEPARIN SODIUM (PORCINE) 1000 UNIT/ML IJ SOLN
INTRAMUSCULAR | Status: AC
  Filled 2018-06-11: qty 1

## 2018-06-11 MED ORDER — ACETAMINOPHEN 160 MG/5ML PO SOLN: 650.0000 mg | Freq: Once | ORAL | Status: AC

## 2018-06-11 MED ORDER — BISACODYL 10 MG RE SUPP: 10.0000 mg | Freq: Every day | RECTAL | Status: DC

## 2018-06-11 MED ORDER — AMIODARONE HCL IN DEXTROSE 360-4.14 MG/200ML-% IV SOLN
60.0000 mg/h | INTRAVENOUS | Status: DC
  Administered 2018-06-11: 60 mg/h via INTRAVENOUS

## 2018-06-11 MED ORDER — PROTAMINE SULFATE 10 MG/ML IV SOLN
INTRAVENOUS | Status: AC
  Filled 2018-06-11: qty 5

## 2018-06-11 MED ORDER — LACTATED RINGERS IV SOLN: 500.0000 mL | Freq: Once | INTRAVENOUS | Status: DC | PRN

## 2018-06-11 MED ORDER — CHLORHEXIDINE GLUCONATE 0.12 % MT SOLN
15.0000 mL | OROMUCOSAL | Status: AC
  Administered 2018-06-11: 15 mL via OROMUCOSAL

## 2018-06-11 MED ORDER — METOPROLOL TARTRATE 12.5 MG HALF TABLET
12.5000 mg | ORAL_TABLET | Freq: Two times a day (BID) | ORAL | Status: DC
  Administered 2018-06-12: 12.5 mg via ORAL
  Filled 2018-06-11 (×2): qty 1

## 2018-06-11 MED ORDER — TRAMADOL HCL 50 MG PO TABS
50.0000 mg | ORAL_TABLET | ORAL | Status: DC | PRN
  Administered 2018-06-12 (×3): 100 mg via ORAL
  Administered 2018-06-13 – 2018-06-14 (×2): 50 mg via ORAL
  Filled 2018-06-11: qty 2
  Filled 2018-06-11: qty 1
  Filled 2018-06-11 (×2): qty 2
  Filled 2018-06-11: qty 1

## 2018-06-11 MED ORDER — SODIUM CHLORIDE 0.9 % IV SOLN
INTRAVENOUS | Status: DC | PRN
  Administered 2018-06-11: 15:00:00 via INTRAVENOUS

## 2018-06-11 MED ORDER — SUCCINYLCHOLINE CHLORIDE 200 MG/10ML IV SOSY
PREFILLED_SYRINGE | INTRAVENOUS | Status: DC | PRN
  Administered 2018-06-11: 120 mg via INTRAVENOUS

## 2018-06-11 MED ORDER — FENTANYL CITRATE (PF) 250 MCG/5ML IJ SOLN
INTRAMUSCULAR | Status: DC | PRN
  Administered 2018-06-11: 150 ug via INTRAVENOUS
  Administered 2018-06-11: 100 ug via INTRAVENOUS
  Administered 2018-06-11: 150 ug via INTRAVENOUS
  Administered 2018-06-11: 50 ug via INTRAVENOUS
  Administered 2018-06-11: 150 ug via INTRAVENOUS
  Administered 2018-06-11: 50 ug via INTRAVENOUS
  Administered 2018-06-11: 100 ug via INTRAVENOUS
  Administered 2018-06-11: 200 ug via INTRAVENOUS
  Administered 2018-06-11 (×3): 100 ug via INTRAVENOUS

## 2018-06-11 MED ORDER — SODIUM CHLORIDE 0.9 % IV SOLN: 250.0000 mL | INTRAVENOUS | Status: DC

## 2018-06-11 MED ORDER — PHENYLEPHRINE HCL-NACL 20-0.9 MG/250ML-% IV SOLN
0.0000 ug/min | INTRAVENOUS | Status: DC
  Administered 2018-06-11: 10 ug/min via INTRAVENOUS
  Filled 2018-06-11: qty 250

## 2018-06-11 MED ORDER — PROTAMINE SULFATE 10 MG/ML IV SOLN
INTRAVENOUS | Status: DC | PRN
  Administered 2018-06-11: 300 mg via INTRAVENOUS

## 2018-06-11 MED ORDER — SODIUM CHLORIDE 0.9 % IV SOLN
INTRAVENOUS | Status: DC | PRN
  Administered 2018-06-11: 750 mg via INTRAVENOUS

## 2018-06-11 MED ORDER — POTASSIUM CHLORIDE 10 MEQ/50ML IV SOLN
10.0000 meq | INTRAVENOUS | Status: AC
  Administered 2018-06-11 (×3): 10 meq via INTRAVENOUS

## 2018-06-11 MED ORDER — VANCOMYCIN HCL IN DEXTROSE 1-5 GM/200ML-% IV SOLN
1000.0000 mg | Freq: Once | INTRAVENOUS | Status: AC
  Administered 2018-06-11: 1000 mg via INTRAVENOUS
  Filled 2018-06-11: qty 200

## 2018-06-11 MED FILL — Sodium Chloride IV Soln 0.9%: INTRAVENOUS | Qty: 250 | Status: AC

## 2018-06-11 MED FILL — Phenylephrine HCl IV Soln 10 MG/ML: INTRAVENOUS | Qty: 2 | Status: AC

## 2018-06-11 SURGICAL SUPPLY — 80 items
ATRICLIP EXCLUSION VLAA 35 (Miscellaneous) ×3 IMPLANT
BAG DECANTER FOR FLEXI CONT (MISCELLANEOUS) ×3 IMPLANT
BANDAGE ACE 4X5 VEL STRL LF (GAUZE/BANDAGES/DRESSINGS) ×3 IMPLANT
BANDAGE ACE 6X5 VEL STRL LF (GAUZE/BANDAGES/DRESSINGS) ×3 IMPLANT
BANDAGE ELASTIC 4 VELCRO ST LF (GAUZE/BANDAGES/DRESSINGS) ×1 IMPLANT
BANDAGE ELASTIC 6 VELCRO ST LF (GAUZE/BANDAGES/DRESSINGS) ×1 IMPLANT
BLADE STERNUM SYSTEM 6 (BLADE) ×3 IMPLANT
BNDG GAUZE ELAST 4 BULKY (GAUZE/BANDAGES/DRESSINGS) ×3 IMPLANT
CANISTER SUCT 3000ML PPV (MISCELLANEOUS) ×3 IMPLANT
CARDIOBLATE CARDIAC ABLATION (MISCELLANEOUS)
CATH CPB KIT GERHARDT (MISCELLANEOUS) ×3 IMPLANT
CATH ROBINSON RED A/P 18FR (CATHETERS) ×1 IMPLANT
CATH THORACIC 28FR (CATHETERS) ×3 IMPLANT
CLAMP ISOLATOR SYNERGY LG (MISCELLANEOUS) ×1 IMPLANT
COVER WAND RF STERILE (DRAPES) ×2 IMPLANT
CRADLE DONUT ADULT HEAD (MISCELLANEOUS) ×3 IMPLANT
DEVICE CARDIOBLATE CARDIAC ABL (MISCELLANEOUS) IMPLANT
DEVICE EXCLUSIN ATRCLP VLAA 35 (Miscellaneous) IMPLANT
DRAIN CHANNEL 28F RND 3/8 FF (WOUND CARE) ×3 IMPLANT
DRAPE CARDIOVASCULAR INCISE (DRAPES) ×3
DRAPE SLUSH/WARMER DISC (DRAPES) ×3 IMPLANT
DRAPE SRG 135X102X78XABS (DRAPES) ×2 IMPLANT
DRSG AQUACEL AG ADV 3.5X14 (GAUZE/BANDAGES/DRESSINGS) ×3 IMPLANT
ELECT BLADE 4.0 EZ CLEAN MEGAD (MISCELLANEOUS) ×3
ELECT REM PT RETURN 9FT ADLT (ELECTROSURGICAL) ×6
ELECTRODE BLDE 4.0 EZ CLN MEGD (MISCELLANEOUS) ×2 IMPLANT
ELECTRODE REM PT RTRN 9FT ADLT (ELECTROSURGICAL) ×4 IMPLANT
FELT TEFLON 1X6 (MISCELLANEOUS) ×5 IMPLANT
GAUZE SPONGE 4X4 12PLY STRL (GAUZE/BANDAGES/DRESSINGS) ×6 IMPLANT
GAUZE SPONGE 4X4 12PLY STRL LF (GAUZE/BANDAGES/DRESSINGS) ×2 IMPLANT
GLOVE BIO SURGEON STRL SZ 6 (GLOVE) ×4 IMPLANT
GLOVE BIO SURGEON STRL SZ 6.5 (GLOVE) ×11 IMPLANT
GLOVE BIO SURGEON STRL SZ7.5 (GLOVE) ×2 IMPLANT
GLOVE BIOGEL PI IND STRL 6 (GLOVE) IMPLANT
GLOVE BIOGEL PI IND STRL 6.5 (GLOVE) IMPLANT
GLOVE BIOGEL PI INDICATOR 6 (GLOVE) ×8
GLOVE BIOGEL PI INDICATOR 6.5 (GLOVE) ×5
GLOVE INDICATOR 8.0 STRL GRN (GLOVE) ×1 IMPLANT
GOWN STRL REUS W/ TWL LRG LVL3 (GOWN DISPOSABLE) ×8 IMPLANT
GOWN STRL REUS W/TWL LRG LVL3 (GOWN DISPOSABLE) ×24
HEMOSTAT POWDER SURGIFOAM 1G (HEMOSTASIS) ×9 IMPLANT
HEMOSTAT SURGICEL 2X14 (HEMOSTASIS) ×4 IMPLANT
KIT BASIN OR (CUSTOM PROCEDURE TRAY) ×3 IMPLANT
KIT CATH SUCT 8FR (CATHETERS) ×3 IMPLANT
KIT SUCTION CATH 14FR (SUCTIONS) ×7 IMPLANT
KIT TURNOVER KIT B (KITS) ×3 IMPLANT
KIT VASOVIEW HEMOPRO 2 VH 4000 (KITS) ×3 IMPLANT
LEAD PACING MYOCARDI (MISCELLANEOUS) ×3 IMPLANT
LINE VENT (MISCELLANEOUS) ×1 IMPLANT
MARKER GRAFT CORONARY BYPASS (MISCELLANEOUS) ×9 IMPLANT
NS IRRIG 1000ML POUR BTL (IV SOLUTION) ×16 IMPLANT
PACK E OPEN HEART (SUTURE) ×3 IMPLANT
PACK OPEN HEART (CUSTOM PROCEDURE TRAY) ×3 IMPLANT
PAD ARMBOARD 7.5X6 YLW CONV (MISCELLANEOUS) ×6 IMPLANT
PAD ELECT DEFIB RADIOL ZOLL (MISCELLANEOUS) ×3 IMPLANT
PENCIL BUTTON HOLSTER BLD 10FT (ELECTRODE) ×3 IMPLANT
PROBE CRYO2-ABLATION MALLABLE (MISCELLANEOUS) IMPLANT
SET CARDIOPLEGIA MPS 5001102 (MISCELLANEOUS) ×1 IMPLANT
SPONGE LAP 18X18 RF (DISPOSABLE) ×1 IMPLANT
SPONGE LAP 4X18 RFD (DISPOSABLE) ×1 IMPLANT
SURGIFLO W/THROMBIN 8M KIT (HEMOSTASIS) ×1 IMPLANT
SUT BONE WAX W31G (SUTURE) ×3 IMPLANT
SUT PROLENE 3 0 SH1 36 (SUTURE) ×3 IMPLANT
SUT PROLENE 4 0 TF (SUTURE) ×6 IMPLANT
SUT PROLENE 6 0 CC (SUTURE) ×7 IMPLANT
SUT PROLENE 7 0 BV1 MDA (SUTURE) ×4 IMPLANT
SUT PROLENE 7.0 RB 3 (SUTURE) ×2 IMPLANT
SUT PROLENE 8 0 BV175 6 (SUTURE) ×8 IMPLANT
SUT STEEL 6MS V (SUTURE) ×3 IMPLANT
SUT STEEL SZ 6 DBL 3X14 BALL (SUTURE) ×3 IMPLANT
SUT VIC AB 1 CTX 18 (SUTURE) ×6 IMPLANT
SYSTEM SAHARA CHEST DRAIN ATS (WOUND CARE) ×3 IMPLANT
TAPE CLOTH SURG 4X10 WHT LF (GAUZE/BANDAGES/DRESSINGS) ×1 IMPLANT
TAPE PAPER 2X10 WHT MICROPORE (GAUZE/BANDAGES/DRESSINGS) ×1 IMPLANT
TOWEL GREEN STERILE (TOWEL DISPOSABLE) ×3 IMPLANT
TOWEL GREEN STERILE FF (TOWEL DISPOSABLE) ×2 IMPLANT
TRAY FOLEY SLVR 16FR TEMP STAT (SET/KITS/TRAYS/PACK) ×3 IMPLANT
TUBING INSUFFLATION (TUBING) ×3 IMPLANT
UNDERPAD 30X30 (UNDERPADS AND DIAPERS) ×3 IMPLANT
WATER STERILE IRR 1000ML POUR (IV SOLUTION) ×6 IMPLANT

## 2018-06-11 NOTE — Anesthesia Procedure Notes (Signed)
Arterial Line Insertion Start/End1/15/2020 7:50 AM, 06/11/2018 7:55 AM Performed by: Colin Benton, CRNA, CRNA  Patient location: Pre-op. Preanesthetic checklist: patient identified, IV checked, site marked, risks and benefits discussed, surgical consent, monitors and equipment checked, pre-op evaluation, timeout performed and anesthesia consent Lidocaine 1% used for infiltration and patient sedated Left, radial was placed Catheter size: 20 G Hand hygiene performed , maximum sterile barriers used  and Seldinger technique used Allen's test indicative of satisfactory collateral circulation Attempts: 2 Procedure performed without using ultrasound guided technique. Following insertion, dressing applied and Biopatch. Post procedure assessment: normal and unchanged  Patient tolerated the procedure well with no immediate complications.

## 2018-06-11 NOTE — Progress Notes (Signed)
      [address]       [place] [address]             [phone]     Pre Procedure note for inpatients:   Ryan Wagner has been scheduled for Procedure(s): CORONARY ARTERY BYPASS GRAFTING (CABG) (N/A) MAZE (N/A) TRANSESOPHAGEAL ECHOCARDIOGRAM (TEE) (N/A) POSSIBLE CLIPPING OF ATRIAL APPENDAGE (N/A) today. The various methods of treatment have been discussed with the patient. After consideration of the risks, benefits and treatment options the patient has consented to the planned procedure.   The patient has been seen and labs reviewed. There are no changes in the patient's condition to prevent proceeding with the planned procedure today.  Recent labs:  Lab Results  Component Value Date   WBC 6.9 06/11/2018   HGB 14.6 06/11/2018   HCT 45.2 06/11/2018   PLT 208 06/11/2018   GLUCOSE 124 (H) 06/11/2018   ALT 26 06/10/2018   AST 19 06/10/2018   NA 141 06/11/2018   K 3.5 06/11/2018   CL 110 06/11/2018   CREATININE 0.71 06/11/2018   BUN 8 06/11/2018   CO2 22 06/11/2018   TSH 0.997 06/05/2018   INR 1.12 06/10/2018   HGBA1C 5.9 (H) 04/07/2018    Grace Isaac, MD 06/11/2018 7:29 AM

## 2018-06-11 NOTE — Progress Notes (Signed)
Echocardiogram TEE has been performed.  Ryan Wagner 06/11/2018, 10:45 AM

## 2018-06-11 NOTE — Anesthesia Procedure Notes (Signed)
Procedure Name: Intubation Date/Time: 06/11/2018 8:48 AM Performed by: Colin Benton, CRNA Pre-anesthesia Checklist: Patient identified, Emergency Drugs available, Suction available and Patient being monitored Patient Re-evaluated:Patient Re-evaluated prior to induction Oxygen Delivery Method: Circle system utilized Preoxygenation: Pre-oxygenation with 100% oxygen Induction Type: IV induction Ventilation: Mask ventilation without difficulty Laryngoscope Size: Glidescope and 4 Grade View: Grade I Tube type: Oral Tube size: 8.0 mm Number of attempts: 1 Airway Equipment and Method: Video-laryngoscopy and Rigid stylet Placement Confirmation: ETT inserted through vocal cords under direct vision,  positive ETCO2 and breath sounds checked- equal and bilateral Secured at: 23 cm Tube secured with: Tape Dental Injury: Teeth and Oropharynx as per pre-operative assessment  Difficulty Due To: Difficulty was anticipated and Difficult Airway- due to reduced neck mobility

## 2018-06-11 NOTE — Progress Notes (Signed)
      [address]       [place] [address]             [phone]      S/p CABG, Maze, LA clip  BP (!) 83/55   Pulse 88   Temp (!) 97.3 F (36.3 C)   Resp 14   Ht 5\' 7"  (1.702 m)   Wt 86.3 kg   SpO2 99%   BMI 29.79 kg/m   27/16 CI= 2.5 on dopamine @ 3  Intake/Output Summary (Last 24 hours) at 06/11/2018 1814 Last data filed at 06/11/2018 1800 Gross per 24 hour  Intake 4152.18 ml  Output 3705 ml  Net 447.18 ml   Minimal CT output  Starting to wean  Doing well early postop  Remo Lipps C. Roxan Hockey, MD [facility] [phone]

## 2018-06-11 NOTE — Transfer of Care (Signed)
Immediate Anesthesia Transfer of Care Note  Patient: Ryan Wagner  Procedure(s) Performed: CORONARY ARTERY BYPASS GRAFTING (CABG) x4 using left internal mammary artery and right greater saphenous vein harvested endoscopically. LIMA to distal LAD, Seq SVG to 1st & 2nd OM, SVG to distal PD (N/A Chest) Pulmonary isolation MAZE using bipolar ablation (N/A ) TRANSESOPHAGEAL ECHOCARDIOGRAM (TEE) (N/A ) CLIPPING OF LEFT ATRIAL APPENDAGE using a 35 AtriCure Clip (N/A )  Patient Location: 2H ICU  Anesthesia Type:General  Level of Consciousness: sedated and Patient remains intubated per anesthesia plan  Airway & Oxygen Therapy: Patient remains intubated per anesthesia plan and Patient placed on Ventilator (see vital sign flow sheet for setting)  Post-op Assessment: Report given to RN and Post -op Vital signs reviewed and stable  Post vital signs: Reviewed and stable  Last Vitals:  Vitals Value Taken Time  BP 70/47 06/11/2018  3:31 PM  Temp    Pulse 88 06/11/2018  3:37 PM  Resp 14 06/11/2018  3:37 PM  SpO2 97 % 06/11/2018  3:37 PM  Vitals shown include unvalidated device data.  Last Pain:  Vitals:   06/11/18 0422  TempSrc: Oral  PainSc:       Patients Stated Pain Goal: 4 (01/65/53 7482)  Complications: No apparent anesthesia complications

## 2018-06-11 NOTE — Progress Notes (Signed)
Call report to [phone] for the OR

## 2018-06-11 NOTE — Plan of Care (Signed)
  Problem: Education: Goal: Knowledge of General Education information will improve Description Including pain rating scale, medication(s)/side effects and non-pharmacologic comfort measures Outcome: Progressing   Problem: Health Behavior/Discharge Planning: Goal: Ability to manage health-related needs will improve Outcome: Progressing   Problem: Clinical Measurements: Goal: Ability to maintain clinical measurements within normal limits will improve Outcome: Progressing Goal: Will remain free from infection Outcome: Progressing Goal: Diagnostic test results will improve Outcome: Progressing Goal: Respiratory complications will improve Outcome: Progressing Goal: Cardiovascular complication will be avoided Outcome: Progressing   Problem: Activity: Goal: Risk for activity intolerance will decrease Outcome: Progressing   Problem: Coping: Goal: Level of anxiety will decrease Outcome: Progressing   Problem: Elimination: Goal: Will not experience complications related to bowel motility Outcome: Progressing Goal: Will not experience complications related to urinary retention Outcome: Progressing   Problem: Pain Managment: Goal: General experience of comfort will improve Outcome: Progressing   Problem: Safety: Goal: Ability to remain free from injury will improve Outcome: Progressing   Problem: Skin Integrity: Goal: Risk for impaired skin integrity will decrease Outcome: Progressing   Problem: Education: Goal: Will demonstrate proper wound care and an understanding of methods to prevent future damage Outcome: Progressing Goal: Knowledge of disease or condition will improve Outcome: Progressing

## 2018-06-11 NOTE — Anesthesia Postprocedure Evaluation (Signed)
Anesthesia Post Note  Patient: Ryan Wagner  Procedure(s) Performed: CORONARY ARTERY BYPASS GRAFTING (CABG) x4 using left internal mammary artery and right greater saphenous vein harvested endoscopically. LIMA to distal LAD, Seq SVG to 1st & 2nd OM, SVG to distal PD (N/A Chest) Pulmonary isolation MAZE using bipolar ablation (N/A ) TRANSESOPHAGEAL ECHOCARDIOGRAM (TEE) (N/A ) CLIPPING OF LEFT ATRIAL APPENDAGE using a 35 AtriCure Clip (N/A )     Patient location during evaluation: SICU Anesthesia Type: General Level of consciousness: sedated Pain management: pain level controlled Vital Signs Assessment: post-procedure vital signs reviewed and stable Respiratory status: patient remains intubated per anesthesia plan Cardiovascular status: stable Postop Assessment: no apparent nausea or vomiting Anesthetic complications: no    Last Vitals:  Vitals:   06/11/18 0422 06/11/18 1525  BP: (!) 146/62 (!) 88/50  Pulse: 75 90  Resp: 20 14  Temp: 36.7 C   SpO2: 98% 96%    Last Pain:  Vitals:   06/11/18 0422  TempSrc: Oral  PainSc:                  Andilynn Delavega,W. EDMOND

## 2018-06-11 NOTE — Procedures (Signed)
Extubation Procedure Note  Patient Details:   Name: Ryan Wagner DOB: 12-May-1946 MRN: [id number]   Airway Documentation:    Vent end date: 06/11/18 Vent end time: 1850   Evaluation  O2 sats: stable throughout Complications: No apparent complications Patient did tolerate procedure well. Bilateral Breath Sounds: Clear   Yes   Pt extubated to 4L nasal cannula. No stridor heard. NIF -30, Vital Capacity 865ml. Pt tolerated procedure well. Incentive spirometer started and encouraged. Pt and wife taught and verbalizes understanding.   Sharla Kidney 06/11/2018, 7:02 PM

## 2018-06-11 NOTE — Anesthesia Procedure Notes (Signed)
Central Venous Catheter Insertion Performed by: Roderic Palau, MD, anesthesiologist Start/End1/15/2020 8:00 AM, 06/11/2018 8:15 AM Patient location: Pre-op. Preanesthetic checklist: patient identified, IV checked, site marked, risks and benefits discussed, surgical consent, monitors and equipment checked, pre-op evaluation, timeout performed and anesthesia consent Hand hygiene performed  and maximum sterile barriers used  PA cath was placed.Swan type:thermodilution PA Cath depth:50 Procedure performed without using ultrasound guided technique. Attempts: 1 Patient tolerated the procedure well with no immediate complications.

## 2018-06-11 NOTE — Brief Op Note (Signed)
      [address]       [place] [address]             [phone]      06/11/2018  4:19 PM  PATIENT:  Ryan Wagner  73 y.o. male  PRE-OPERATIVE DIAGNOSIS:  three vessel CAD, hx of intermittent Afib  POST-OPERATIVE DIAGNOSIS:  three vessel CAD, hx of intermittent Afib  PROCEDURE:  Procedure(s): CORONARY ARTERY BYPASS GRAFTING (CABG) x4 using left internal mammary artery and right greater saphenous vein harvested endoscopically. LIMA to distal LAD, Seq SVG to 1st & 2nd OM, SVG to distal PD (N/A) Pulmonary isolation MAZE using bipolar ablation (N/A) TRANSESOPHAGEAL ECHOCARDIOGRAM (TEE) (N/A) CLIPPING OF LEFT ATRIAL APPENDAGE using a 35 AtriCure Clip (N/A)   SURGEON:  Surgeon(s) and Role:    * Grace Isaac, MD - Primary  PHYSICIAN ASSISTANT: WAYNE GOLD PA-C  ANESTHESIA:   general  EBL:  1650 mL   BLOOD ADMINISTERED:none  DRAINS: ROUTINE PLEURAL AND PERICARDIAL DRAINS   LOCAL MEDICATIONS USED:  NONE  SPECIMEN:  No Specimen  DISPOSITION OF SPECIMEN:  N/A  COUNTS:  YES   DICTATION: .Other Dictation: Dictation Number PENDING  PLAN OF CARE: Admit to inpatient   PATIENT DISPOSITION:  ICU - intubated and hemodynamically stable.   Delay start of Pharmacological VTE agent (>24hrs) due to surgical blood loss or risk of bleeding: yes  COMPLICATIONS: NO KNOWN, distal vessels very poor quality especially the LAD and pda

## 2018-06-11 NOTE — Anesthesia Procedure Notes (Signed)
Central Venous Catheter Insertion Performed by: Roderic Palau, MD, anesthesiologist Start/End1/15/2020 8:00 AM, 06/11/2018 8:15 AM Patient location: Pre-op. Preanesthetic checklist: patient identified, IV checked, site marked, risks and benefits discussed, surgical consent, monitors and equipment checked, pre-op evaluation, timeout performed and anesthesia consent Position: Trendelenburg Lidocaine 1% used for infiltration and patient sedated Hand hygiene performed , maximum sterile barriers used  and Seldinger technique used Catheter size: 9 Fr Total catheter length 10. Central line was placed.MAC introducer Swan type:thermodilution Procedure performed using ultrasound guided technique. Ultrasound Notes:anatomy identified, needle tip was noted to be adjacent to the nerve/plexus identified, no ultrasound evidence of intravascular and/or intraneural injection and image(s) printed for medical record Attempts: 1 Following insertion, line sutured, dressing applied and Biopatch. Post procedure assessment: blood return through all ports, free fluid flow and no air  Patient tolerated the procedure well with no immediate complications.

## 2018-06-12 ENCOUNTER — Inpatient Hospital Stay (HOSPITAL_COMMUNITY): Payer: HMO

## 2018-06-12 ENCOUNTER — Encounter (HOSPITAL_COMMUNITY): Payer: Self-pay | Admitting: Cardiothoracic Surgery

## 2018-06-12 LAB — GLUCOSE, CAPILLARY
Glucose-Capillary: 100 mg/dL — ABNORMAL HIGH (ref 70–99)
Glucose-Capillary: 101 mg/dL — ABNORMAL HIGH (ref 70–99)
Glucose-Capillary: 103 mg/dL — ABNORMAL HIGH (ref 70–99)
Glucose-Capillary: 103 mg/dL — ABNORMAL HIGH (ref 70–99)
Glucose-Capillary: 103 mg/dL — ABNORMAL HIGH (ref 70–99)
Glucose-Capillary: 110 mg/dL — ABNORMAL HIGH (ref 70–99)
Glucose-Capillary: 112 mg/dL — ABNORMAL HIGH (ref 70–99)
Glucose-Capillary: 113 mg/dL — ABNORMAL HIGH (ref 70–99)
Glucose-Capillary: 113 mg/dL — ABNORMAL HIGH (ref 70–99)
Glucose-Capillary: 114 mg/dL — ABNORMAL HIGH (ref 70–99)
Glucose-Capillary: 115 mg/dL — ABNORMAL HIGH (ref 70–99)
Glucose-Capillary: 137 mg/dL — ABNORMAL HIGH (ref 70–99)
Glucose-Capillary: 140 mg/dL — ABNORMAL HIGH (ref 70–99)
Glucose-Capillary: 146 mg/dL — ABNORMAL HIGH (ref 70–99)
Glucose-Capillary: 183 mg/dL — ABNORMAL HIGH (ref 70–99)
Glucose-Capillary: 94 mg/dL (ref 70–99)
Glucose-Capillary: 96 mg/dL (ref 70–99)
Glucose-Capillary: 99 mg/dL (ref 70–99)

## 2018-06-12 LAB — BASIC METABOLIC PANEL
Anion gap: 9 (ref 5–15)
BUN: 8 mg/dL (ref 8–23)
CO2: 21 mmol/L — ABNORMAL LOW (ref 22–32)
Calcium: 7.5 mg/dL — ABNORMAL LOW (ref 8.9–10.3)
Chloride: 111 mmol/L (ref 98–111)
Creatinine, Ser: 0.68 mg/dL (ref 0.61–1.24)
GFR calc Af Amer: >60 mL/min (ref >60)
GFR calc non Af Amer: >60 mL/min (ref >60)
Glucose, Bld: 107 mg/dL — ABNORMAL HIGH (ref 70–99)
Potassium: 3.9 mmol/L (ref 3.5–5.1)
Sodium: 141 mmol/L (ref 135–145)

## 2018-06-12 LAB — CBC
HCT: 33 % — ABNORMAL LOW (ref 39.0–52.0)
HCT: 30.8 % — ABNORMAL LOW (ref 39.0–52.0)
Hemoglobin: 11.1 g/dL — ABNORMAL LOW (ref 13.0–17.0)
Hemoglobin: 10 g/dL — ABNORMAL LOW (ref 13.0–17.0)
MCH: 29.1 pg (ref 26.0–34.0)
MCH: 28.6 pg (ref 26.0–34.0)
MCHC: 33.6 g/dL (ref 30.0–36.0)
MCHC: 32.5 g/dL (ref 30.0–36.0)
MCV: 86.4 fL (ref 80.0–100.0)
MCV: 88 fL (ref 80.0–100.0)
Platelets: 153 10*3/uL (ref 150–400)
Platelets: 130 10*3/uL — ABNORMAL LOW (ref 150–400)
RBC: 3.82 MIL/uL — ABNORMAL LOW (ref 4.22–5.81)
RBC: 3.5 MIL/uL — ABNORMAL LOW (ref 4.22–5.81)
RDW: 14.2 % (ref 11.5–15.5)
RDW: 14.9 % (ref 11.5–15.5)
WBC: 18.1 10*3/uL — ABNORMAL HIGH (ref 4.0–10.5)
WBC: 16.9 10*3/uL — ABNORMAL HIGH (ref 4.0–10.5)
nRBC: 0 % (ref 0.0–0.2)
nRBC: 0 % (ref 0.0–0.2)

## 2018-06-12 LAB — POCT I-STAT, CHEM 8
BUN: 12 mg/dL (ref 8–23)
Calcium, Ion: 1.1 mmol/L — ABNORMAL LOW (ref 1.15–1.40)
Chloride: 103 mmol/L (ref 98–111)
Creatinine, Ser: 0.9 mg/dL (ref 0.61–1.24)
Glucose, Bld: 156 mg/dL — ABNORMAL HIGH (ref 70–99)
HCT: 29 % — ABNORMAL LOW (ref 39.0–52.0)
Hemoglobin: 9.9 g/dL — ABNORMAL LOW (ref 13.0–17.0)
Potassium: 3.9 mmol/L (ref 3.5–5.1)
Sodium: 138 mmol/L (ref 135–145)
TCO2: 21 mmol/L — ABNORMAL LOW (ref 22–32)

## 2018-06-12 LAB — MAGNESIUM
Magnesium: 2.4 mg/dL (ref 1.7–2.4)
Magnesium: 2.3 mg/dL (ref 1.7–2.4)

## 2018-06-12 LAB — CREATININE, SERUM
Creatinine, Ser: 1.08 mg/dL (ref 0.61–1.24)
GFR calc Af Amer: >60 mL/min (ref >60)
GFR calc non Af Amer: >60 mL/min (ref >60)

## 2018-06-12 MED ORDER — SODIUM CHLORIDE 0.9% FLUSH: 10.0000 mL | INTRAVENOUS | Status: DC | PRN

## 2018-06-12 MED ORDER — CHLORHEXIDINE GLUCONATE CLOTH 2 % EX PADS
6.0000 | MEDICATED_PAD | Freq: Every day | CUTANEOUS | Status: DC
  Administered 2018-06-12 – 2018-06-14 (×2): 6 via TOPICAL

## 2018-06-12 MED ORDER — SODIUM CHLORIDE 0.9% FLUSH
10.0000 mL | Freq: Two times a day (BID) | INTRAVENOUS | Status: DC
  Administered 2018-06-12: 20 mL
  Administered 2018-06-12 – 2018-06-13 (×3): 10 mL

## 2018-06-12 MED ORDER — INSULIN ASPART 100 UNIT/ML ~~LOC~~ SOLN
0.0000 [IU] | SUBCUTANEOUS | Status: DC
  Administered 2018-06-12: 2 [IU] via SUBCUTANEOUS
  Administered 2018-06-12: 4 [IU] via SUBCUTANEOUS
  Administered 2018-06-12 – 2018-06-13 (×5): 2 [IU] via SUBCUTANEOUS

## 2018-06-12 MED ORDER — INSULIN DETEMIR 100 UNIT/ML ~~LOC~~ SOLN
15.0000 [IU] | Freq: Once | SUBCUTANEOUS | Status: AC
  Administered 2018-06-12: 15 [IU] via SUBCUTANEOUS
  Filled 2018-06-12 (×2): qty 0.15

## 2018-06-12 MED ORDER — INSULIN DETEMIR 100 UNIT/ML ~~LOC~~ SOLN
15.0000 [IU] | Freq: Every day | SUBCUTANEOUS | Status: DC
  Administered 2018-06-13 – 2018-06-14 (×2): 15 [IU] via SUBCUTANEOUS
  Filled 2018-06-12 (×3): qty 0.15

## 2018-06-12 MED ORDER — ENOXAPARIN SODIUM 30 MG/0.3ML ~~LOC~~ SOLN
30.0000 mg | Freq: Every day | SUBCUTANEOUS | Status: DC
  Administered 2018-06-12 – 2018-06-16 (×5): 30 mg via SUBCUTANEOUS
  Filled 2018-06-12 (×5): qty 0.3

## 2018-06-12 MED ORDER — FUROSEMIDE 10 MG/ML IJ SOLN
40.0000 mg | Freq: Once | INTRAMUSCULAR | Status: AC
  Administered 2018-06-12: 40 mg via INTRAVENOUS
  Filled 2018-06-12: qty 4

## 2018-06-12 NOTE — Progress Notes (Addendum)
[facility] DAILY ICU PROGRESS NOTE                   [address]            [place], [address]          [phone]   1 Day Post-Op Procedure(s) (LRB): CORONARY ARTERY BYPASS GRAFTING (CABG) x4 using left internal mammary artery and right greater saphenous vein harvested endoscopically. LIMA to distal LAD, Seq SVG to 1st & 2nd OM, SVG to distal PD (N/A) Pulmonary isolation MAZE using bipolar ablation (N/A) TRANSESOPHAGEAL ECHOCARDIOGRAM (TEE) (N/A) CLIPPING OF LEFT ATRIAL APPENDAGE using a 35 AtriCure Clip (N/A)  Total Length of Stay:  LOS: 1 day   Subjective: In excellent spirits, some mild soreness  Objective: Vital signs in last 24 hours: Temp:  [96.8 F (36 C)-99.5 F (37.5 C)] 99.5 F (37.5 C) (01/16 0600) Pulse Rate:  [87-90] 90 (01/16 0600) Cardiac Rhythm: A-V Sequential paced (01/15 1930) Resp:  [12-25] 25 (01/16 0600) BP: (68-117)/(47-80) 94/62 (01/16 0600) SpO2:  [96 %-100 %] 97 % (01/16 0600) Arterial Line BP: (85-134)/(49-64) 121/53 (01/16 0600) FiO2 (%):  [40 %-50 %] 40 % (01/15 1815) Weight:  [90.2 kg] 90.2 kg (01/16 0500)  Filed Weights   06/10/18 1456 06/11/18 0422 06/12/18 0500  Weight: 86.2 kg 86.3 kg 90.2 kg    Weight change: 4.017 kg   Hemodynamic parameters for last 24 hours: PAP: (19-35)/(5-25) 28/24 CO:  [3.9 L/min-5.3 L/min] 5.1 L/min CI:  [2 L/min/m2-2.7 L/min/m2] 2.6 L/min/m2  Intake/Output from previous day: 01/15 0701 - 01/16 0700 In: 5822.6 [I.V.:3758; Blood:780; IV Piggyback:1284.6] Out: [id number]; Blood:1650; Chest Tube:400]  Intake/Output this shift: Total I/O In: -  Out: 75 [Urine:35; Chest Tube:40]  Current Meds: Scheduled Meds: . acetaminophen  1,000 mg Oral Q6H   Or  . acetaminophen (TYLENOL) oral liquid 160 mg/5 mL  1,000 mg Per Tube Q6H  . aspirin EC  325 mg Oral Daily   Or  . aspirin  324 mg Per Tube Daily  . atorvastatin  80 mg Oral q1800  . bisacodyl  10 mg Oral Daily   Or  . bisacodyl  10  mg Rectal Daily  . Chlorhexidine Gluconate Cloth  6 each Topical Daily  . docusate sodium  200 mg Oral Daily  . insulin regular  0-10 Units Intravenous TID WC  . metoprolol tartrate  12.5 mg Oral BID   Or  . metoprolol tartrate  12.5 mg Per Tube BID  . [START ON 06/13/2018] pantoprazole  40 mg Oral Daily  . sodium chloride flush  10-40 mL Intracatheter Q12H  . sodium chloride flush  3 mL Intravenous Q12H   Continuous Infusions: . sodium chloride 20 mL/hr at 06/12/18 0600  . sodium chloride    . sodium chloride 10 mL (06/11/18 1525)  . amiodarone 30 mg/hr (06/12/18 0600)  . cefUROXime (ZINACEF)  IV 1.5 g (06/12/18 0624)  . dexmedetomidine (PRECEDEX) IV infusion 0.2 mcg/kg/hr (06/12/18 0600)  . DOPamine 3 mcg/kg/min (06/12/18 0600)  . famotidine (PEPCID) IV 100 mL/hr at 06/11/18 1600  . insulin 1.5 mL/hr at 06/12/18 0600  . lactated ringers    . lactated ringers    . lactated ringers 20 mL/hr at 06/12/18 0600  . nitroGLYCERIN Stopped (06/11/18 1711)  . phenylephrine (NEO-SYNEPHRINE) Adult infusion 10 mcg/min (06/12/18 0600)   PRN Meds:.sodium chloride, lactated ringers, metoprolol tartrate, midazolam, morphine injection, ondansetron (ZOFRAN) IV, oxyCODONE, sodium chloride flush, sodium chloride flush, traMADol  General appearance: alert, cooperative and no distress Heart: regular rate and rhythm Lungs: dim in bases Abdomen: soft, nontender Extremities: no LE edema Wound: dressings CDI  Lab Results: CBC: Recent Labs    06/11/18 2100 06/11/18 2122 06/12/18 0433  WBC 17.4*  --  18.1*  HGB 11.4* 10.9* 11.1*  HCT 33.9* 32.0* 33.0*  PLT 164  --  153   BMET:  Recent Labs    06/11/18 0525  06/11/18 2122 06/12/18 0433  NA 141   < > 142 141  K 3.5   < > 3.9 3.9  CL 110   < > 109 111  CO2 22  --   --  21*  GLUCOSE 124*   < > 127* 107*  BUN 8   < > 7* 8  CREATININE 0.71   < > 0.50* 0.68  CALCIUM 8.4*  --   --  7.5*   < > = values in this interval not displayed.      CMET: Lab Results  Component Value Date   WBC 18.1 (H) 06/12/2018   HGB 11.1 (L) 06/12/2018   HCT 33.0 (L) 06/12/2018   PLT 153 06/12/2018   GLUCOSE 107 (H) 06/12/2018   ALT 26 06/10/2018   AST 19 06/10/2018   NA 141 06/12/2018   K 3.9 06/12/2018   CL 111 06/12/2018   CREATININE 0.68 06/12/2018   BUN 8 06/12/2018   CO2 21 (L) 06/12/2018   TSH 0.997 06/05/2018   INR 1.59 06/11/2018   HGBA1C 5.9 (H) 04/07/2018      PT/INR:  Recent Labs    06/11/18 1522  LABPROT 18.8*  INR 1.59   Radiology: Dg Chest Port 1 View  Result Date: 06/12/2018 CLINICAL DATA:  Chest tube present s/p CABG x 4,,very sore chest EXAM: PORTABLE CHEST - 1 VIEW COMPARISON:  06/11/2018 FINDINGS: Patient has been extubated and the nasogastric tube removed. Left chest tube stable with no evident pneumothorax. Right IJ Swan-Ganz stable in the main pulmonary outflow tract. Mediastinal drain stable. Low lung volumes with patchy bibasilar atelectasis or infiltrate, left greater than right, marginally improved since previous exam. Heart size upper limits normal. Previous CABG. No effusion. Previous median sternotomy. Cervical fixation hardware noted. IMPRESSION: 1. Extubation with slight improvement in bibasilar atelectasis or infiltrates, left greater than right. 2. Remainder of support hardware stable in position. 3. No pneumothorax seen. Electronically Signed   By: Lucrezia Europe M.D.   On: 06/12/2018 08:02   Dg Chest Port 1 View  Result Date: 06/11/2018 CLINICAL DATA:  Postop CABG EXAM: PORTABLE CHEST 1 VIEW COMPARISON:  06/10/2018 FINDINGS: Endotracheal tube in good position. Swan-Ganz catheter main pulmonary artery. Left chest tube in place. Mediastinal drain in place. Negative for pneumothorax. No significant pleural effusion. Bibasilar atelectasis with hypoventilated lungs. Negative for edema. IMPRESSION: Hypoventilation with bibasilar atelectasis.  No pneumothorax. Electronically Signed   By: Franchot Gallo M.D.    On: 06/11/2018 16:12     Assessment/Plan: S/P Procedure(s) (LRB): CORONARY ARTERY BYPASS GRAFTING (CABG) x4 using left internal mammary artery and right greater saphenous vein harvested endoscopically. LIMA to distal LAD, Seq SVG to 1st & 2nd OM, SVG to distal PD (N/A) Pulmonary isolation MAZE using bipolar ablation (N/A) TRANSESOPHAGEAL ECHOCARDIOGRAM (TEE) (N/A) CLIPPING OF LEFT ATRIAL APPENDAGE using a 35 AtriCure Clip (N/A)  1 doing well overall, extubated without difficulty 2 hemodyn stable, apaced. Good CI/CO on low dose neo/dop- wean as able 3 renal fxn nl, minor volume overload, may need some  Diuretic 4 CT drainage fairly low, mediastinal tube to be removed 5 advance diet slowly, sugars ok, start levemir for now, home meds at d/c for DM 6 increased leukocytosis trens- SIR, monitor with temp curve 7 minor exp ABL anemia 8 ekg NS ST changes- monitor clinically 9 CXR reviewed- push pulm toilet/rehab as able 10 see progression orders   John Giovanni PA-C 06/12/2018 8:33 AM  Pager [phone] I have seen and examined Ronette Deter and agree with the above assessment  and plan.  Grace Isaac MD Beeper [phone] Office [phone] 06/12/2018 11:07 AM

## 2018-06-12 NOTE — Progress Notes (Signed)
CT surgery p.m. Rounds  Patient examined and record reviewed.Hemodynamics stable,labs satisfactory.Patient had stable day.Continue current care. Tharon Aquas Trigt III 06/12/2018

## 2018-06-13 ENCOUNTER — Inpatient Hospital Stay (HOSPITAL_COMMUNITY): Payer: HMO

## 2018-06-13 ENCOUNTER — Other Ambulatory Visit: Payer: Self-pay

## 2018-06-13 LAB — CBC
HCT: 30.1 % — ABNORMAL LOW (ref 39.0–52.0)
Hemoglobin: 9.9 g/dL — ABNORMAL LOW (ref 13.0–17.0)
MCH: 29.4 pg (ref 26.0–34.0)
MCHC: 32.9 g/dL (ref 30.0–36.0)
MCV: 89.3 fL (ref 80.0–100.0)
Platelets: 128 10*3/uL — ABNORMAL LOW (ref 150–400)
RBC: 3.37 MIL/uL — ABNORMAL LOW (ref 4.22–5.81)
RDW: 15.2 % (ref 11.5–15.5)
WBC: 17.5 10*3/uL — ABNORMAL HIGH (ref 4.0–10.5)
nRBC: 0 % (ref 0.0–0.2)

## 2018-06-13 LAB — BASIC METABOLIC PANEL
Anion gap: 5 (ref 5–15)
BUN: 15 mg/dL (ref 8–23)
CO2: 25 mmol/L (ref 22–32)
Calcium: 7.6 mg/dL — ABNORMAL LOW (ref 8.9–10.3)
Chloride: 105 mmol/L (ref 98–111)
Creatinine, Ser: 1.09 mg/dL (ref 0.61–1.24)
GFR calc Af Amer: >60 mL/min (ref >60)
GFR calc non Af Amer: >60 mL/min (ref >60)
Glucose, Bld: 106 mg/dL — ABNORMAL HIGH (ref 70–99)
Potassium: 3.9 mmol/L (ref 3.5–5.1)
Sodium: 135 mmol/L (ref 135–145)

## 2018-06-13 LAB — GLUCOSE, CAPILLARY
Glucose-Capillary: 117 mg/dL — ABNORMAL HIGH (ref 70–99)
Glucose-Capillary: 120 mg/dL — ABNORMAL HIGH (ref 70–99)
Glucose-Capillary: 123 mg/dL — ABNORMAL HIGH (ref 70–99)
Glucose-Capillary: 131 mg/dL — ABNORMAL HIGH (ref 70–99)
Glucose-Capillary: 141 mg/dL — ABNORMAL HIGH (ref 70–99)
Glucose-Capillary: 91 mg/dL (ref 70–99)

## 2018-06-13 MED ORDER — ASPIRIN 81 MG PO CHEW: 81.0000 mg | CHEWABLE_TABLET | Freq: Every day | ORAL | Status: DC

## 2018-06-13 MED ORDER — FUROSEMIDE 10 MG/ML IJ SOLN
40.0000 mg | Freq: Once | INTRAMUSCULAR | Status: AC
  Administered 2018-06-13: 40 mg via INTRAVENOUS
  Filled 2018-06-13: qty 4

## 2018-06-13 MED ORDER — CALCIUM CARBONATE ANTACID 500 MG PO CHEW
2.0000 | CHEWABLE_TABLET | Freq: Three times a day (TID) | ORAL | Status: DC | PRN
  Administered 2018-06-13: 400 mg via ORAL
  Filled 2018-06-13: qty 2

## 2018-06-13 MED ORDER — ASPIRIN EC 81 MG PO TBEC
81.0000 mg | DELAYED_RELEASE_TABLET | Freq: Every day | ORAL | Status: DC
  Administered 2018-06-13 – 2018-06-15 (×3): 81 mg via ORAL
  Filled 2018-06-13 (×3): qty 1

## 2018-06-13 MED FILL — Magnesium Sulfate Inj 50%: INTRAMUSCULAR | Qty: 10 | Status: AC

## 2018-06-13 MED FILL — Potassium Chloride Inj 2 mEq/ML: INTRAVENOUS | Qty: 40 | Status: AC

## 2018-06-13 MED FILL — Heparin Sodium (Porcine) Inj 1000 Unit/ML: INTRAMUSCULAR | Qty: 30 | Status: AC

## 2018-06-13 NOTE — Progress Notes (Signed)
A-fib noted in Bedside EKG. Pt without distress. Dr. Cyndia Bent notified, no new orders received and will continue to close monitor the patient.  Palma Holter, RN

## 2018-06-13 NOTE — Progress Notes (Signed)
Patient ID: Ryan Wagner, male   DOB: 29-Jul-1945, 73 y.o.   MRN: [id number] [facility] Evening Rounds:  Hemodynamically stable  V-paced at 88 Atrial fib under pacer. Amiodarone stopped this am due to bradycardia.

## 2018-06-13 NOTE — Discharge Instructions (Signed)
Coronary Artery Bypass Grafting, Care After This sheet gives you information about how to care for yourself after your procedure. Your doctor may also give you more specific instructions. If you have problems or questions, contact your doctor. Follow these instructions at home: Medicines  Take over-the-counter and prescription medicines only as told by your doctor. Do not stop taking medicines or start any new medicines unless your doctor says it is okay.  If you were prescribed an antibiotic medicine, take it as told by your doctor. Do not stop taking the antibiotic even if you start to feel better.  Do not drive or use heavy machinery while taking prescription pain medicine. Incision care      Follow instructions from your doctor about how to take care of your incisions. Make sure you: ? Wash your hands with soap and water before you change your bandage (dressing). If you cannot use soap and water, use hand sanitizer. ? Change your dressing as told by your doctor. ? Leave stitches (sutures), skin glue, or skin tape (adhesive) strips in place. They may need to stay in place for 2 weeks or longer. If tape strips get loose and curl up, you may trim the loose edges. Do not remove tape strips completely unless your doctor says it is okay.  Make sure the incisions are clean, dry, and protected.  Check your incision areas every day for signs of infection. Check for: ? Redness, swelling, or pain. ? Fluid or blood. ? Warmth. ? Pus or a bad smell.  If cuts were made in your legs: ? Avoid crossing your legs. ? Avoid sitting for long periods of time. Change positions every 30 minutes. ? Raise (elevate) your legs when you are sitting. Bathing  Do not take baths, swim, or use a hot tub until your doctor says it is okay.  You may shower, clean incisions gently with soap and water  Ask your doctor when you can shower. Eating and drinking  Eat foods that are high in fiber, such as raw  fruits and vegetables, whole grains, beans, and nuts. Meats should be lean cut. Avoid canned, processed, and fried foods. This can help prevent constipation. This is also a recommended part of a heart-healthy diet.  Drink enough fluid to keep your urine clear or pale yellow.  Limit alcohol intake to no more than 1 drink a day for nonpregnant women and 2 drinks a day for men. One drink equals 12 oz of beer, 5 oz of wine, or 1 oz of hard liquor. Activity  Rest and limit your activity as told by your doctor. You may be told to: ? Stop any activity right away if you have chest pain, shortness of breath, irregular heartbeats, or dizziness. Get help right away if you have any of these symptoms. ? Move around often for short periods or take short walks as told by your doctor. Slowly increase your activities. You may need physical therapy or cardiac rehabilitation. ? Avoid lifting, pushing, or pulling anything that is heavier than 10 lb (4.5 kg) for at least 6 weeks or as told by your doctor.  Do not drive until your doctor says it is okay.  Ask your doctor when you can go back to work.  Ask your doctor when you can be sexually active. General instructions   Do not use any products that contain nicotine or tobacco, such as cigarettes and e-cigarettes. If you need help quitting, ask your doctor.  Take 2-3 deep breaths every  few hours during the day while you get better. This helps expand your lungs and prevent complications.  If you were given a device called an incentive spirometer, use it several times a day to practice deep breathing. Support your chest with a pillow or your arms when you take deep breaths or cough.  Wear compression stockings as told by your doctor. These stockings help to prevent blood clots and reduce swelling in your legs.  Weigh yourself every day. This helps to see if your body is holding (retaining) fluid that may make your heart and lungs work harder.  Keep all  follow-up visits as told by your doctor. This is important. Contact a doctor if:  You have more redness, swelling, or pain around any cut.  You have more fluid or blood coming from any cut.  Any cut feels warm to the touch.  You have pus or a bad smell coming from any cut.  You have a fever.  You have swelling in your ankles or legs.  You have pain in your legs.  You gain 2 or more pounds (0.9 kg) a day.  You feel sick to your stomach (nauseous) or throw up (vomit).  You have watery poop (diarrhea). Get help right away if:  You have chest pain that goes to your jaw or arms.  You are short of breath.  You have a fast or irregular heartbeat.  You notice a "clicking" in your breastbone (sternum) when you move.  You feel numb or weak in your arms or legs.  You feel dizzy or light-headed. Summary  After the procedure, it is common to have pain or discomfort in the incision areas.  Do not take baths, swim, or use a hot tub until your health care provider approves.  Slowly increase your activities. You may need physical therapy or cardiac rehabilitation.  Weigh yourself every day. This helps to see if your body is holding (retaining) fluid that may make your heart and lungs work harder. This information is not intended to replace advice given to you by your health care provider. Make sure you discuss any questions you have with your health care provider. Document Released: 05/19/2013 Document Revised: 06/26/2016 Document Reviewed: 06/26/2016 Elsevier Interactive Patient Education  2019 [employer].

## 2018-06-13 NOTE — Discharge Summary (Signed)
Physician Discharge Summary  Patient ID: Ryan Wagner MRN: [id number] DOB/AGE: [birth year] 71 y.o.  Admit date: 06/10/2018 Discharge date: 06/19/2018  Admission Diagnoses: Severe multivessel coronary artery disease  Discharge Diagnoses:  Active Problems:   Abnormal ECG   Coronary artery disease   CAD (coronary artery disease), native coronary artery   S/P CABG x 4  History of Present Illness: At time of consultation  The patient is a 73 year old male who was recently seen by Dr. Claiborne Billings for cardiology evaluation.  He is a former patient of Dr. Chase Picket who last saw him in January 2010.  Dr. Claiborne Billings first saw the patient in February 2017.  The patient has a history of coronary artery disease having had an inferior wall myocardial infarction in 2004 at which time 3 drug-eluting stents were placed in the RCA.  At that time he was also noted to have moderate to severe disease in the obtuse marginal system.  A repeat cardiac catheterization by Dr. Rex Kras revealed diffuse disease in the circumflex vessel not amenable to percutaneous intervention and he was treated with medical therapy.  A perfusion study in 2007 showed normal perfusion in all myocardial segments.  He has other cardiovascular risk factors including diabetes mellitus type 2, hyperlipidemia and hypertension.  He was seen this past summer after he was found on his Apple Watch to have episodes of possible atrial fibrillation.  He had an event monitor placed on August 21 through September 19 and the primary rhythm was sinus with the fastest heart rate at 120 bpm.  He had rare PVCs and rare PACs.  He did have several short-lived episodes of PAF with an A. fib burden of less than 1%.  He underwent an echocardiogram Doppler study on October 28 2019th which showed normal systolic function with ejection fraction of 55 to 60%.  He did not have any regional wall motion abnormalities.  There was also noted diastolic grade 1 dysfunction and mild  dilation of his RV.  Over the past several months he stated that he felt well but his watch also detected episodes of atrial fibrillation in November and again in December.  He was seen recently by Dr. Claiborne Billings for reevaluation his most recent EKG showed some new T wave inversion in inferior and anterolateral leads which was not present in a EKG on September 2019.  Claiborne Billings felt these were worrisome for ischemia and cardiac catheterization was scheduled.  On today's date he was taken to the Cath Lab and was found to have severe multivessel disease.  Please see the full report listed below.  We are now asked to see the patient in cardiothoracic surgical consultation for consideration of multivessel CABG.  Most recent echocardiogram was done 03/24/2018 and the results are also listed below.  Of note the patient has not had any history of chest pain even at the time of his inferior myocardial infarction. The patient has had increasing fatigue with exercise. He quit going to the gym 8 months ago due to arm pain. He thought he needed shoulder surgery but ultimately had neck surgery.  Dr. [name] evaluated the patient and his studies and agree with recommendations to proceed with coronary artery surgical revascularization.    Discharged Condition: good  Hospital Course: The patient was admitted and underwent cardiac catheterization where he was found to have severe multivessel coronary artery disease.  Following consultation the patient was deemed to be an acceptable candidate to proceed with CABG.  On 06/11/2018 the patient was taken  to the operating room where he underwent the below described procedure.  He tolerated it well and was taken to the surgical intensive care unit in stable condition.  Postoperative hospital course:  The patient has overall progressed well.  He was extubated without difficulty using the standard postoperative protocols.  He has remained neurologically intact.  He has remained  hemodynamically stable.  He did have postoperative atrial fibrillation and was initially placed on amiodarone drip with transition to oral.  Additionally he was started on apixaban.  All routine lines, monitors and drainage devices were discontinued in the standard fashion.  The patient did have some mild postoperative volume overload which responded well to diuretics.  He did have some postoperative hypertension and was started on ACE inhibitor in addition to beta-blocker.  Blood sugars were well controlled and he will continue lifestyle and nutrition modifications at home.  He will restart his home metformin and Victoza.  Oxygen was weaned and he maintains good saturations on room air.  Incisions were noted to be healing well without evidence of infection.  At the time of discharge the patient was felt to be quite stable.  Consults: cardiology  Significant Diagnostic Studies: angiography: Cardiac catheterization  Treatments: surgery:  OPERATIVE REPORT  DATE OF PROCEDURE:  06/11/2018  PREOPERATIVE DIAGNOSIS:  Coronary occlusive disease and paroxysmal atrial fibrillation.  POSTOPERATIVE DIAGNOSIS:  Coronary occlusive disease and paroxysmal atrial fibrillation.  SURGICAL PROCEDURE:  Coronary artery bypass grafting x4 with the left internal mammary to the distal left anterior descending coronary artery, sequential reverse saphenous vein graft to the first and second obtuse marginal, reverse saphenous vein graft  to the distal posterior descending coronary artery, maze procedure with bilateral pulmonary vein isolation and placement of atrial clip, AtriCure, and right thigh and calf endoscopic harvesting of the right greater saphenous vein.  SURGEON:  Lanelle Bal, MD  FIRST ASSISTANT:  Jadene Pierini, [place] Discharge Exam: Blood pressure 139/85, pulse 83, temperature 98.2 F (36.8 C), temperature source Oral, resp. rate 18, height '5\' 7"'$  (1.702 m), weight 88 kg, SpO2 97 %.  General appearance:  alert, cooperative and no distress Heart: regular rate and rhythm Lungs: clear to auscultation bilaterally Abdomen: benign Extremities: right>Left lower ext edema Wound: incis healing well   Disposition: Discharge disposition: 01-Home or Self Care       Discharge Instructions    Amb Referral to Cardiac Rehabilitation   Complete by:  As directed    Diagnosis:  CABG   CABG X ___:  4   Discharge patient   Complete by:  As directed    Discharge disposition:  01-Home or Self Care   Discharge patient date:  06/17/2018     Allergies as of 06/17/2018   No Known Allergies     Medication List    STOP taking these medications   amLODipine-benazepril 10-20 MG capsule Commonly known as:  LOTREL   methocarbamol 500 MG tablet Commonly known as:  ROBAXIN     TAKE these medications   acetaminophen 325 MG tablet Commonly known as:  TYLENOL Take 2 tablets (650 mg total) by mouth every 6 (six) hours as needed for mild pain.   amiodarone 400 MG tablet Commonly known as:  PACERONE Take 1 tablet (400 mg total) by mouth 2 (two) times daily. For one week, then reduce to 400 mg once daily   apixaban 5 MG Tabs tablet Commonly known as:  ELIQUIS Take 1 tablet (5 mg total) by mouth 2 (two) times daily.  aspirin 81 MG EC tablet Take 1 tablet (81 mg total) by mouth daily.   atorvastatin 80 MG tablet Commonly known as:  LIPITOR Take 1 tablet (80 mg total) by mouth daily at 6 PM. What changed:    medication strength  how much to take  when to take this   Cinnamon 500 MG Tabs Take 500 mg by mouth daily.   Co Q 10 100 MG Caps Take 100 mg by mouth daily.   esomeprazole 20 MG capsule Commonly known as:  NEXIUM Take 20 mg by mouth daily.   Krill Oil 500 MG Caps Take 500 mg by mouth daily.   L-Arginine 1000 MG Tabs Take 1 tablet by mouth 2 (two) times daily. L-Arginine 1000 mg / L-Citrulline   lisinopril 5 MG tablet Commonly known as:  PRINIVIL,ZESTRIL Take 1 tablet (5 mg  total) by mouth daily.   MAGNESIUM GLUCONATE PO Take 150 mg by mouth daily.   metFORMIN 500 MG tablet Commonly known as:  GLUCOPHAGE Take 500 mg by mouth 2 (two) times daily with a meal.   metoprolol tartrate 25 MG tablet Commonly known as:  LOPRESSOR Take 1 tablet (25 mg total) by mouth 2 (two) times daily. What changed:    medication strength  how much to take   multivitamin tablet Take 1 tablet by mouth daily.   oxyCODONE 5 MG immediate release tablet Commonly known as:  Oxy IR/ROXICODONE Take 1-2 tablets (5-10 mg total) by mouth every 6 (six) hours as needed for up to 7 days for severe pain.   Turmeric 500 MG Caps Take 500 mg by mouth daily.   VICTOZA 18 MG/3ML Soln injection Generic drug:  Liraglutide Inject 1.2 mg into the skin daily.      Follow-up Information    Grace Isaac, MD Follow up.   Specialty:  Cardiothoracic Surgery Why:  Please see discharge report.  He is also obtain a chest x-ray at [facility] 1/2-hour prior to appointment with Dr. [name].  [facility] is located in the same office complex on the first floor.  Please also sign up for [facility] information: [place] [address] [phone]        Troy Sine, MD Follow up.   Specialty:  Cardiology Why:  Please see discharge paperwork for 2-week cardiology follow-up appointment Contact information: [address] [place] [address] [phone]          The patient has been discharged on:   1.Beta Blocker:  Yes [ y  ]                              No   [   ]                              If No, reason:  2.Ace Inhibitor/ARB: Yes [ y  ]                                     No  [    ]                                     If No, reason:  3.Statin:   Yes [ y  ]                  No  [   ]                  If No, reason:  4.Ecasa:  Yes  [ y  ]                  No   [   ]                  If No,  reason:  Signed: John Giovanni 06/19/2018, 9:06 AM

## 2018-06-13 NOTE — Progress Notes (Addendum)
[address]       [place] [address]             [phone]      2 Days Post-Op Procedure(s) (LRB): CORONARY ARTERY BYPASS GRAFTING (CABG) x4 using left internal mammary artery and right greater saphenous vein harvested endoscopically. LIMA to distal LAD, Seq SVG to 1st & 2nd OM, SVG to distal PD (N/A) Pulmonary isolation MAZE using bipolar ablation (N/A) TRANSESOPHAGEAL ECHOCARDIOGRAM (TEE) (N/A) CLIPPING OF LEFT ATRIAL APPENDAGE using a 35 AtriCure Clip (N/A) Subjective: Feels pretty well , mild discomfort from the chest incison/tube sinus brady in 40's under pacer  Objective: Vital signs in last 24 hours: Temp:  [98 F (36.7 C)-98.2 F (36.8 C)] 98 F (36.7 C) (01/17 0400) Pulse Rate:  [86-92] 87 (01/17 0700) Cardiac Rhythm: Atrial paced (01/17 0430) Resp:  [13-30] 20 (01/17 0700) BP: (81-117)/(55-75) 94/61 (01/17 0700) SpO2:  [93 %-99 %] 98 % (01/17 0700) Arterial Line BP: (94-162)/(42-80) 155/66 (01/16 1800) FiO2 (%):  [50 %] 50 % (01/16 2000) Weight:  [93 kg] 93 kg (01/17 0500)  Hemodynamic parameters for last 24 hours:    Intake/Output from previous day: 01/16 0701 - 01/17 0700 In: 1213.8 [I.V.:1040.6; IV Piggyback:173.2] Out: 825 [Urine:485; Chest Tube:340] Intake/Output this shift: No intake/output data recorded.  General appearance: alert, cooperative and no distress Heart: regular rate and rhythm Lungs: min dim in the bases Abdomen: benign Extremities: no edema Wound: dressings CDI  Lab Results: Recent Labs    06/12/18 2200 06/13/18 0445  WBC 16.9* 17.5*  HGB 10.0* 9.9*  HCT 30.8* 30.1*  PLT 130* 128*   BMET:  Recent Labs    06/12/18 0433 06/12/18 1641 06/12/18 2200 06/13/18 0445  NA 141 138  --  135  K 3.9 3.9  --  3.9  CL 111 103  --  105  CO2 21*  --   --  25  GLUCOSE 107* 156*  --  106*  BUN 8 12  --  15  CREATININE 0.68 0.90 1.08 1.09  CALCIUM 7.5*  --   --  7.6*    PT/INR:  Recent Labs     06/11/18 1522  LABPROT 18.8*  INR 1.59   ABG    Component Value Date/Time   PHART 7.369 06/11/2018 2003   HCO3 21.4 06/11/2018 2003   TCO2 21 (L) 06/12/2018 1641   ACIDBASEDEF 4.0 (H) 06/11/2018 2003   O2SAT 91.0 06/11/2018 2003   CBG (last 3)  Recent Labs    06/12/18 1957 06/12/18 2319 06/13/18 0437  GLUCAP 183* 146* 117*    Meds Scheduled Meds: . acetaminophen  1,000 mg Oral Q6H   Or  . acetaminophen (TYLENOL) oral liquid 160 mg/5 mL  1,000 mg Per Tube Q6H  . aspirin EC  325 mg Oral Daily   Or  . aspirin  324 mg Per Tube Daily  . atorvastatin  80 mg Oral q1800  . bisacodyl  10 mg Oral Daily   Or  . bisacodyl  10 mg Rectal Daily  . Chlorhexidine Gluconate Cloth  6 each Topical Daily  . docusate sodium  200 mg Oral Daily  . enoxaparin (LOVENOX) injection  30 mg Subcutaneous QHS  . insulin aspart  0-24 Units Subcutaneous Q4H  . insulin detemir  15 Units Subcutaneous Daily  . insulin regular  0-10 Units Intravenous TID WC  . metoprolol tartrate  12.5 mg Oral BID   Or  . metoprolol  tartrate  12.5 mg Per Tube BID  . pantoprazole  40 mg Oral Daily  . sodium chloride flush  10-40 mL Intracatheter Q12H  . sodium chloride flush  3 mL Intravenous Q12H   Continuous Infusions: . sodium chloride Stopped (06/12/18 0624)  . sodium chloride    . sodium chloride 10 mL (06/11/18 1525)  . amiodarone 30 mg/hr (06/13/18 0600)  . dexmedetomidine (PRECEDEX) IV infusion Stopped (06/12/18 2595)  . insulin Stopped (06/12/18 1502)  . lactated ringers    . lactated ringers Stopped (06/13/18 0548)  . lactated ringers 10 mL/hr at 06/13/18 0504  . nitroGLYCERIN Stopped (06/11/18 1711)  . phenylephrine (NEO-SYNEPHRINE) Adult infusion Stopped (06/12/18 1227)   PRN Meds:.sodium chloride, lactated ringers, metoprolol tartrate, midazolam, morphine injection, ondansetron (ZOFRAN) IV, oxyCODONE, sodium chloride flush, sodium chloride flush, traMADol  Xrays Dg Chest Port 1 View  Result  Date: 06/12/2018 CLINICAL DATA:  Chest tube present s/p CABG x 4,,very sore chest EXAM: PORTABLE CHEST - 1 VIEW COMPARISON:  06/11/2018 FINDINGS: Patient has been extubated and the nasogastric tube removed. Left chest tube stable with no evident pneumothorax. Right IJ Swan-Ganz stable in the main pulmonary outflow tract. Mediastinal drain stable. Low lung volumes with patchy bibasilar atelectasis or infiltrate, left greater than right, marginally improved since previous exam. Heart size upper limits normal. Previous CABG. No effusion. Previous median sternotomy. Cervical fixation hardware noted. IMPRESSION: 1. Extubation with slight improvement in bibasilar atelectasis or infiltrates, left greater than right. 2. Remainder of support hardware stable in position. 3. No pneumothorax seen. Electronically Signed   By: Lucrezia Europe M.D.   On: 06/12/2018 08:02   Dg Chest Port 1 View  Result Date: 06/11/2018 CLINICAL DATA:  Postop CABG EXAM: PORTABLE CHEST 1 VIEW COMPARISON:  06/10/2018 FINDINGS: Endotracheal tube in good position. Swan-Ganz catheter main pulmonary artery. Left chest tube in place. Mediastinal drain in place. Negative for pneumothorax. No significant pleural effusion. Bibasilar atelectasis with hypoventilated lungs. Negative for edema. IMPRESSION: Hypoventilation with bibasilar atelectasis.  No pneumothorax. Electronically Signed   By: Franchot Gallo M.D.   On: 06/11/2018 16:12    Assessment/Plan: S/P Procedure(s) (LRB): CORONARY ARTERY BYPASS GRAFTING (CABG) x4 using left internal mammary artery and right greater saphenous vein harvested endoscopically. LIMA to distal LAD, Seq SVG to 1st & 2nd OM, SVG to distal PD (N/A) Pulmonary isolation MAZE using bipolar ablation (N/A) TRANSESOPHAGEAL ECHOCARDIOGRAM (TEE) (N/A) CLIPPING OF LEFT ATRIAL APPENDAGE using a 35 AtriCure Clip (N/A)  1 doing well POD#2 2 a paced for bradycardia, on 1  Mcg of dopamine and amio gtt- may need to consider d/c amio  with bradycardia. D/C dopamine. BP runs relatively low, mostly in 90's. D/C metoprolol for now 3 chest tube , no air leak, 380 out yesterday and 150 so far today- keep for now 4 fair UOP,, renal fxn is normal, does not appear significantly volume overloaded currently but may need some diuretic as weight is up 5 increasing leukocytosis trend, likely reactive, no fevers, follow clinically 6 ABL anemia is stable 7 minor thrombocytopenia- follow clinically 8 BS adeq control, transition to home meds over next couple days 9 routine pulm toilet and rehab  LOS: 2 days    Ryan Giovanni PA-C 06/13/2018 Pager [phone]   With bradycardia will not continue Cordarone D/c dopamine  Hold lopressor with low heart rate  Mild diuresis Wait on restarting anticoagulation until closer to d/c and pacing wires are out  I have seen and examined Ryan Wagner  Sherre Lain and agree with the above assessment  and plan.  Grace Isaac MD Beeper [phone] Office [phone] 06/13/2018 9:00 AM

## 2018-06-14 ENCOUNTER — Inpatient Hospital Stay (HOSPITAL_COMMUNITY): Payer: HMO

## 2018-06-14 LAB — CBC
HCT: 30.7 % — ABNORMAL LOW (ref 39.0–52.0)
Hemoglobin: 10.2 g/dL — ABNORMAL LOW (ref 13.0–17.0)
MCH: 29.1 pg (ref 26.0–34.0)
MCHC: 33.2 g/dL (ref 30.0–36.0)
MCV: 87.7 fL (ref 80.0–100.0)
Platelets: 162 10*3/uL (ref 150–400)
RBC: 3.5 MIL/uL — ABNORMAL LOW (ref 4.22–5.81)
RDW: 15 % (ref 11.5–15.5)
WBC: 15.1 10*3/uL — ABNORMAL HIGH (ref 4.0–10.5)
nRBC: 0 % (ref 0.0–0.2)

## 2018-06-14 LAB — BASIC METABOLIC PANEL
Anion gap: 11 (ref 5–15)
BUN: 25 mg/dL — ABNORMAL HIGH (ref 8–23)
CO2: 22 mmol/L (ref 22–32)
Calcium: 8 mg/dL — ABNORMAL LOW (ref 8.9–10.3)
Chloride: 102 mmol/L (ref 98–111)
Creatinine, Ser: 1.24 mg/dL (ref 0.61–1.24)
GFR calc Af Amer: >60 mL/min (ref >60)
GFR calc non Af Amer: 59 mL/min — ABNORMAL LOW (ref >60)
Glucose, Bld: 94 mg/dL (ref 70–99)
Potassium: 3.7 mmol/L (ref 3.5–5.1)
Sodium: 135 mmol/L (ref 135–145)

## 2018-06-14 LAB — GLUCOSE, CAPILLARY
Glucose-Capillary: 75 mg/dL (ref 70–99)
Glucose-Capillary: 84 mg/dL (ref 70–99)
Glucose-Capillary: 85 mg/dL (ref 70–99)
Glucose-Capillary: 88 mg/dL (ref 70–99)
Glucose-Capillary: 89 mg/dL (ref 70–99)
Glucose-Capillary: 93 mg/dL (ref 70–99)

## 2018-06-14 MED ORDER — METOCLOPRAMIDE HCL 5 MG/ML IJ SOLN
10.0000 mg | Freq: Four times a day (QID) | INTRAMUSCULAR | Status: AC
  Administered 2018-06-14 – 2018-06-15 (×3): 10 mg via INTRAVENOUS
  Filled 2018-06-14 (×3): qty 2

## 2018-06-14 MED ORDER — FUROSEMIDE 10 MG/ML IJ SOLN
40.0000 mg | Freq: Once | INTRAMUSCULAR | Status: AC
  Administered 2018-06-14: 40 mg via INTRAVENOUS
  Filled 2018-06-14: qty 4

## 2018-06-14 MED ORDER — METOPROLOL TARTRATE 12.5 MG HALF TABLET
12.5000 mg | ORAL_TABLET | Freq: Two times a day (BID) | ORAL | Status: DC
  Administered 2018-06-14 (×2): 12.5 mg via ORAL
  Filled 2018-06-14 (×2): qty 1

## 2018-06-14 MED ORDER — TRAMADOL HCL 50 MG PO TABS
50.0000 mg | ORAL_TABLET | ORAL | Status: DC | PRN
  Administered 2018-06-14: 50 mg via ORAL
  Filled 2018-06-14: qty 1

## 2018-06-14 MED ORDER — METOLAZONE 2.5 MG PO TABS
2.5000 mg | ORAL_TABLET | Freq: Once | ORAL | Status: AC
  Administered 2018-06-14: 2.5 mg via ORAL
  Filled 2018-06-14: qty 1

## 2018-06-14 MED ORDER — POTASSIUM CHLORIDE CRYS ER 20 MEQ PO TBCR
40.0000 meq | EXTENDED_RELEASE_TABLET | Freq: Once | ORAL | Status: AC
  Administered 2018-06-14: 40 meq via ORAL
  Filled 2018-06-14: qty 2

## 2018-06-14 MED ORDER — ESOMEPRAZOLE MAGNESIUM 20 MG PO CPDR
20.0000 mg | DELAYED_RELEASE_CAPSULE | Freq: Every day | ORAL | Status: DC
  Administered 2018-06-14 – 2018-06-17 (×4): 20 mg via ORAL
  Filled 2018-06-14 (×4): qty 1

## 2018-06-14 MED ORDER — METOCLOPRAMIDE HCL 5 MG/ML IJ SOLN
10.0000 mg | Freq: Four times a day (QID) | INTRAMUSCULAR | Status: DC
  Administered 2018-06-14: 10 mg via INTRAVENOUS
  Filled 2018-06-14: qty 2

## 2018-06-14 MED ORDER — POTASSIUM CHLORIDE CRYS ER 20 MEQ PO TBCR
20.0000 meq | EXTENDED_RELEASE_TABLET | Freq: Once | ORAL | Status: AC
  Administered 2018-06-14: 20 meq via ORAL
  Filled 2018-06-14: qty 1

## 2018-06-14 NOTE — Progress Notes (Signed)
Patient ID: Ryan Wagner, male   DOB: Feb 27, 1946, 73 y.o.   MRN: [id number] [facility] Evening Rounds:   Hemodynamically stable in sinus rhythm 80's.  Diuresed well today. Replacing K+

## 2018-06-14 NOTE — Progress Notes (Signed)
3 Days Post-Op Procedure(s) (LRB): CORONARY ARTERY BYPASS GRAFTING (CABG) x4 using left internal mammary artery and right greater saphenous vein harvested endoscopically. LIMA to distal LAD, Seq SVG to 1st & 2nd OM, SVG to distal PD (N/A) Pulmonary isolation MAZE using bipolar ablation (N/A) TRANSESOPHAGEAL ECHOCARDIOGRAM (TEE) (N/A) CLIPPING OF LEFT ATRIAL APPENDAGE using a 35 AtriCure Clip (N/A) Subjective: No complaints. Had some nausea earlier today but better now after Reglan. Passing flatus but no BM yet.  Ambulating well  Objective: Vital signs in last 24 hours: Temp:  [97.6 F (36.4 C)-98.5 F (36.9 C)] 97.9 F (36.6 C) (01/18 1122) Pulse Rate:  [66-88] 68 (01/18 1200) Cardiac Rhythm: Normal sinus rhythm (01/18 1200) Resp:  [11-20] 18 (01/18 1200) BP: (96-155)/(62-84) 120/72 (01/18 1200) SpO2:  [93 %-95 %] 95 % (01/18 1200) Weight:  [94.1 kg] 94.1 kg (01/18 0330)  Hemodynamic parameters for last 24 hours:    Intake/Output from previous day: 01/17 0701 - 01/18 0700 In: 309.5 [P.O.:240; I.V.:69.5] Out: 165 [Urine:145; Chest Tube:20] Intake/Output this shift: Total I/O In: 160 [P.O.:160] Out: 200 [Urine:200]  General appearance: alert and cooperative Neurologic: intact Heart: regular rate and rhythm, S1, S2 normal, no murmur, click, rub or gallop Lungs: clear to auscultation bilaterally Extremities: edema mild Wound: incisions ok  Lab Results: Recent Labs    06/13/18 0445 06/14/18 0642  WBC 17.5* 15.1*  HGB 9.9* 10.2*  HCT 30.1* 30.7*  PLT 128* 162   BMET:  Recent Labs    06/13/18 0445 06/14/18 0642  NA 135 135  K 3.9 3.7  CL 105 102  CO2 25 22  GLUCOSE 106* 94  BUN 15 25*  CREATININE 1.09 1.24  CALCIUM 7.6* 8.0*    PT/INR:  Recent Labs    06/11/18 1522  LABPROT 18.8*  INR 1.59   ABG    Component Value Date/Time   PHART 7.369 06/11/2018 2003   HCO3 21.4 06/11/2018 2003   TCO2 21 (L) 06/12/2018 1641   ACIDBASEDEF 4.0 (H) 06/11/2018  2003   O2SAT 91.0 06/11/2018 2003   CBG (last 3)  Recent Labs    06/14/18 0355 06/14/18 0734 06/14/18 1123  GLUCAP 89 85 88   CLINICAL DATA:  Chest tube in place  EXAM: PORTABLE CHEST 1 VIEW  COMPARISON:  06/13/2018  FINDINGS: Left chest tube removed. Right jugular sheath removed. No pneumothorax.  Mild bibasilar atelectasis with slight improvement. No edema. Small bilateral effusions.  IMPRESSION: No pneumothorax post left chest tube removal.  Mild improvement in bibasilar atelectasis.   Electronically Signed   By: Franchot Gallo M.D.   On: 06/14/2018 07:49  Assessment/Plan: S/P Procedure(s) (LRB): CORONARY ARTERY BYPASS GRAFTING (CABG) x4 using left internal mammary artery and right greater saphenous vein harvested endoscopically. LIMA to distal LAD, Seq SVG to 1st & 2nd OM, SVG to distal PD (N/A) Pulmonary isolation MAZE using bipolar ablation (N/A) TRANSESOPHAGEAL ECHOCARDIOGRAM (TEE) (N/A) CLIPPING OF LEFT ATRIAL APPENDAGE using a 35 AtriCure Clip (N/A)  POD 3  Hemodynamically stable in sinus rhythm 70's. Still having some atrial fib but rate controlled in the 70's. Did not tolerate amiodarone due to bradycardia so will start low dose Lopressor. Plan to resume Eliquis at discharge.  Volume excess: 17 lbs over preop wt if accurate. Will start diuresing and replace K+  DM: glucose under good control on Levemir and SSI.  Continue IS, ambulation   LOS: 3 days    Gaye Pollack 06/14/2018

## 2018-06-15 LAB — BASIC METABOLIC PANEL
Anion gap: 10 (ref 5–15)
BUN: 23 mg/dL (ref 8–23)
CO2: 23 mmol/L (ref 22–32)
Calcium: 7.9 mg/dL — ABNORMAL LOW (ref 8.9–10.3)
Chloride: 103 mmol/L (ref 98–111)
Creatinine, Ser: 1.16 mg/dL (ref 0.61–1.24)
GFR calc Af Amer: >60 mL/min (ref >60)
GFR calc non Af Amer: >60 mL/min (ref >60)
Glucose, Bld: 91 mg/dL (ref 70–99)
Potassium: 4.1 mmol/L (ref 3.5–5.1)
Sodium: 136 mmol/L (ref 135–145)

## 2018-06-15 LAB — GLUCOSE, CAPILLARY
Glucose-Capillary: 80 mg/dL (ref 70–99)
Glucose-Capillary: 78 mg/dL (ref 70–99)
Glucose-Capillary: 108 mg/dL — ABNORMAL HIGH (ref 70–99)
Glucose-Capillary: 208 mg/dL — ABNORMAL HIGH (ref 70–99)
Glucose-Capillary: 84 mg/dL (ref 70–99)

## 2018-06-15 MED ORDER — DOXYLAMINE SUCCINATE (SLEEP) 25 MG PO TABS: 25.0000 mg | ORAL_TABLET | Freq: Every evening | ORAL | Status: DC | PRN

## 2018-06-15 MED ORDER — INSULIN ASPART 100 UNIT/ML ~~LOC~~ SOLN
0.0000 [IU] | Freq: Three times a day (TID) | SUBCUTANEOUS | Status: DC
  Administered 2018-06-15: 8 [IU] via SUBCUTANEOUS
  Administered 2018-06-16 (×2): 2 [IU] via SUBCUTANEOUS

## 2018-06-15 MED ORDER — ACETAMINOPHEN 325 MG PO TABS: 650.0000 mg | ORAL_TABLET | Freq: Four times a day (QID) | ORAL | Status: DC | PRN

## 2018-06-15 MED ORDER — SODIUM CHLORIDE 0.9% FLUSH
3.0000 mL | Freq: Two times a day (BID) | INTRAVENOUS | Status: DC
  Administered 2018-06-15 – 2018-06-16 (×3): 3 mL via INTRAVENOUS

## 2018-06-15 MED ORDER — ONDANSETRON HCL 4 MG PO TABS: 4.0000 mg | ORAL_TABLET | Freq: Four times a day (QID) | ORAL | Status: DC | PRN

## 2018-06-15 MED ORDER — TRAMADOL HCL 50 MG PO TABS: 50.0000 mg | ORAL_TABLET | ORAL | Status: DC | PRN

## 2018-06-15 MED ORDER — INSULIN DETEMIR 100 UNIT/ML ~~LOC~~ SOLN
10.0000 [IU] | Freq: Every day | SUBCUTANEOUS | Status: DC
  Administered 2018-06-15 – 2018-06-16 (×2): 10 [IU] via SUBCUTANEOUS
  Filled 2018-06-15 (×3): qty 0.1

## 2018-06-15 MED ORDER — INSULIN ASPART 100 UNIT/ML ~~LOC~~ SOLN: 0.0000 [IU] | Freq: Three times a day (TID) | SUBCUTANEOUS | Status: DC

## 2018-06-15 MED ORDER — MOVING RIGHT ALONG BOOK
Freq: Once | Status: AC
  Administered 2018-06-15: 18:00:00
  Filled 2018-06-15: qty 1

## 2018-06-15 MED ORDER — SODIUM CHLORIDE 0.9% FLUSH: 3.0000 mL | INTRAVENOUS | Status: DC | PRN

## 2018-06-15 MED ORDER — METOPROLOL TARTRATE 25 MG PO TABS
25.0000 mg | ORAL_TABLET | Freq: Two times a day (BID) | ORAL | Status: DC
  Administered 2018-06-15 – 2018-06-17 (×5): 25 mg via ORAL
  Filled 2018-06-15 (×5): qty 1

## 2018-06-15 MED ORDER — MELATONIN 3 MG PO TABS
3.0000 mg | ORAL_TABLET | Freq: Every day | ORAL | Status: DC
  Administered 2018-06-15 – 2018-06-16 (×2): 3 mg via ORAL
  Filled 2018-06-15 (×2): qty 1

## 2018-06-15 MED ORDER — LACTULOSE 10 GM/15ML PO SOLN
10.0000 g | Freq: Once | ORAL | Status: AC
  Administered 2018-06-15: 10 g via ORAL
  Filled 2018-06-15: qty 15

## 2018-06-15 MED ORDER — POTASSIUM CHLORIDE CRYS ER 20 MEQ PO TBCR
20.0000 meq | EXTENDED_RELEASE_TABLET | Freq: Two times a day (BID) | ORAL | Status: DC
  Administered 2018-06-15 – 2018-06-17 (×5): 20 meq via ORAL
  Filled 2018-06-15 (×5): qty 1

## 2018-06-15 MED ORDER — ONDANSETRON HCL 4 MG/2ML IJ SOLN: 4.0000 mg | Freq: Four times a day (QID) | INTRAMUSCULAR | Status: DC | PRN

## 2018-06-15 MED ORDER — OXYCODONE HCL 5 MG PO TABS: 5.0000 mg | ORAL_TABLET | ORAL | Status: DC | PRN

## 2018-06-15 MED ORDER — ASPIRIN EC 325 MG PO TBEC
325.0000 mg | DELAYED_RELEASE_TABLET | Freq: Every day | ORAL | Status: DC
  Administered 2018-06-16: 325 mg via ORAL
  Filled 2018-06-15: qty 1

## 2018-06-15 MED ORDER — FUROSEMIDE 40 MG PO TABS
40.0000 mg | ORAL_TABLET | Freq: Every day | ORAL | Status: AC
  Administered 2018-06-15 – 2018-06-17 (×3): 40 mg via ORAL
  Filled 2018-06-15 (×3): qty 1

## 2018-06-15 MED ORDER — SODIUM CHLORIDE 0.9 % IV SOLN: 250.0000 mL | INTRAVENOUS | Status: DC | PRN

## 2018-06-15 NOTE — Progress Notes (Signed)
Pt tx from [place] via wheelchair. Pt ambulates well. Vitals stable. CCMD notified. Telebox 13 applied. CHG bath given. Will continue to monitor. Ryan Wagner

## 2018-06-15 NOTE — Progress Notes (Signed)
4 Days Post-Op Procedure(s) (LRB): CORONARY ARTERY BYPASS GRAFTING (CABG) x4 using left internal mammary artery and right greater saphenous vein harvested endoscopically. LIMA to distal LAD, Seq SVG to 1st & 2nd OM, SVG to distal PD (N/A) Pulmonary isolation MAZE using bipolar ablation (N/A) TRANSESOPHAGEAL ECHOCARDIOGRAM (TEE) (N/A) CLIPPING OF LEFT ATRIAL APPENDAGE using a 35 AtriCure Clip (N/A) Subjective: No complaints other than not sleeping that well. Takes Unisom at home. No BM in 6 days.  Has been maintaining sinus with no AF on Lopressor 12.5.  Objective: Vital signs in last 24 hours: Temp:  [97.7 F (36.5 C)-98.4 F (36.9 C)] 98.3 F (36.8 C) (01/19 0748) Pulse Rate:  [68-105] 77 (01/19 0800) Cardiac Rhythm: Normal sinus rhythm (01/19 0800) Resp:  [15-34] 16 (01/19 0800) BP: (107-140)/(57-95) 124/77 (01/19 0800) SpO2:  [88 %-96 %] 94 % (01/19 0800) Weight:  [91.6 kg] 91.6 kg (01/19 0500)  Hemodynamic parameters for last 24 hours:    Intake/Output from previous day: 01/18 0701 - 01/19 0700 In: 520 [P.O.:520] Out: 2170 [Urine:2170] Intake/Output this shift: No intake/output data recorded.  General appearance: alert and cooperative Neurologic: intact Heart: regular rate and rhythm, S1, S2 normal, no murmur, click, rub or gallop Lungs: clear to auscultation bilaterally Extremities: edema mild Wound: incisions ok  Lab Results: Recent Labs    06/13/18 0445 06/14/18 0642  WBC 17.5* 15.1*  HGB 9.9* 10.2*  HCT 30.1* 30.7*  PLT 128* 162   BMET:  Recent Labs    06/14/18 0642 06/15/18 0319  NA 135 136  K 3.7 4.1  CL 102 103  CO2 22 23  GLUCOSE 94 91  BUN 25* 23  CREATININE 1.24 1.16  CALCIUM 8.0* 7.9*    PT/INR: No results for input(s): LABPROT, INR in the last 72 hours. ABG    Component Value Date/Time   PHART 7.369 06/11/2018 2003   HCO3 21.4 06/11/2018 2003   TCO2 21 (L) 06/12/2018 1641   ACIDBASEDEF 4.0 (H) 06/11/2018 2003   O2SAT 91.0  06/11/2018 2003   CBG (last 3)  Recent Labs    06/14/18 2352 06/15/18 0354 06/15/18 0751  GLUCAP 84 80 78    Assessment/Plan: S/P Procedure(s) (LRB): CORONARY ARTERY BYPASS GRAFTING (CABG) x4 using left internal mammary artery and right greater saphenous vein harvested endoscopically. LIMA to distal LAD, Seq SVG to 1st & 2nd OM, SVG to distal PD (N/A) Pulmonary isolation MAZE using bipolar ablation (N/A) TRANSESOPHAGEAL ECHOCARDIOGRAM (TEE) (N/A) CLIPPING OF LEFT ATRIAL APPENDAGE using a 35 AtriCure Clip (N/A)  POD 4  Hemodynamically stable in sinus rhythm. HR 90's so will increase Lopressor to 25 bid.  DM: glucose under good control. Will decrease Levemir to 10 units and SSI to TID. Plan to resume previous meds at discharge.  Volume excess: Wt is probably only 2-3 lbs over preop. Will continue oral lasix for a few more days.  Atrial fib: maintaining sinus on Lopressor. Plan to resume Eliquis at discharge.  Laxative this am.  Transfer to 4E.   LOS: 4 days    Gaye Pollack 06/15/2018

## 2018-06-16 LAB — GLUCOSE, CAPILLARY
Glucose-Capillary: 105 mg/dL — ABNORMAL HIGH (ref 70–99)
Glucose-Capillary: 132 mg/dL — ABNORMAL HIGH (ref 70–99)
Glucose-Capillary: 132 mg/dL — ABNORMAL HIGH (ref 70–99)
Glucose-Capillary: 147 mg/dL — ABNORMAL HIGH (ref 70–99)

## 2018-06-16 MED ORDER — ASPIRIN EC 81 MG PO TBEC
81.0000 mg | DELAYED_RELEASE_TABLET | Freq: Every day | ORAL | Status: DC
  Administered 2018-06-17: 81 mg via ORAL
  Filled 2018-06-16: qty 1

## 2018-06-16 MED ORDER — AMIODARONE HCL 200 MG PO TABS
400.0000 mg | ORAL_TABLET | Freq: Two times a day (BID) | ORAL | Status: DC
  Administered 2018-06-16 – 2018-06-17 (×3): 400 mg via ORAL
  Filled 2018-06-16 (×3): qty 2

## 2018-06-16 NOTE — Progress Notes (Signed)
Patient ambulated several times in the hall on this shift , ambulation well tolerated

## 2018-06-16 NOTE — Progress Notes (Signed)
Pt went into afib @0622  100-110's will continue to monitor

## 2018-06-16 NOTE — Progress Notes (Signed)
Patient back in afib, rate controlled, patient asymptomatic.

## 2018-06-16 NOTE — Progress Notes (Signed)
CARDIAC REHAB PHASE I   PRE:  Rate/Rhythm: 100 Afib  BP:  Sitting: 133/75      SaO2: 98 RA  MODE:  Ambulation: 470 ft    130 peak HR  POST:  Rate/Rhythm: 106 Afib  BP:  Sitting: 126/87    SaO2: 99 RA   Pt ambulated 426ft in hallway independently with slow steady gait. Pt denies SOB, states he can feel "something is off" when his HR gets up. Pt states he wears an Apple Watch at home that monitors his heart rate. Pt to get his EPW d/c'd today. Will follow-up for d/c plans.  [phone] Rufina Falco, RN BSN 06/16/2018 8:29 AM

## 2018-06-16 NOTE — Care Management Important Message (Signed)
Important Message  Patient Details  Name: Ryan Wagner MRN: [id number] Date of Birth: 29-Apr-1946   Medicare Important Message Given:  Yes    [name] 06/16/2018, 2:29 PM

## 2018-06-16 NOTE — Progress Notes (Signed)
Patient education completed for removal of pacing wires, he verbalized understanding, pacing wires removed as ordered, procedure well tolerated, patient back in sinus rhythm  patient now on bedrest call bell within reach will continue to monitor.

## 2018-06-16 NOTE — Progress Notes (Addendum)
      [address]       [place] [address]             [phone]        5 Days Post-Op Procedure(s) (LRB): CORONARY ARTERY BYPASS GRAFTING (CABG) x4 using left internal mammary artery and right greater saphenous vein harvested endoscopically. LIMA to distal LAD, Seq SVG to 1st & 2nd OM, SVG to distal PD (N/A) Pulmonary isolation MAZE using bipolar ablation (N/A) TRANSESOPHAGEAL ECHOCARDIOGRAM (TEE) (N/A) CLIPPING OF LEFT ATRIAL APPENDAGE using a 35 AtriCure Clip (N/A)  Subjective: Patient with loose stools since last evening.  Objective: Vital signs in last 24 hours: Temp:  [97.6 F (36.4 C)-98.5 F (36.9 C)] 97.6 F (36.4 C) (01/20 0300) Pulse Rate:  [71-96] 80 (01/20 0300) Cardiac Rhythm: Atrial fibrillation (01/20 0700) Resp:  [14-23] 23 (01/20 0635) BP: (120-132)/(68-87) 126/75 (01/20 0300) SpO2:  [81 %-96 %] 96 % (01/20 0300) Weight:  [89.6 kg] 89.6 kg (01/20 0300)  Pre op weight 86.2 kg Current Weight  06/16/18 89.6 kg   Intake/Output from previous day: 01/19 0701 - 01/20 0700 In: 360 [P.O.:360] Out: 500 [Urine:500]   Physical Exam:  Cardiovascular: IRRR IRRR Pulmonary: Slightly diminished at bases Abdomen: Soft, non tender, bowel sounds present. Extremities: Mild bilateral lower extremity edema. Wounds: Clean and dry.  No erythema or signs of infection.  Lab Results: CBC: Recent Labs    06/14/18 0642  WBC 15.1*  HGB 10.2*  HCT 30.7*  PLT 162   BMET:  Recent Labs    06/14/18 0642 06/15/18 0319  NA 135 136  K 3.7 4.1  CL 102 103  CO2 22 23  GLUCOSE 94 91  BUN 25* 23  CREATININE 1.24 1.16  CALCIUM 8.0* 7.9*    PT/INR:  Lab Results  Component Value Date   INR 1.59 06/11/2018   INR 1.12 06/10/2018   INR 1.06 05/19/2014   ABG:  INR: Will add last result for INR, ABG once components are confirmed Will add last 4 CBG results once components are confirmed  Assessment/Plan:  1. CV - He went into a fib with HR up  into 110's earlier this am (s/p MAZE as has a history of a fib). On Lopressor 25 mg bid. Will put on oral Amiodarone as HR in the 90's in a fib this am. Will restart Apixaban in am 2.  Pulmonary - On room air. Encourage incentive spirometer 3. Volume Overload - On Lasix 40 mg daily 4.  Acute blood loss anemia - Last H and H 10.2 and 30.7 5. DM-CBGs 208/84/105. On Insulin. Pre op HGA1C 5.9 in Nov 2019 6. Will remove EPW (as a fib rate not high, so doubt will rapid A pace) 7. GI-Stool softeners already stopped  Donielle M ZimmermanPA-C 06/16/2018,7:41 AM [phone]   Now back in sinus rhythm  Stable day , back home on eliquis  I have seen and examined Ryan Wagner and agree with the above assessment  and plan.  Grace Isaac MD Beeper [phone] Office [phone] 06/16/2018 5:25 PM

## 2018-06-16 NOTE — Op Note (Signed)
NAME: Ryan Wagner, Ryan Wagner MEDICAL RECORD PY:[id number] ACCOUNT [id number] DATE OF BIRTH:[birth year] FACILITY: [facility] LOCATION: [facility] PHYSICIAN:Yaneisy Wenz Maryruth Bun, MD  OPERATIVE REPORT  DATE OF PROCEDURE:  06/11/2018  PREOPERATIVE DIAGNOSIS:  Coronary occlusive disease and paroxysmal atrial fibrillation.  POSTOPERATIVE DIAGNOSIS:  Coronary occlusive disease and paroxysmal atrial fibrillation.  SURGICAL PROCEDURE:  Coronary artery bypass grafting x4 with the left internal mammary to the distal left anterior descending coronary artery, sequential reverse saphenous vein graft to the first and second obtuse marginal, reverse saphenous vein graft  to the distal posterior descending coronary artery, maze procedure with bilateral pulmonary vein isolation and placement of atrial clip, AtriCure, and right thigh and calf endoscopic harvesting of the right greater saphenous vein.  SURGEON:  Lanelle Bal, MD  FIRST ASSISTANT:  Jadene Pierini, PA  BRIEF HISTORY:  The patient is a 73 year old male with known coronary occlusive disease, having had extensive stents placed into the distal right coronary artery and proximal posterior descending 12 years previously.  The patient underwent cardiac  catheterization by Dr. Saunders Revel the day prior to surgery, which demonstrated diffuse right coronary artery disease and stenosis within the stented portion of the distal right posterior descending.  He had extensive disease throughout the LAD diffusely,  relatively poor distal vessel.  The circumflex was a very large vessel with proximal 70% stenosis and 80% to 90% stenosis of the first and second obtuse marginal, which was the primary supplier of the lateral wall.  Overall, ventricular function was  preserved.  The patient is known to be diabetic but has been well controlled.  Because of the diffuse nature of his disease, coronary artery bypass grafting was recommended to the patient.  Risks and options were discussed in  detail, and he was agreeable  and signed informed consent.  In addition, the patient has had very intermittent paroxysmal atrial fibrillation, never more than continuous period for more than 1 day.  This was first noted in the summer of 2019.  The patient had been started on Eliquis  at that time.  DESCRIPTION OF PROCEDURE:  With Swan-Ganz and arterial line monitors in place, the patient underwent general endotracheal anesthesia.  Care was taken with his intubation as the month previous had neck surgery for cervical neck disease causing bilateral  arm pain.  The skin of the chest and legs was prepped with Betadine and draped in the usual sterile manner.  TEE probe was placed and showed intact LV function and without obvious valvular disease.  There was no clot in the atrial appendage.  The patient  was in sinus rhythm at this time.  The skin of the chest and legs was prepped with Betadine and draped in the usual sterile manner.  Appropriate timeout was performed, and we proceeded with right greater saphenous endoscopic vein harvesting.  The vein  was of good quality and caliber.  Median sternotomy was performed.  The left internal mammary artery was dissected down as a pedicle graft.  The distal artery was divided and had good free flow.  The pericardium was opened.  The patient was systemically  heparinized.  The ascending aorta was cannulated.  The right atrium was cannulated with a dual-stage venous cannula.  The patient was then placed on cardiopulmonary bypass, 2.4 L/min/sq m.  Careful dissection around the right and left pulmonary veins was  done and red rubber catheters were placed.  The atrial appendage was relatively small in size for a 35 mm AtriCure clip.  An aortic root vent cardioplegia  needle was introduced into the ascending aorta.  The patient's body temperature was cooled to 32  degrees.  Aortic crossclamp was applied; 500 mL of cold blood potassium cardioplegia was administered antegrade.   Myocardial septal temperature was monitored throughout the crossclamp.  We turned our attention first to the maze procedure, performing  bipolar ablations at the base of the left atrial appendage, 2 ablations.  Attention was then turned to the left pulmonary veins where a total of 6 ablations with resetting the device 3 times.  In a similar fashion, the right pulmonary veins were ablated,  a total of 6 ablations.  We then placed a 35 mm AtriCure clip on the base of the left atrial appendage.  We then turned our attention to the coronary disease.  The very distal right coronary artery was first opened.  The stents in the posterior  descending were easily palpable, and in the distal half of the posterior descending, a soft area was able to be opened.  A 1 mm probe passed distally.  Using a running 8-0 Prolene, a segment of reverse saphenous vein graft was anastomosed to the distal  third of the posterior descending.  The vessel was very diffusely diseased.  The heart was then elevated, and the first and second obtuse marginal vessels were identified.  The first obtuse marginal vessel was opened and admitted a 1.5 mm probe distally.   Using a segment of reverse saphenous vein graft, a side-to-side anastomosis was carried out with a running 8-0 Prolene.  The distal extent of the same vein then was trimmed to the appropriate length.  The second obtuse marginal was opened and was 1.2  to 1.3 mm in size.  Using a running 8-0 Prolene, distal anastomosis was performed.  Additional cold blood cardioplegia was administered down the vein grafts.  Attention was then turned to the left anterior descending coronary artery.  The LAD was very  diffusely diseased and highly calcified throughout its course.  The distal third of the LAD was relatively soft and was opened.  A 1 mm probe passed distally.  Using a running 8-0 Prolene, the left internal mammary artery was anastomosed to the left  anterior descending coronary  artery.  With crossclamp still in place, 2 punch aortotomies were performed, and each of the 2 vein grafts were anastomosed to the ascending aorta.  The bulldog was removed from the mammary artery with prompt rise in  myocardial septal temperature.  The heart was allowed to passively fill and deair, and the proximal anastomoses were completed.  Aortic crossclamp was then removed.  The patient did require electrical defibrillation to return to a sinus rhythm.  He was  atrially paced to increase his rate.  Sites of anastomosis were inspected and were free of bleeding.  With the patient's body temperature rewarmed to 37 degrees, he was then ventilated and weaned from cardiopulmonary bypass.  He remained hemodynamically  stable, atrially paced on low-dose dopamine.  TEE showed good overall function of the left and right heart.  The patient was decannulated in the usual fashion.  Protamine sulfate was administered.  With operative field hemostatic, atrial and ventricular  pacing wires had been applied.  Graft markers were applied.  A left pleural tube and Blake mediastinal drain were left in place.  The pericardium was loosely reapproximated.  The sternum was then closed with #6 stainless steel wire, fascia closed with  interrupted 0 Vicryl, running 3-0 Vicryl subcutaneous tissue, 4-0 subcuticular stitch in skin  edges.  Dry dressings were applied.  Sponge and needle count was reported as correct at completion of the procedure.  RF scanning was done and reported as  clear.  The patient did not require any blood bank blood products during the operative procedure.  Total crossclamp time was 129 minutes.  Total pump time 177 minutes.  LN/NUANCE  D:06/15/2018 T:06/16/2018 JOB:[id number]

## 2018-06-17 LAB — GLUCOSE, CAPILLARY: Glucose-Capillary: 115 mg/dL — ABNORMAL HIGH (ref 70–99)

## 2018-06-17 MED ORDER — ASPIRIN 81 MG PO TBEC: 81.0000 mg | DELAYED_RELEASE_TABLET | Freq: Every day | ORAL | Status: DC

## 2018-06-17 MED ORDER — APIXABAN 5 MG PO TABS
5.0000 mg | ORAL_TABLET | Freq: Two times a day (BID) | ORAL | Status: DC
  Administered 2018-06-17: 5 mg via ORAL
  Filled 2018-06-17: qty 1

## 2018-06-17 MED ORDER — OXYCODONE HCL 5 MG PO TABS: 5.0000 mg | ORAL_TABLET | Freq: Four times a day (QID) | ORAL | 0 refills | Status: AC | PRN

## 2018-06-17 MED ORDER — ACETAMINOPHEN 325 MG PO TABS: 650.0000 mg | ORAL_TABLET | Freq: Four times a day (QID) | ORAL | Status: DC | PRN

## 2018-06-17 MED ORDER — METOPROLOL TARTRATE 25 MG PO TABS: 25.0000 mg | ORAL_TABLET | Freq: Two times a day (BID) | ORAL | 1 refills | Status: DC

## 2018-06-17 MED ORDER — AMIODARONE HCL 400 MG PO TABS: 400.0000 mg | ORAL_TABLET | Freq: Two times a day (BID) | ORAL | 1 refills | Status: DC

## 2018-06-17 MED ORDER — LISINOPRIL 5 MG PO TABS: 5.0000 mg | ORAL_TABLET | Freq: Every day | ORAL | 1 refills | Status: DC

## 2018-06-17 MED ORDER — LISINOPRIL 5 MG PO TABS
5.0000 mg | ORAL_TABLET | Freq: Every day | ORAL | Status: DC
  Administered 2018-06-17: 5 mg via ORAL
  Filled 2018-06-17: qty 1

## 2018-06-17 MED ORDER — ATORVASTATIN CALCIUM 80 MG PO TABS: 80.0000 mg | ORAL_TABLET | Freq: Every day | ORAL | 1 refills | Status: DC

## 2018-06-17 MED FILL — Lidocaine HCl(Cardiac) IV PF Soln Pref Syr 100 MG/5ML (2%): INTRAVENOUS | Qty: 5 | Status: AC

## 2018-06-17 MED FILL — Sodium Chloride IV Soln 0.9%: INTRAVENOUS | Qty: 2000 | Status: AC

## 2018-06-17 MED FILL — Heparin Sodium (Porcine) Inj 1000 Unit/ML: INTRAMUSCULAR | Qty: 10 | Status: AC

## 2018-06-17 MED FILL — Mannitol IV Soln 20%: INTRAVENOUS | Qty: 500 | Status: AC

## 2018-06-17 MED FILL — Electrolyte-R (PH 7.4) Solution: INTRAVENOUS | Qty: 4000 | Status: AC

## 2018-06-17 MED FILL — Sodium Bicarbonate IV Soln 8.4%: INTRAVENOUS | Qty: 50 | Status: AC

## 2018-06-17 NOTE — Progress Notes (Signed)
CARDIOLOGY RECOMMENDATIONS:  Discharge is anticipated in the next 48 hours. Recommendations for medications and follow up:  Discharge Medications: Continue medications as they are currently listed in the [facility]. Exceptions to the above:  Continue current amiodarone dose for one week then reduce to 400 mg daily.   Will need to titrate ACEi as BP allows as outpatient.   Follow Up: The patient's Primary Cardiologist is Shelva Majestic, MD   Follow up in the office in 2 week(s).  Will need follow up LFTs, BMET and TSH on amiodarone as outpatient.   Signed,  Peter Martinique, MD  10:34 AM 06/17/2018  [facility]

## 2018-06-17 NOTE — Care Management Note (Signed)
Case Management Note Marvetta Gibbons RN, BSN Transitions of Care Unit 4E- RN Case Manager [phone]  Patient Details  Name: Ryan Wagner MRN: [id number] Date of Birth: Oct 01, 1945  Subjective/Objective:  Pt admitted s/p CABG x4                  Action/Plan: PTA pt lived at home with spouse, plan to return home, no CM needs noted for transition home.   Expected Discharge Date:                  Expected Discharge Plan:  Home/Self Care  In-House Referral:  NA  Discharge planning Services  CM Consult  Post Acute Care Choice:  NA Choice offered to:  NA  DME Arranged:    DME Agency:     HH Arranged:    HH Agency:     Status of Service:  Completed, signed off  If discussed at [place] of Stay Meetings, dates discussed:    Discharge Disposition: home/self care   Additional Comments:  Dawayne Patricia, RN 06/17/2018, 10:34 AM

## 2018-06-17 NOTE — Progress Notes (Signed)
CARDIAC REHAB PHASE I   Pt seen ambulating hallway with wife; peak HR 88 SR. Pt denies CP or SOB. Education completed with pt and wife. Pt instructed to shower and monitor incisions daily. Encouraged continued use of IS, walks, and sternal precautions. Pt given in-the-tube sheet, Cardiac Surgery booklet, heart healthy and diabetic diets. Reviewed restrictions and exercise guidelines. Will refer to CRP II [place]. Pt and wife deny any questions or concerns.  [id number] Rufina Falco, RN BSN 06/17/2018 10:04 AM

## 2018-06-17 NOTE — Progress Notes (Signed)
D/C instructions given to patient. Sternal precautions and wound care reviewed. All questions answered. CT sutures removed. IV removed. Wife to escort home.  Clyde Canterbury, RN

## 2018-06-17 NOTE — Progress Notes (Addendum)
Ryan Wagner [address]       [place] [id number]             [phone]      6 Days Post-Op Procedure(s) (LRB): CORONARY ARTERY BYPASS GRAFTING (CABG) x4 using left internal mammary artery and right greater saphenous vein harvested endoscopically. LIMA to distal LAD, Seq SVG to 1st & 2nd OM, SVG to distal PD (N/A) Pulmonary isolation MAZE using bipolar ablation (N/A) TRANSESOPHAGEAL ECHOCARDIOGRAM (TEE) (N/A) CLIPPING OF LEFT ATRIAL APPENDAGE using a 35 AtriCure Clip (N/A) Subjective: Feels pretty well overall  Objective: Vital signs in last 24 hours: Temp:  [97.9 F (36.6 C)-99 F (37.2 C)] 97.9 F (36.6 C) (01/21 0410) Pulse Rate:  [73-106] 73 (01/21 0410) Cardiac Rhythm: Atrial fibrillation (01/20 1902) Resp:  [17-23] 17 (01/21 0410) BP: (125-144)/(70-85) 137/77 (01/21 0410) SpO2:  [96 %-99 %] 98 % (01/21 0410) Weight:  [88 kg] 88 kg (01/21 0439)  Hemodynamic parameters for last 24 hours:    Intake/Output from previous day: 01/20 0701 - 01/21 0700 In: 870 [P.O.:870] Out: -  Intake/Output this shift: No intake/output data recorded.  General appearance: alert, cooperative and no distress Heart: regular rate and rhythm Lungs: clear to auscultation bilaterally Abdomen: benign Extremities: right>Left lower ext edema Wound: incis healing well  Lab Results: No results for input(s): WBC, HGB, HCT, PLT in the last 72 hours. BMET:  Recent Labs    06/15/18 0319  NA 136  K 4.1  CL 103  CO2 23  GLUCOSE 91  BUN 23  CREATININE 1.16  CALCIUM 7.9*    PT/INR: No results for input(s): LABPROT, INR in the last 72 hours. ABG    Component Value Date/Time   PHART 7.369 06/11/2018 2003   HCO3 21.4 06/11/2018 2003   TCO2 21 (L) 06/12/2018 1641   ACIDBASEDEF 4.0 (H) 06/11/2018 2003   O2SAT 91.0 06/11/2018 2003   CBG (last 3)  Recent Labs    06/16/18 1559 06/16/18 2053 06/17/18 0632  GLUCAP 132* 147* 115*    Meds Scheduled Meds: . amiodarone  400  mg Oral BID  . aspirin EC  81 mg Oral Daily  . atorvastatin  80 mg Oral q1800  . enoxaparin (LOVENOX) injection  30 mg Subcutaneous QHS  . esomeprazole  20 mg Oral Daily  . furosemide  40 mg Oral Daily  . insulin aspart  0-24 Units Subcutaneous TID AC & HS  . insulin detemir  10 Units Subcutaneous Daily  . Melatonin  3 mg Oral QHS  . metoprolol tartrate  25 mg Oral BID  . potassium chloride  20 mEq Oral BID  . sodium chloride flush  3 mL Intravenous Q12H   Continuous Infusions: . sodium chloride     PRN Meds:.sodium chloride, acetaminophen, ondansetron **OR** ondansetron (ZOFRAN) IV, oxyCODONE, sodium chloride flush, traMADol  Xrays No results found.  Assessment/Plan: S/P Procedure(s) (LRB): CORONARY ARTERY BYPASS GRAFTING (CABG) x4 using left internal mammary artery and right greater saphenous vein harvested endoscopically. LIMA to distal LAD, Seq SVG to 1st & 2nd OM, SVG to distal PD (N/A) Pulmonary isolation MAZE using bipolar ablation (N/A) TRANSESOPHAGEAL ECHOCARDIOGRAM (TEE) (N/A) CLIPPING OF LEFT ATRIAL APPENDAGE using a 35 AtriCure Clip (N/A)  1 conts to do well 2 current sinus rhythm, has had som interm.PAF with rates in 130's - 140's. Conts amio/lopresor, will start apixiban 5 BID 3 mild volume overload on lasix- cont 4 some htn, will start low dose ACE-I 5 sats  good on RA 6 BS pretty well controlled- cont to push lifestyle and healthy diet modifications- d/c insulin 7 will ask cardiol to see for home med recs prior to d/c   LOS: 6 days    Ryan Giovanni PA-C 06/17/2018 Pager [phone]  Plan d/c home after seen by cardiology I have seen and examined Ryan Wagner and agree with the above assessment  and plan.  Grace Isaac MD Beeper [phone] Office [phone] 06/17/2018 11:14 AM

## 2018-06-23 ENCOUNTER — Telehealth (HOSPITAL_COMMUNITY): Payer: Self-pay

## 2018-06-23 DIAGNOSIS — E119 Type 2 diabetes mellitus without complications: Secondary | ICD-10-CM | POA: Diagnosis not present

## 2018-06-23 NOTE — Telephone Encounter (Signed)
Pt insurance is active and benefits verified through [employer]. Co-pay $0.00, DED $0.00/$0.00 met, out of pocket $5,000.00/$0.00 met, co-insurance 0%. No pre-authorization required. [place]/[employer], 06/19/2018 @ 426PM, REF# [id number]  Will contact patient to see if he is interested in the Cardiac Rehab Program. If interested, patient will need to complete follow up appt. Once completed, patient will be contacted for scheduling upon review by the RN Navigator.

## 2018-06-23 NOTE — Telephone Encounter (Signed)
Attempted to call patient in regards to Cardiac Rehab - LM on VM 

## 2018-07-01 ENCOUNTER — Ambulatory Visit: Payer: PPO | Admitting: Cardiology

## 2018-07-11 ENCOUNTER — Other Ambulatory Visit: Payer: Self-pay | Admitting: Cardiothoracic Surgery

## 2018-07-11 DIAGNOSIS — Z951 Presence of aortocoronary bypass graft: Secondary | ICD-10-CM

## 2018-07-14 ENCOUNTER — Encounter: Payer: Self-pay | Admitting: Physician Assistant

## 2018-07-14 ENCOUNTER — Ambulatory Visit (INDEPENDENT_AMBULATORY_CARE_PROVIDER_SITE_OTHER): Payer: Self-pay | Admitting: Physician Assistant

## 2018-07-14 ENCOUNTER — Ambulatory Visit
Admission: RE | Admit: 2018-07-14 | Discharge: 2018-07-14 | Disposition: A | Discharge status: Home / Self Care | Payer: HMO | Source: Ambulatory Visit | Attending: Cardiothoracic Surgery | Admitting: Cardiothoracic Surgery

## 2018-07-14 VITALS — BP 132/84 | HR 60 | Resp 20 | Ht 67.0 in | Wt 188.0 lb

## 2018-07-14 DIAGNOSIS — J90 Pleural effusion, not elsewhere classified: Secondary | ICD-10-CM | POA: Diagnosis not present

## 2018-07-14 DIAGNOSIS — J9811 Atelectasis: Secondary | ICD-10-CM | POA: Diagnosis not present

## 2018-07-14 DIAGNOSIS — Z951 Presence of aortocoronary bypass graft: Secondary | ICD-10-CM

## 2018-07-14 DIAGNOSIS — I2510 Atherosclerotic heart disease of native coronary artery without angina pectoris: Secondary | ICD-10-CM

## 2018-07-14 MED ORDER — METOPROLOL TARTRATE 25 MG PO TABS: 25.0000 mg | ORAL_TABLET | Freq: Two times a day (BID) | ORAL | 1 refills | Status: DC

## 2018-07-14 MED ORDER — AMIODARONE HCL 200 MG PO TABS: 200.0000 mg | ORAL_TABLET | Freq: Every day | ORAL | 1 refills | Status: DC

## 2018-07-14 NOTE — Patient Instructions (Signed)
You are encouraged to enroll and participate in the outpatient cardiac rehab program beginning as soon as practical.  You may return to driving an automobile as long as you are no longer requiring oral narcotic pain relievers during the daytime.  It would be wise to start driving only short distances during the daylight and gradually increase from there as you feel comfortable.  Make every effort to stay physically active, get some type of exercise on a regular basis, and stick to a "heart healthy diet".  The long term benefits for regular exercise and a healthy diet are critically important to your overall health and wellbeing. 

## 2018-07-14 NOTE — Progress Notes (Signed)
[address]       [place] [address]             [phone]      Ryan Wagner is a 73 y.o. male patient s/p CABG on 06/11/2018 he is here for his routine 4-week follow-up visit.   No diagnosis found. Past Medical History:  Diagnosis Date  . Arthritis    knees, hands  . Atrial fibrillation ([place])   . CAD (coronary artery disease)    a. prior MI in 2004 with DESx3 to RCA  . Complication of anesthesia    had difficulty voiding after hemorrhoidectomy  . Diabetes mellitus without complication ([place])    type 2  . GERD (gastroesophageal reflux disease)    no longer with symptoms  . Headache    migraines as a child  . Hyperlipidemia   . Hypertension   . Sciatica    improved   Past Surgical History Pertinent Negatives:  Procedure Date Noted  . TONSILECTOMY, ADENOIDECTOMY, BILATERAL MYRINGOTOMY AND TUBES 05/19/2014   Scheduled Meds: Current Outpatient Medications on File Prior to Visit  Medication Sig Dispense Refill  . acetaminophen (TYLENOL) 325 MG tablet Take 2 tablets (650 mg total) by mouth every 6 (six) hours as needed for mild pain.    [name] amiodarone (PACERONE) 400 MG tablet Take 1 tablet (400 mg total) by mouth 2 (two) times daily. For one week, then reduce to 400 mg once daily 70 tablet 1  . apixaban (ELIQUIS) 5 MG TABS tablet Take 1 tablet (5 mg total) by mouth 2 (two) times daily. 120 tablet 4  . aspirin EC 81 MG EC tablet Take 1 tablet (81 mg total) by mouth daily.    [name] atorvastatin (LIPITOR) 80 MG tablet Take 1 tablet (80 mg total) by mouth daily at 6 PM. 30 tablet 1  . Cinnamon 500 MG TABS Take 500 mg by mouth daily.    . Coenzyme Q10 (CO Q 10) 100 MG CAPS Take 100 mg by mouth daily.     [name] esomeprazole (NEXIUM) 20 MG capsule Take 20 mg by mouth daily.     [name] 500 MG CAPS Take 500 mg by mouth daily.    [name] L-Arginine 1000 MG TABS Take 1 tablet by mouth 2 (two) times daily. L-Arginine 1000 mg / L-Citrulline    . Liraglutide (VICTOZA)  18 MG/3ML SOLN Inject 1.2 mg into the skin daily.     [name] lisinopril (PRINIVIL,ZESTRIL) 5 MG tablet Take 1 tablet (5 mg total) by mouth daily. 30 tablet 1  . MAGNESIUM GLUCONATE PO Take 150 mg by mouth daily.    . metFORMIN (GLUCOPHAGE) 500 MG tablet Take 500 mg by mouth 2 (two) times daily with a meal.    . metoprolol tartrate (LOPRESSOR) 25 MG tablet Take 1 tablet (25 mg total) by mouth 2 (two) times daily. 30 tablet 1  . Multiple Vitamin (MULTIVITAMIN) tablet Take 1 tablet by mouth daily.    . Turmeric 500 MG CAPS Take 500 mg by mouth daily.     No current facility-administered medications on file prior to visit.     Blood pressure 132/84, pulse 60, resp. rate 20, height 5\' 7"  (1.702 m), weight 85.3 kg, SpO2 97 %.  Subjective Ryan Wagner is a 73 year old male patient status post coronary bypass grafting x4 on June 11, 2018.  Overall he has been recovering well.  He has been doing some walking without any shortness  of breath.  He has some questions about his medications.  Objective    Cor: RRR, no murmur Pulm: CTA bilaterally and in all fields Abd: no tenderness Wound: EVH site and sternotomy incisions are c/d/i Ext: trivial edema in right lower extremity (vein harvest leg)  CLINICAL DATA:  Status post CABG x4.  Hypertension.  EXAM: CHEST - 2 VIEW  COMPARISON:  June 14, 2018  FINDINGS: The heart size and mediastinal contours are stable. There is mild atelectasis of left lung base with small left pleural effusion. Right lung is clear. The visualized skeletal structures are stable.  IMPRESSION: Mild atelectasis of left lung base with small left pleural effusion.   Electronically Signed   By: Abelardo Diesel M.D.   On: 07/14/2018 15:11  Assessment & Plan   Ryan Wagner is a 72 year old male status post coronary bypass grafting x4 with Dr. Pia Mau on 06/11/2018.  He is recovering quite well since surgery.  He has not taken much pain medicine and I have cleared him  for driving today.  He is asking a couple questions about his medications.  I did decrease his amiodarone to 200 mg daily since he has not had any other episodes of atrial fibrillation.  He is having some nausea and GI symptoms that he feels is related to this medication, so hopefully with the reduced dose the symptoms will subside.  He was also taking some ibuprofen for pain which I encouraged him to discontinue promptly.  I discussed the black box warning in CABG patients and to only use Tylenol if he is having pain.  He also was wondering why he was started on lisinopril versus his home blood pressure medicine before surgery which was a combination pill of amlodipine and Benzapril.  His blood pressure is currently well controlled on low-dose lisinopril, therefore I am not going to make any changes at this point.  He is going to see Dr. Claiborne Billings in April and he may address this at that appointment if he feels the changes indicated.  He was also started on Eliquis in the hospital and asked if it could be discontinued at some point in the future if he does not go back into atrial fibrillation.  This is mainly a cardiology question, but most likely if he does not have any recurrent atrial fibrillation he could stop his anticoagulant.  However, he does have 3 drug-eluting stents.  I will defer to cardiology.  He also requested a refill of his metoprolol 25 mg twice daily since the original prescription only dispensed 30 tabs which lasts him only 2 weeks.  I renewed the prescription and wrote for 60 tabs which should last him a full month.  He has remained in normal sinus rhythm and his rate is in the 60s on exam.  I reviewed the chest x-ray with the patient and answered all questions to their satisfaction.  There is a small left pleural effusion present on chest x-ray but it should not require any intervention at this time.  Mr. Nannini is interested in cardiac rehab and I provided a referral during today's visit.  I do  not think he needs to return to the office unless other symptoms arise.  He is always encouraged to call our office with any questions or concerns.  Refills/New prescriptions: Metoprolol 25 mg twice daily, Amiodarone 200 mg daily  Elgie Collard 07/14/2018

## 2018-07-15 ENCOUNTER — Other Ambulatory Visit: Payer: Self-pay

## 2018-07-23 ENCOUNTER — Ambulatory Visit (INDEPENDENT_AMBULATORY_CARE_PROVIDER_SITE_OTHER): Payer: HMO | Admitting: Cardiology

## 2018-07-23 DIAGNOSIS — I480 Paroxysmal atrial fibrillation: Secondary | ICD-10-CM

## 2018-07-23 DIAGNOSIS — I10 Essential (primary) hypertension: Secondary | ICD-10-CM

## 2018-07-23 DIAGNOSIS — G959 Disease of spinal cord, unspecified: Secondary | ICD-10-CM | POA: Diagnosis not present

## 2018-07-23 DIAGNOSIS — Z7901 Long term (current) use of anticoagulants: Secondary | ICD-10-CM | POA: Diagnosis not present

## 2018-07-23 DIAGNOSIS — I2510 Atherosclerotic heart disease of native coronary artery without angina pectoris: Secondary | ICD-10-CM

## 2018-07-23 DIAGNOSIS — Z9861 Coronary angioplasty status: Secondary | ICD-10-CM

## 2018-07-23 DIAGNOSIS — Z951 Presence of aortocoronary bypass graft: Secondary | ICD-10-CM

## 2018-07-23 MED ORDER — LOSARTAN POTASSIUM 50 MG PO TABS: 50.0000 mg | ORAL_TABLET | Freq: Every day | ORAL | 10 refills | Status: DC

## 2018-07-23 MED ORDER — ATORVASTATIN CALCIUM 40 MG PO TABS: 80.0000 mg | ORAL_TABLET | Freq: Every day | ORAL | 10 refills | Status: DC

## 2018-07-23 NOTE — Patient Instructions (Signed)
Medication Instructions:  STOP Lisinopril START Losartan 50mg  take 1 tablet once a day If you need a refill on your cardiac medications before your next appointment, please call your pharmacy.   Lab work: None  If you have labs (blood work) drawn today and your tests are completely normal, you will receive your results only by: [name] MyChart Message (if you have MyChart) OR . A paper copy in the mail If you have any lab test that is abnormal or we need to change your treatment, we will call you to review the results.  Testing/Procedures: None   Follow-Up: At [facility], you and your health needs are our priority.  As part of our continuing mission to provide you with exceptional heart care, we have created designated Provider Care Teams.  These Care Teams include your primary Cardiologist (physician) and Advanced Practice Providers (APPs -  Physician Assistants and Nurse Practitioners) who all work together to provide you with the care you need, when you need it.  . Your physician recommends that you schedule a follow-up appointment in: 2-3 weeks with Ryan Ransom, PA-C  Any Other Special Instructions Will Be Listed Below (If Applicable).

## 2018-07-23 NOTE — Assessment & Plan Note (Signed)
B/P by me 148/82- add Losartan 50 mg.

## 2018-07-23 NOTE — Assessment & Plan Note (Signed)
He says he has had problems swallowing the 80 mg Lipitor ever since his neck surgery.  He is able to take 240 mg tablets and will send in a prescription for that.

## 2018-07-23 NOTE — Assessment & Plan Note (Signed)
RCA DES 2004

## 2018-07-23 NOTE — Progress Notes (Signed)
07/23/2018 Ryan Wagner   07-25-1945  [id number]  Primary Physician Shirline Frees, MD Primary Cardiologist: Dr Claiborne Billings  HPI: [name] is a pleasant 73 year old male followed by Dr. Claiborne Billings.  He was previously followed by Dr. Rex Kras.  He had an inferior MI in 2004 and treated with multiple RCA stents.  Other medical issues include hypertension, dyslipidemia, non-insulin-dependent diabetes, and cervical spinal stenosis.  He did have paroxysmal atrial fibrillation diagnosed in September 2019.  He was felt to be CHADS VASC 4 and was placed on Eliquis.  We saw him in October prior to neck surgery.  This was done in November and he tolerated it well.  He saw Dr. Claiborne Billings in January 2020 and the patient had new T wave inversion on his EKG.  Dr. Claiborne Billings was suspicious and set him up for diagnostic catheterization which revealed significant diffuse multivessel CAD.  The patient was seen by Dr. [name] and underwent bypass grafting x4 on June 11, 2018.  He did well postop, he did have recurrent PAF the day prior to admission and was kept an extra 24 hours and placed on amiodarone.  He was seen in follow-up July 14, 2018 at the surgeon's office.  He has been doing well, he has been released from their standpoint.  He never did receive a call from our office for follow-up, when he finally called he was given this appointment.  He continues to do well.  He denies any unusual dyspnea.  He has not had tachycardia, he does wear an apple watch and checks this daily.  He has noted a slight tremor, he is read online that this could be secondary to amiodarone.  I should note that the amiodarone was cut back to 200 mg daily when he saw the surgeon on 07/14/2018.   Current Outpatient Medications  Medication Sig Dispense Refill  . acetaminophen (TYLENOL) 325 MG tablet Take 2 tablets (650 mg total) by mouth every 6 (six) hours as needed for mild pain.    [name] amiodarone (PACERONE) 200 MG tablet Take 1 tablet (200 mg  total) by mouth daily. 30 tablet 1  . apixaban (ELIQUIS) 5 MG TABS tablet Take 1 tablet (5 mg total) by mouth 2 (two) times daily. 120 tablet 4  . aspirin EC 81 MG EC tablet Take 1 tablet (81 mg total) by mouth daily.    . Cinnamon 500 MG TABS Take 500 mg by mouth daily.    . Coenzyme Q10 (CO Q 10) 100 MG CAPS Take 100 mg by mouth daily.     [name] esomeprazole (NEXIUM) 20 MG capsule Take 20 mg by mouth daily.     [name] 500 MG CAPS Take 500 mg by mouth daily.    [name] L-Arginine 1000 MG TABS Take 1 tablet by mouth 2 (two) times daily. L-Arginine 1000 mg / L-Citrulline    . Liraglutide (VICTOZA) 18 MG/3ML SOLN Inject 1.2 mg into the skin daily.     [name] MAGNESIUM GLUCONATE PO Take 150 mg by mouth daily.    . metFORMIN (GLUCOPHAGE) 500 MG tablet Take 500 mg by mouth 2 (two) times daily with a meal.    . metoprolol tartrate (LOPRESSOR) 25 MG tablet Take 1 tablet (25 mg total) by mouth 2 (two) times daily. 60 tablet 1  . Multiple Vitamin (MULTIVITAMIN) tablet Take 1 tablet by mouth daily.    . Turmeric 500 MG CAPS Take 500 mg by mouth daily.    [name] atorvastatin (LIPITOR) 40  MG tablet Take 2 tablets (80 mg total) by mouth daily. 60 tablet 10  . losartan (COZAAR) 50 MG tablet Take 1 tablet (50 mg total) by mouth daily. 30 tablet 10   No current facility-administered medications for this visit.     No Known Allergies  Past Medical History:  Diagnosis Date  . Arthritis    knees, hands  . Atrial fibrillation ([place])   . CAD (coronary artery disease)    a. prior MI in 2004 with DESx3 to RCA  . Complication of anesthesia    had difficulty voiding after hemorrhoidectomy  . Diabetes mellitus without complication ([place])    type 2  . GERD (gastroesophageal reflux disease)    no longer with symptoms  . Headache    migraines as a child  . Hyperlipidemia   . Hypertension   . Sciatica    improved    Social History   Socioeconomic History  . Marital status: Married    Spouse name: Not on file  .  Number of children: Not on file  . Years of education: Not on file  . Highest education level: Not on file  Occupational History  . Not on file  Social Needs  . Financial resource strain: Not on file  . Food insecurity:    Worry: Not on file    Inability: Not on file  . Transportation needs:    Medical: Not on file    Non-medical: Not on file  Tobacco Use  . Smoking status: Never Smoker  . Smokeless tobacco: Never Used  Substance and Sexual Activity  . Alcohol use: Yes    Comment: occasional  . Drug use: No  . Sexual activity: Not on file  Lifestyle  . Physical activity:    Days per week: Not on file    Minutes per session: Not on file  . Stress: Not on file  Relationships  . Social connections:    Talks on phone: Not on file    Gets together: Not on file    Attends religious service: Not on file    Active member of club or organization: Not on file    Attends meetings of clubs or organizations: Not on file    Relationship status: Not on file  . Intimate partner violence:    Fear of current or ex partner: Not on file    Emotionally abused: Not on file    Physically abused: Not on file    Forced sexual activity: Not on file  Other Topics Concern  . Not on file  Social History Narrative  . Not on file     Family History  Problem Relation Age of Onset  . Dementia Mother   . Thyroid disease Mother   . Dementia Father   . High Cholesterol Father      Review of Systems: General: negative for chills, fever, night sweats or weight changes.  Cardiovascular: negative for chest pain, dyspnea on exertion, edema, orthopnea, palpitations, paroxysmal nocturnal dyspnea or shortness of breath Dermatological: negative for rash Respiratory: negative for cough or wheezing Urologic: negative for hematuria Abdominal: negative for nausea, vomiting, diarrhea, bright red blood per rectum, melena, or hematemesis Neurologic: negative for visual changes, syncope, or dizziness All  other systems reviewed and are otherwise negative except as noted above.    Blood pressure (!) 200/110, pulse 60, height 5\' 7"  (1.702 m), weight 185 lb 3.2 oz (84 kg).  General appearance: alert, cooperative and no distress Neck: no carotid  bruit and no JVD Lungs: clear to auscultation bilaterally Heart: regular rate and rhythm Extremities: no edema Skin: Skin color, texture, turgor normal. No rashes or lesions Neurologic: Grossly normal  EKG NSR-60. QTc 456. , TWI leads 1, 2 V5-V6  ASSESSMENT AND PLAN:   Hx of CABG CABG x 4 06/11/2018- LIMA-LAD, SVG-OM1-OM2, SVG-PDA  Essential hypertension B/P by me 148/82- add Losartan 50 mg.  PAF (paroxysmal atrial fibrillation) ([place]) Pt had documented brief episodes of PAF Sept 2019 (pre CABG) and an episode prior to discharge post CABG- discharged on Amiodarone.  Dyslipidemia, goal LDL below 70 He says he has had problems swallowing the 80 mg Lipitor ever since his neck surgery.  He is able to take 240 mg tablets and will send in a prescription for that.  Chronic anticoagulation CHADS VASC=4.  Eliquis was started prior to CABG for OP documented PAF.   CAD S/P percutaneous coronary angioplasty RCA DES 2004  Cervical myelopathy [facility]) S/P anterior C-spine surgery Nov 2019   PLAN [name] was on Lotrel 10/20 pre CABG.  He was discharged on lisinopril 5 mg.  His B/P is not well controlled.  He also complains of a dry cough.  I suggested we add losartan 50 mg.  I will see him back in follow-up in a couple weeks.  He asked about how long he would have to be on amiodarone and Eliquis, I will defer that to Dr. Claiborne Billings.  He sees him in April.  Kerin Ransom PA-C 07/23/2018 11:38 AM

## 2018-07-23 NOTE — Assessment & Plan Note (Signed)
CABG x 4 06/11/2018- LIMA-LAD, SVG-OM1-OM2, SVG-PDA

## 2018-07-23 NOTE — Assessment & Plan Note (Signed)
CHADS VASC=4.  Eliquis was started prior to CABG for OP documented PAF.

## 2018-07-23 NOTE — Assessment & Plan Note (Signed)
Pt had documented brief episodes of PAF Sept 2019 (pre CABG) and an episode prior to discharge post CABG- discharged on Amiodarone.

## 2018-07-23 NOTE — Assessment & Plan Note (Signed)
S/P anterior C-spine surgery Nov 2019

## 2018-07-25 ENCOUNTER — Telehealth (HOSPITAL_COMMUNITY): Payer: Self-pay

## 2018-07-25 NOTE — Telephone Encounter (Signed)
Called patient to see if he was interested in participating in the Cardiac Rehab Program. Patient stated yes. Patient will come in for orientation on 08/14/2018 @ 830AM and will attend the 245PM exercise class. Went over insurance, patient verbalized understanding.   Mailed homework package.

## 2018-07-30 ENCOUNTER — Telehealth: Payer: Self-pay | Admitting: Cardiovascular Disease

## 2018-07-30 NOTE — Telephone Encounter (Signed)
Spoke to patient Ryan Wagner advised to increase Losartan to 100 mg daily.Advised to keep appointment with Kerin Ransom PA 08/13/18 at 10:00 am.Bmet to be done at visit.

## 2018-07-30 NOTE — Telephone Encounter (Signed)
Returned call to patient.He stated he saw Kerin Ransom PA 07/23/18.He prescribed Losartan 50 mg daily.Stated his B/P continues to be elevated, today 200/109 pulse 50 to 79.Stated when he was discharged after CABG 06/11/18 his medication was changed.He use to take Metoprolol 50 mg twice a day and Lotrel 10/20 mg daily and his B/P was good.Advised I will send message to pharmacy for advice.

## 2018-07-30 NOTE — Telephone Encounter (Signed)
  Pt c/o medication issue:  1. Name of Medication: losartan (COZAAR) 50 MG tablet  2. How are you currently taking this medication (dosage and times per day)? Take 1 tablet (50 mg total) by mouth daily  3. Are you having a reaction (difficulty breathing--STAT)? No  4. What is your medication issue? Patient says that this medication which is new is not working. He has taken as prescribed but his BP is 200/109 and he is having headaches. Patient wants to see if he can go back on a medication he used to take that worked until his appt with [employer].  Please call.

## 2018-07-30 NOTE — Telephone Encounter (Signed)
Have patient increase losartan to 100 mg daily.  Will need repeat BMET when he sees [place] in 2 weeks. Be sure to keep that appointment.

## 2018-07-31 ENCOUNTER — Other Ambulatory Visit: Payer: Self-pay

## 2018-07-31 NOTE — Patient Outreach (Signed)
  [facility]) Care Management Chronic Special Needs Program  07/31/2018  Name: Ryan Wagner DOB: 05/26/1946  MRN: [id number]  [name] is enrolled in a chronic special needs plan for  Diabetes. A completed health risk assessment has not been received from the client and client has not responded to outreach attempts by their health care concierge.  The client's individualized care plan was developed based on available data.  Plan:  . Send unsuccessful outreach letter with a copy of individualized care plan to client . Send individualized care plan to provider  Chronic care management coordinator will attempt outreach in 2-4 months.  Thea Silversmith, RN, MSN, [place] [phone]

## 2018-08-04 ENCOUNTER — Telehealth: Payer: Self-pay | Admitting: Cardiovascular Disease

## 2018-08-04 MED ORDER — AMLODIPINE BESYLATE 10 MG PO TABS: 10.0000 mg | ORAL_TABLET | Freq: Every day | ORAL | 6 refills | Status: DC

## 2018-08-04 MED ORDER — LOSARTAN POTASSIUM 100 MG PO TABS: 100.0000 mg | ORAL_TABLET | Freq: Every day | ORAL | 3 refills | Status: DC

## 2018-08-04 NOTE — Telephone Encounter (Signed)
Spoke to patient Ryan Wagner's recommendation given.Amlodipine prescription sent to pharmacy.Advised to keep appointment as planned with Kerin Ransom PA 08/13/18 at 10:00 am.

## 2018-08-04 NOTE — Telephone Encounter (Signed)
Returned call to patient he stated Losartan was increased to 100 mg daily on 07/30/18.Stated B/P is still elevated,170 to 180 / 90 to 100.Pulse 50 to 60.Advised has not been long enough.Stated he is worried since B/P has been elevated so long.Advised I will send message to pharmacy for advice/

## 2018-08-04 NOTE — Telephone Encounter (Signed)
Was previously on Lotrel 10/20.  Can start amlodipine 10 mg once daily in addition to losartan 100 mg once daily.  Note that it can take 5-10 days for steady drop in BP readings

## 2018-08-04 NOTE — Telephone Encounter (Signed)
New Message   Patient called to speak to [facility] again.

## 2018-08-08 NOTE — Progress Notes (Signed)
STORY BRAKEMAN 73 y.o. male DOB 27-Oct-1945 MRN [id number]       Nutrition Screen Note  No diagnosis found. Past Medical History:  Diagnosis Date  . Arthritis    knees, hands  . Atrial fibrillation ([place])   . CAD (coronary artery disease)    a. prior MI in 2004 with DESx3 to RCA  . Complication of anesthesia    had difficulty voiding after hemorrhoidectomy  . Diabetes mellitus without complication ([place])    type 2  . GERD (gastroesophageal reflux disease)    no longer with symptoms  . Headache    migraines as a child  . Hyperlipidemia   . Hypertension   . Sciatica    improved   Meds reviewed.    Current Outpatient Medications (Endocrine & Metabolic):  .  Liraglutide (VICTOZA) 18 MG/3ML SOLN, Inject 1.2 mg into the skin daily.  .  metFORMIN (GLUCOPHAGE) 500 MG tablet, Take 500 mg by mouth 2 (two) times daily with a meal.  Current Outpatient Medications (Cardiovascular):  .  amiodarone (PACERONE) 200 MG tablet, Take 1 tablet (200 mg total) by mouth daily. [name]  amLODipine (NORVASC) 10 MG tablet, Take 1 tablet (10 mg total) by mouth daily. [name]  atorvastatin (LIPITOR) 40 MG tablet, Take 2 tablets (80 mg total) by mouth daily. [name]  losartan (COZAAR) 100 MG tablet, Take 1 tablet (100 mg total) by mouth daily. .  metoprolol tartrate (LOPRESSOR) 25 MG tablet, Take 1 tablet (25 mg total) by mouth 2 (two) times daily.   Current Outpatient Medications (Analgesics):  .  acetaminophen (TYLENOL) 325 MG tablet, Take 2 tablets (650 mg total) by mouth every 6 (six) hours as needed for mild pain. [name]  aspirin EC 81 MG EC tablet, Take 1 tablet (81 mg total) by mouth daily.  Current Outpatient Medications (Hematological):  .  apixaban (ELIQUIS) 5 MG TABS tablet, Take 1 tablet (5 mg total) by mouth 2 (two) times daily.  Current Outpatient Medications (Other):  .  Cinnamon 500 MG TABS, Take 500 mg by mouth daily. .  Coenzyme Q10 (CO Q 10) 100 MG CAPS, Take 100 mg by mouth daily.  [name]  esomeprazole  (NEXIUM) 20 MG capsule, Take 20 mg by mouth daily.  [name] 500 MG CAPS, Take 500 mg by mouth daily. [name]  L-Arginine 1000 MG TABS, Take 1 tablet by mouth 2 (two) times daily. L-Arginine 1000 mg / L-Citrulline .  MAGNESIUM GLUCONATE PO, Take 150 mg by mouth daily. .  Multiple Vitamin (MULTIVITAMIN) tablet, Take 1 tablet by mouth daily. .  Turmeric 500 MG CAPS, Take 500 mg by mouth daily.   HT: Ht Readings from Last 1 Encounters:  07/23/18 '5\' 7"'$  (1.702 m)    WT: Wt Readings from Last 5 Encounters:  07/23/18 185 lb 3.2 oz (84 kg)  07/14/18 188 lb (85.3 kg)  06/17/18 194 lb (88 kg)  06/05/18 196 lb 12.8 oz (89.3 kg)  04/15/18 200 lb 11.2 oz (91 kg)     BMI = 29   Current tobacco use? No       Labs:  Lipid Panel  No results found for: CHOL, TRIG, HDL, CHOLHDL, VLDL, LDLCALC, LDLDIRECT  Lab Results  Component Value Date   HGBA1C 5.9 (H) 04/07/2018   CBG (last 3)  No results for input(s): GLUCAP in the last 72 hours.  Nutrition Diagnosis ? Food-and nutrition-related knowledge deficit related to lack of exposure to information as related to diagnosis of: ?  CVD ? Type 2 Diabetes ? Overweight  related to excessive energy intake as evidenced by a BMI = 29  Nutrition Goal(s):  ? To be determined  Plan:  Pt to attend nutrition classes ? Nutrition I ? Nutrition II ? Portion Distortion  ? Diabetes Blitz ? Diabetes Q & A Will provide client-centered nutrition education as part of interdisciplinary care.   Monitor and evaluate progress toward nutrition goal with team.  Laurina Bustle, MS, RD, LDN 08/08/2018 9:46 AM

## 2018-08-11 ENCOUNTER — Telehealth (HOSPITAL_COMMUNITY): Payer: Self-pay

## 2018-08-11 NOTE — Telephone Encounter (Signed)
Called pt to let him know that we be closed for the next 2 weeks. Pt verbalized understanding. Canceled apts until then

## 2018-08-13 ENCOUNTER — Other Ambulatory Visit: Payer: Self-pay

## 2018-08-13 ENCOUNTER — Ambulatory Visit (INDEPENDENT_AMBULATORY_CARE_PROVIDER_SITE_OTHER): Payer: HMO | Admitting: Cardiology

## 2018-08-13 ENCOUNTER — Encounter: Payer: Self-pay | Admitting: Cardiology

## 2018-08-13 VITALS — BP 138/58 | HR 60 | Ht 67.0 in | Wt 181.8 lb

## 2018-08-13 DIAGNOSIS — E119 Type 2 diabetes mellitus without complications: Secondary | ICD-10-CM | POA: Diagnosis not present

## 2018-08-13 DIAGNOSIS — I10 Essential (primary) hypertension: Secondary | ICD-10-CM

## 2018-08-13 MED ORDER — ATORVASTATIN CALCIUM 80 MG PO TABS: 80.0000 mg | ORAL_TABLET | Freq: Every day | ORAL | 10 refills | Status: DC

## 2018-08-13 NOTE — Progress Notes (Addendum)
08/14/2018 Ryan Wagner   [birth year]  [id number]  Primary Physician Shirline Frees, MD Primary Cardiologist: Dr Claiborne Billings  HPI:  The patient is a pleasant 73 year old male followed by Dr. Claiborne Billings.  He had an inferior MI in 2004 and treated with multiple RCA stents.  Other medical issues include HTN, HLD, NIDDM, and cervical spinal stenosis.  He did have PAF diagnosed in September 2019.  He was felt to be CHADS VASC 4 and was placed on Eliquis.  We saw him in October prior to neck surgery.  This was done in November and he tolerated it well.  He saw Dr. Claiborne Billings in January 2020 and the patient had new T wave inversion on his EKG.  Dr. Claiborne Billings was suspicious and set him up for diagnostic catheterization which revealed significant diffuse multivessel CAD.  The patient was seen by Dr. [name] and underwent bypass grafting x4 on June 11, 2018.  He did well postop, he did have recurrent PAF the day prior to discharge and was kept an extra 24 hours and placed on amiodarone.    He was seen in the office 07/23/2018 and was doing well. He was in NSR.  He was hypertensive and his medications were adjusted (taken off ACE secondary to cough and placed on ARB).  He returns now for f/u.  Since I saw him last his medications have been adjusted twice by our pharmacist.  His losartan was increased to 100 mg daily and a few days later amlodipine 10 mg was added, he was previously on a combination of amlodipine and ACE inhibitor.  In the office today he says he feels well.  He says his blood pressure at home is still reading in the [id number] systolic but when he checked in it was in the 130s and when I took his blood pressure manually it was 99991111 systolic over 70.  He denies any tachycardia.  He had mentioned a slight tremor on his previous office visit and asked if that was secondary to amiodarone.  He says his tremor is now very slight and less noticeable and intermittent.  I suggested he continue with his amiodarone until he  sees Dr. Claiborne Billings in follow-up in April.     Current Outpatient Medications  Medication Sig Dispense Refill  . acetaminophen (TYLENOL) 325 MG tablet Take 2 tablets (650 mg total) by mouth every 6 (six) hours as needed for mild pain.    [name] amiodarone (PACERONE) 200 MG tablet Take 1 tablet (200 mg total) by mouth daily. 30 tablet 1  . amLODipine (NORVASC) 10 MG tablet Take 1 tablet (10 mg total) by mouth daily. 30 tablet 6  . apixaban (ELIQUIS) 5 MG TABS tablet Take 1 tablet (5 mg total) by mouth 2 (two) times daily. 120 tablet 4  . aspirin EC 81 MG EC tablet Take 1 tablet (81 mg total) by mouth daily.    [name] atorvastatin (LIPITOR) 80 MG tablet Take 1 tablet (80 mg total) by mouth daily. 30 tablet 10  . Cinnamon 500 MG TABS Take 500 mg by mouth daily.    . Coenzyme Q10 (CO Q 10) 100 MG CAPS Take 100 mg by mouth daily.     [name] esomeprazole (NEXIUM) 20 MG capsule Take 20 mg by mouth daily.     [name] 500 MG CAPS Take 500 mg by mouth daily.    [name] L-Arginine 1000 MG TABS Take 1 tablet by mouth 2 (two) times daily. L-Arginine 1000 mg / L-Citrulline    .  Liraglutide (VICTOZA) 18 MG/3ML SOLN Inject 1.2 mg into the skin daily.     [name] losartan (COZAAR) 100 MG tablet Take 1 tablet (100 mg total) by mouth daily. 90 tablet 3  . MAGNESIUM GLUCONATE PO Take 150 mg by mouth daily.    . metFORMIN (GLUCOPHAGE) 500 MG tablet Take 500 mg by mouth 2 (two) times daily with a meal.    . metoprolol tartrate (LOPRESSOR) 25 MG tablet Take 1 tablet (25 mg total) by mouth 2 (two) times daily. 60 tablet 1  . Multiple Vitamin (MULTIVITAMIN) tablet Take 1 tablet by mouth daily.    . Turmeric 500 MG CAPS Take 500 mg by mouth daily.     No current facility-administered medications for this visit.     Allergies  Allergen Reactions  . Ace Inhibitors Cough    Past Medical History:  Diagnosis Date  . Arthritis    knees, hands  . Atrial fibrillation ([place])   . CAD (coronary artery disease)    a. prior MI in 2004 with  DESx3 to RCA  . Complication of anesthesia    had difficulty voiding after hemorrhoidectomy  . Diabetes mellitus without complication ([place])    type 2  . GERD (gastroesophageal reflux disease)    no longer with symptoms  . Headache    migraines as a child  . Hyperlipidemia   . Hypertension   . Sciatica    improved    Social History   Socioeconomic History  . Marital status: Married    Spouse name: Not on file  . Number of children: Not on file  . Years of education: Not on file  . Highest education level: Not on file  Occupational History  . Not on file  Social Needs  . Financial resource strain: Not on file  . Food insecurity:    Worry: Not on file    Inability: Not on file  . Transportation needs:    Medical: Not on file    Non-medical: Not on file  Tobacco Use  . Smoking status: Never Smoker  . Smokeless tobacco: Never Used  Substance and Sexual Activity  . Alcohol use: Yes    Comment: occasional  . Drug use: No  . Sexual activity: Not on file  Lifestyle  . Physical activity:    Days per week: Not on file    Minutes per session: Not on file  . Stress: Not on file  Relationships  . Social connections:    Talks on phone: Not on file    Gets together: Not on file    Attends religious service: Not on file    Active member of club or organization: Not on file    Attends meetings of clubs or organizations: Not on file    Relationship status: Not on file  . Intimate partner violence:    Fear of current or ex partner: Not on file    Emotionally abused: Not on file    Physically abused: Not on file    Forced sexual activity: Not on file  Other Topics Concern  . Not on file  Social History Narrative  . Not on file     Family History  Problem Relation Age of Onset  . Dementia Mother   . Thyroid disease Mother   . Dementia Father   . High Cholesterol Father      Review of Systems: General: negative for chills, fever, night sweats or weight changes.    Cardiovascular: negative for  chest pain, dyspnea on exertion, edema, orthopnea, palpitations, paroxysmal nocturnal dyspnea or shortness of breath Dermatological: negative for rash Respiratory: negative for cough or wheezing Urologic: negative for hematuria Abdominal: negative for nausea, vomiting, diarrhea, bright red blood per rectum, melena, or hematemesis Neurologic: negative for visual changes, syncope, or dizziness All other systems reviewed and are otherwise negative except as noted above.    Blood pressure (!) 138/58, pulse 60, height '5\' 7"'$  (1.702 m), weight 181 lb 12.8 oz (82.5 kg).  General appearance: alert, cooperative and no distress Neck: no JVD Lungs: decreased Rt base otherwise clear Heart: regular rate and rhythm Extremities: no edema Skin: Skin color, texture, turgor normal. No rashes or lesions Neurologic: Grossly normal, no tremor noted   ASSESSMENT AND PLAN:   Essential hypertension B/P by me 116/70- I asked him to bring his cuff from home to his next OV, it seems to be reading high  Hx of CABG CABG x 4 06/11/2018- LIMA-LAD, SVG-OM1-OM2, SVG-PDA  PAF (paroxysmal atrial fibrillation) ([place]) Pt had documented brief episodes of PAF Sept 2019 (pre CABG) and an episode prior to discharge post CABG- discharged on Amiodarone. He has been holding NSR since discharge.[name]  Dyslipidemia, goal LDL below 70 He says he has had problems swallowing the 80 mg Lipitor ever since his neck surgery.  His insurance wouldn't approve Lipitor 40 mg -two daily.  He is back on one 80 mg tablet daily.  Chronic anticoagulation CHADS VASC=4.  Eliquis was started prior to CABG for OP documented PAF.  CAD S/P percutaneous coronary angioplasty RCA DES 2004  Cervical myelopathy ([facility]) S/P anterior C-spine surgery Nov 2019  PLAN  I asked him to continue to monitor his B/P at home and let us know if his B/P starts running low or he starts to have hypotension.  Keep F/U with Dr Claiborne Billings in  April.  Check BMP today on increased Losartan-(this may need to be changed at his next refill secondary to drug shortage).   Kerin Ransom PA-C 08/14/2018 10:00 AM

## 2018-08-13 NOTE — Patient Instructions (Signed)
Medication Instructions:  Your physician recommends that you continue on your current medications as directed. Please refer to the Current Medication list given to you today.  If you need a refill on your cardiac medications before your next appointment, please call your pharmacy.   Lab work: Your physician recommends that you return for lab work in: TODAY-BMET, A1C If you have labs (blood work) drawn today and your tests are completely normal, you will receive your results only by: [name] MyChart Message (if you have MyChart) OR . A paper copy in the mail If you have any lab test that is abnormal or we need to change your treatment, we will call you to review the results.  Testing/Procedures: NONE   Follow-Up: At [facility], you and your health needs are our priority.  As part of our continuing mission to provide you with exceptional heart care, we have created designated Provider Care Teams.  These Care Teams include your primary Cardiologist (physician) and Advanced Practice Providers (APPs -  Physician Assistants and Nurse Practitioners) who all work together to provide you with the care you need, when you need it. . FOLLOW UP AS SCHEDULED WITH DR Claiborne Billings.  Any Other Special Instructions Will Be Listed Below (If Applicable).

## 2018-08-14 ENCOUNTER — Inpatient Hospital Stay (HOSPITAL_COMMUNITY)
Admission: RE | Admit: 2018-08-14 | Discharge: 2018-08-14 | Disposition: A | Discharge status: Home / Self Care | Payer: HMO | Source: Ambulatory Visit

## 2018-08-14 LAB — BASIC METABOLIC PANEL
BUN/Creatinine Ratio: 14 (ref 10–24)
BUN: 12 mg/dL (ref 8–27)
CO2: 21 mmol/L (ref 20–29)
Calcium: 9.1 mg/dL (ref 8.6–10.2)
Chloride: 102 mmol/L (ref 96–106)
Creatinine, Ser: 0.87 mg/dL (ref 0.76–1.27)
GFR calc Af Amer: 101 mL/min/{1.73_m2} (ref >59)
GFR calc non Af Amer: 87 mL/min/{1.73_m2} (ref >59)
Glucose: 132 mg/dL — ABNORMAL HIGH (ref 65–99)
Potassium: 4.3 mmol/L (ref 3.5–5.2)
Sodium: 139 mmol/L (ref 134–144)

## 2018-08-14 LAB — HEMOGLOBIN A1C
Est. average glucose Bld gHb Est-mCnc: 111 mg/dL
Hgb A1c MFr Bld: 5.5 % (ref 4.8–5.6)

## 2018-08-18 ENCOUNTER — Ambulatory Visit (HOSPITAL_COMMUNITY): Payer: HMO

## 2018-08-20 ENCOUNTER — Ambulatory Visit (HOSPITAL_COMMUNITY): Payer: HMO

## 2018-08-22 ENCOUNTER — Ambulatory Visit (HOSPITAL_COMMUNITY): Payer: HMO

## 2018-08-25 ENCOUNTER — Ambulatory Visit (HOSPITAL_COMMUNITY): Payer: HMO

## 2018-08-26 ENCOUNTER — Other Ambulatory Visit: Payer: Self-pay | Admitting: Physician Assistant

## 2018-08-27 ENCOUNTER — Other Ambulatory Visit: Payer: Self-pay

## 2018-08-27 ENCOUNTER — Ambulatory Visit (HOSPITAL_COMMUNITY): Payer: HMO

## 2018-08-27 MED ORDER — METOPROLOL TARTRATE 25 MG PO TABS: 25.0000 mg | ORAL_TABLET | Freq: Two times a day (BID) | ORAL | 3 refills | Status: DC

## 2018-08-29 ENCOUNTER — Ambulatory Visit (HOSPITAL_COMMUNITY): Payer: HMO

## 2018-09-01 ENCOUNTER — Ambulatory Visit (HOSPITAL_COMMUNITY): Payer: HMO

## 2018-09-03 ENCOUNTER — Ambulatory Visit (HOSPITAL_COMMUNITY): Payer: HMO

## 2018-09-05 ENCOUNTER — Ambulatory Visit (HOSPITAL_COMMUNITY): Payer: HMO

## 2018-09-08 ENCOUNTER — Ambulatory Visit (HOSPITAL_COMMUNITY): Payer: HMO

## 2018-09-10 ENCOUNTER — Ambulatory Visit (HOSPITAL_COMMUNITY): Payer: HMO

## 2018-09-12 ENCOUNTER — Ambulatory Visit (HOSPITAL_COMMUNITY): Payer: HMO

## 2018-09-15 ENCOUNTER — Ambulatory Visit (HOSPITAL_COMMUNITY): Payer: HMO

## 2018-09-17 ENCOUNTER — Ambulatory Visit (HOSPITAL_COMMUNITY): Payer: HMO

## 2018-09-19 ENCOUNTER — Ambulatory Visit (HOSPITAL_COMMUNITY): Payer: HMO

## 2018-09-19 ENCOUNTER — Telehealth: Payer: Self-pay | Admitting: Cardiovascular Disease

## 2018-09-19 NOTE — Telephone Encounter (Signed)
LMTCB to change visit to virtual. Also asked to review MyChart message that was sent to him yesterday.

## 2018-09-19 NOTE — Telephone Encounter (Signed)
Smartphone/ my chart/ consent/ pre reg completed °

## 2018-09-22 ENCOUNTER — Encounter: Payer: Self-pay | Admitting: Cardiovascular Disease

## 2018-09-22 ENCOUNTER — Ambulatory Visit (HOSPITAL_COMMUNITY): Payer: HMO

## 2018-09-22 ENCOUNTER — Telehealth (INDEPENDENT_AMBULATORY_CARE_PROVIDER_SITE_OTHER): Payer: HMO | Admitting: Cardiovascular Disease

## 2018-09-22 VITALS — BP 131/78 | HR 62 | Ht 67.0 in | Wt 175.0 lb

## 2018-09-22 DIAGNOSIS — I2510 Atherosclerotic heart disease of native coronary artery without angina pectoris: Secondary | ICD-10-CM | POA: Diagnosis not present

## 2018-09-22 DIAGNOSIS — E119 Type 2 diabetes mellitus without complications: Secondary | ICD-10-CM

## 2018-09-22 DIAGNOSIS — I10 Essential (primary) hypertension: Secondary | ICD-10-CM

## 2018-09-22 DIAGNOSIS — E785 Hyperlipidemia, unspecified: Secondary | ICD-10-CM

## 2018-09-22 DIAGNOSIS — Z951 Presence of aortocoronary bypass graft: Secondary | ICD-10-CM

## 2018-09-22 DIAGNOSIS — I480 Paroxysmal atrial fibrillation: Secondary | ICD-10-CM

## 2018-09-22 DIAGNOSIS — Z7901 Long term (current) use of anticoagulants: Secondary | ICD-10-CM

## 2018-09-22 MED ORDER — ATORVASTATIN CALCIUM 80 MG PO TABS: 80.0000 mg | ORAL_TABLET | Freq: Every day | ORAL | 1 refills | Status: DC

## 2018-09-22 NOTE — Patient Instructions (Signed)
Medication Instructions:  Increase Atorvastatin to 80 mg.  If you need a refill on your cardiac medications before your next appointment, please call your pharmacy.   Lab work: CMET, CBC, LIPID in 6 weeks (week of June 8th) If you have labs (blood work) drawn today and your tests are completely normal, you will receive your results only by: [name] MyChart Message (if you have MyChart) OR . A paper copy in the mail If you have any lab test that is abnormal or we need to change your treatment, we will call you to review the results.  Follow-Up: At [facility], you and your health needs are our priority.  As part of our continuing mission to provide you with exceptional heart care, we have created designated Provider Care Teams.  These Care Teams include your primary Cardiologist (physician) and Advanced Practice Providers (APPs -  Physician Assistants and Nurse Practitioners) who all work together to provide you with the care you need, when you need it. You will need a follow up appointment in 3 months. You may see Shelva Majestic, MD or one of the following Advanced Practice Providers on your designated Care Team: [place], [place] . Fabian Sharp, PA-C

## 2018-09-22 NOTE — Progress Notes (Signed)
Virtual Visit via Video Note   This visit type was conducted due to national recommendations for restrictions regarding the COVID-19 Pandemic (e.g. social distancing) in an effort to limit this patient's exposure and mitigate transmission in our community.  Due to his co-morbid illnesses, this patient is at least at moderate risk for complications without adequate follow up.  This format is felt to be most appropriate for this patient at this time.  All issues noted in this document were discussed and addressed.  A limited physical exam was performed with this format.  Please refer to the patient's chart for his consent to telehealth for [facility].   Evaluation Performed:  Follow-up visit  Date:  09/22/2018   ID:  Ryan Wagner, DOB 22-May-1946, MRN [id number]  Patient Location: Home Provider Location: Home  PCP:  Shirline Frees, MD  Cardiologist:  Shelva Majestic, MD  Electrophysiologist:  None   Chief Complaint:  Initial follow-up office with me following his CABG revascularization surgery  History of Present Illness:    Ryan Wagner is a 73 y.o. male has a history of CAD and suffered an inferior wall myocardial infarction on 11/21/2002 and 3 drug-eluting stents were placed in the RCA and at that time.He was also found to have  moderate to severe disease in the obtuse marginal system.  He underwent repeat cardiac catheterization by Dr. Rex Kras which revealed diffuse disease in the circumflex vessel not amenable to percutaneous coronary intervention which medical therapy was recommended.  A nuclear perfusion study was done in October 2007 showed fairly normal perfusion in all myocardial segments.  He initially had been on dual antiplatelet therapy, but his Plavix was stopped 2008.  When he last saw Dr. Rex Kras in January 2010 he was without recurrent anginal symptoms.  He is followed by Dr. Shirline Frees for primary medical care.  Additional problems include type 2 diabetes  mellitus, hyperlipidemia, hypertension, arthritis.  There is also a history of hiatal hernia with esophageal stricture.  He had undergone left knee replacement surgery.  He had seen Dr. [name] last in November 2016.  At that time he was on metoprolol, tartrate 50 Milgram's twice a day, Lotrel 10/20 daily for blood pressure control.  He was on atorvastatin 40 mg for hyperlipidemia.  He has been taking Victoza for his diabetes mellitus.  Laboratory done at that time revealed a hemoglobin of 15.6, hematocrit of 45.6.  Hemoglobin A1c was 5.9.  Lipid studies revealed a QTC 137, TG 1:30, HDL 32, LDL 78, and non-HDL 104.  He had normal renal function with a BUN of 12 and a creatinine of 0.81.  LFTs were normal.  TSH was normal at 0.90.    Since I last saw him, he has been seen by Rosalyn Gess, PA-C and Doreene Adas, PA.  He has an apple watch and this past summer he was to potential short periods of atrial fibrillation n which she was asymptomatic by his apple watch.  He wore an event monitor from August 21 through September 19.  The predominant rhythm was sinus rhythm with the fastest heart rate at 120 bpm.  These harborage heartbeat was 63 bpm.  He had rare PVCs with a burden of 3% and very rare PACs less than 1%.  There was one episode of nonsustained VT lasting 4 beats at a heart rate of 106.  He also was found to have several short-lived episodes of PAF with an AF burden of less than 1%.  His slowest heart  rate was in atrial fibrillation at 32 bpm.  He was subsequently seen by Doreene Adas all he was wearing the monitor.  He underwent an echo Doppler study in March 24, 2018 which showed normal systolic function with EF of 55 to 60%.  He did not have any regional wall motion abnormalities.  There was grade 1 diastolic dysfunction and mild dilation of his RV.  Over the past several months he states he has felt well but is watched detected an episode of AF on one occasion in November and then again in December which lasted  for approximately 2 days.  On May 11, 2018 he called the answering service and as noted in telephone message with [name], NP, he was asymptomatic without chest pain shortness of breath or palpitations but he reported atrial fibrillation rates in the 50-100 range.  He was started on Eliquis 5 mg twice a day and was instructed to stop his aspirin.  The patient feels well.  He denied chest pain.  During that evaluation, his ECG showed new inferior lateral T wave abnormalities suggestive of ischemia compared to an ECG in September 2019.  His Eliquis was held for 48 hours and he underwent cardiac catheterization on 06/10/2018 and was found to have diffuse significant multivessel disease as noted below..  The patient was seen by Dr. [name] and underwent bypass grafting x4 on June 11, 2018 with a LIMA to his LAD, sequential saphenous vein to his OM1 and OM 2, and saphenous vein graft to his PDA of his RCA.  He also underwent Maze procedure and atrial clip for his atrial fibrillation. He did well postop, he did have recurrent PAF the day prior to discharge and was kept an extra 24 hours and placed on amiodarone.   He was seen in the office 07/23/2018 and was doing well. He was in NSR.  He was hypertensive and his medications were adjusted (taken off ACE secondary to cough and placed on ARB).   His medications have also been adjusted twice by our pharmacist.  His losartan was increased to 100 mg daily and a few days later amlodipine 10 mg was added, he was previously on a combination of amlodipine and ACE inhibitor.    When last seen by Kerin Ransom, he was feeling well.  Blood pressures were stable at home.  He denied any chest pain or palpitations.  He is no longer on amiodarone.  He was supposed to start back rehab in late March, but unfortunately this was deferred due to the COVID-19 pandemic.  He is walking every day at home.  He walks up to 30 minutes.  He denies chest pain PND orthopnea.  When  he was discharged from the hospital he was discharged on atorvastatin 80 mg daily.  H a mileowever when he recently picked up his prescription apparently he was given his old 40 mg dose from a prior prescription.  He was wondering if he should take the 40 mg or resume the increased regimen.  He continues to be on Victoza and metformin for his diabetes mellitus.  The patient does not have symptoms concerning for COVID-19 infection (fever, chills, cough, or new shortness of breath).    Past Medical History:  Diagnosis Date  . Arthritis    knees, hands  . Atrial fibrillation ([place])   . CAD (coronary artery disease)    a. prior MI in 2004 with DESx3 to RCA  . Complication of anesthesia    had difficulty voiding  after hemorrhoidectomy  . Diabetes mellitus without complication ([place])    type 2  . GERD (gastroesophageal reflux disease)    no longer with symptoms  . Headache    migraines as a child  . Hyperlipidemia   . Hypertension   . Sciatica    improved   Past Surgical History:  Procedure Laterality Date  . ADENOIDECTOMY    . ANTERIOR CERVICAL DECOMP/DISCECTOMY FUSION N/A 04/15/2018   Procedure: Cervical Four-Five Cervical Five-Six Anterior cervical decompression/discectomy/fusion;  Surgeon: Judith Part, MD;  Location: [place];  Service: Neurosurgery;  Laterality: N/A;  anterior approach  . APPENDECTOMY    . BAND HEMORRHOIDECTOMY    . CARDIAC CATHETERIZATION    . CLIPPING OF ATRIAL APPENDAGE N/A 06/11/2018   Procedure: CLIPPING OF LEFT ATRIAL APPENDAGE using a 35 AtriCure Clip;  Surgeon: Grace Isaac, MD;  Location: [place];  Service: Open Heart Surgery;  Laterality: N/A;  . COLONOSCOPY    . CORONARY ANGIOPLASTY  2004  . CORONARY ARTERY BYPASS GRAFT N/A 06/11/2018   Procedure: CORONARY ARTERY BYPASS GRAFTING (CABG) x4 using left internal mammary artery and right greater saphenous vein harvested endoscopically. LIMA to distal LAD, Seq SVG to 1st & 2nd OM, SVG to distal PD;   Surgeon: Grace Isaac, MD;  Location: [place];  Service: Open Heart Surgery;  Laterality: N/A;  . ESOPHAGEAL DILATION    . KNEE SURGERY Left   . LEFT HEART CATH AND CORONARY ANGIOGRAPHY N/A 06/10/2018   Procedure: LEFT HEART CATH AND CORONARY ANGIOGRAPHY;  Surgeon: Troy Sine, MD;  Location: [place] CV [facility];  Service: Cardiovascular;  Laterality: N/A;  . LIPOMA EXCISION     neck  . MAZE N/A 06/11/2018   Procedure: Pulmonary isolation MAZE using bipolar ablation;  Surgeon: Grace Isaac, MD;  Location: [place];  Service: Open Heart Surgery;  Laterality: N/A;  . TEE WITHOUT CARDIOVERSION N/A 06/11/2018   Procedure: TRANSESOPHAGEAL ECHOCARDIOGRAM (TEE);  Surgeon: Grace Isaac, MD;  Location: [place];  Service: Open Heart Surgery;  Laterality: N/A;  . TONSILLECTOMY    . TOTAL KNEE ARTHROPLASTY Left 06/01/2014   Procedure: TOTAL KNEE ARTHROPLASTY;  Surgeon: Hessie Dibble, MD;  Location: [place];  Service: Orthopedics;  Laterality: Left;     Current Meds  Medication Sig  . acetaminophen (TYLENOL) 325 MG tablet Take 2 tablets (650 mg total) by mouth every 6 (six) hours as needed for mild pain.  [name] amLODipine (NORVASC) 10 MG tablet Take 1 tablet (10 mg total) by mouth daily.  [name] apixaban (ELIQUIS) 5 MG TABS tablet Take 1 tablet (5 mg total) by mouth 2 (two) times daily.  [name] aspirin EC 81 MG EC tablet Take 1 tablet (81 mg total) by mouth daily.  . Cinnamon 500 MG TABS Take 500 mg by mouth daily.  . Coenzyme Q10 (CO Q 10) 100 MG CAPS Take 100 mg by mouth daily.   [name] esomeprazole (NEXIUM) 20 MG capsule Take 20 mg by mouth daily.   [name] 500 MG CAPS Take 500 mg by mouth daily.  [name] L-Arginine 1000 MG TABS Take 1 tablet by mouth daily. L-Arginine 1000 mg / L-Citrulline   . Liraglutide (VICTOZA) 18 MG/3ML SOLN Inject 1.2 mg into the skin daily.   [name] losartan (COZAAR) 100 MG tablet Take 1 tablet (100 mg total) by mouth daily.  [name] MAGNESIUM GLUCONATE PO Take 150 mg by mouth daily.  .  metFORMIN (GLUCOPHAGE) 500 MG tablet Take 500  mg by mouth 2 (two) times daily with a meal.  . metoprolol tartrate (LOPRESSOR) 25 MG tablet Take 1 tablet (25 mg total) by mouth 2 (two) times daily.  . Multiple Vitamin (MULTIVITAMIN) tablet Take 1 tablet by mouth daily.  . Turmeric 500 MG CAPS Take 500 mg by mouth daily.  . [DISCONTINUED] atorvastatin (LIPITOR) 80 MG tablet Take 1 tablet (80 mg total) by mouth daily. (Patient taking differently: Take 80 mg by mouth daily. )     Allergies:   Ace inhibitors   Social History   Tobacco Use  . Smoking status: Never Smoker  . Smokeless tobacco: Never Used  Substance Use Topics  . Alcohol use: Yes    Comment: occasional  . Drug use: No     Family Hx: The patient's family history includes Dementia in his father and mother; High Cholesterol in his father; Thyroid disease in his mother.  ROS:   Please see the history of present illness.    He denies changes in vision or hearing.   He denies shortness of breath.   He denies recurrent chest pain.  His sternotomy site has healed well. He is unaware of abdominal complaints He denies leg swelling from the vein harvest site.   He had previously noted a tremor which has resolved He is unaware of any recurrent atrial fibrillation or palpitations  He is sleeping well All other systems reviewed and are negative.   Prior CV studies:   The following studies were reviewed today:   Prox RCA to Mid RCA lesion is 60% stenosed.  Mid RCA-1 lesion is 45% stenosed.  Mid RCA-2 lesion is 50% stenosed.  Mid RCA to Dist RCA lesion is 55% stenosed.  Dist RCA lesion is 80% stenosed.  Ost RPDA to RPDA lesion is 10% stenosed.  RPDA lesion is 80% stenosed.  Ost 1st Mrg to 1st Mrg lesion is 90% stenosed.  Mid Cx to Dist Cx lesion is 90% stenosed.  Ost Cx to Prox Cx lesion is 80% stenosed.  Ost 1st Diag lesion is 95% stenosed.  Mid LAD lesion is 80% stenosed.  Dist LAD lesion is 99%  stenosed.  The left ventricular ejection fraction is 45-50% by visual estimate.   There is evidence for significant cardiac calcification with ectatic vessels and multivessel CAD.  The LAD is calcified and has 95% diagonal 1 stenosis, diffuse 80% mid lady stenosis following an ectatic proximal segment and subtotal distal stenosis.  The left circumflex vessel is a very large caliber vessel with diffuse 80% ostial to proximal stenosis.  There is diffuse 90% stenoses in an OM vessel as well as distal OM/posterolateral like vessel.  The RCA is ectatic and has proximal and mid 60% stenoses.  There is diffuse intimal hyperplasia with 50 to 60% narrowing in tandem stents in the distal RCA and a small region of 80% stenosis proximal to the stent in the proximal PDA.  The mid PDA has diffuse 80% stenosis.  Mild LV dysfunction with an EF of approximately 45% with focal apical akinesis.  There is mild mid anterolateral and basal posterior hypocontractility.  RECOMMENDATION: Surgical consultation for consideration of CABG revascularization surgery.  At present, reinstitution of Eliquis will be held.  The proximal circumflex is extremely large in caliber and larger than any stents which are available in lab.  ------------------------------------------------------------------- ECHO Study Conclusions: 03/24/2018  - Left ventricle: The cavity size was normal. Wall thickness was   normal. Systolic function was normal. The estimated ejection  fraction was in the range of 55% to 60%. Wall motion was normal;   there were no regional wall motion abnormalities. Doppler   parameters are consistent with abnormal left ventricular   relaxation (grade 1 diastolic dysfunction). - Right ventricle: The cavity size was mildly dilated.  Impressions:  - Normal LV systolic function; mild diastolic dysfunction; mild   RVE.  Labs/Other Tests and Data Reviewed:    EKG:  An ECG dated 07/23/2018 was personally  reviewed today and demonstrated:  Normal sinus rhythm at 60 bpm.  Mild AV block.  Mild T wave abnormality inferolaterally, improved from his prior tracing with me which had previously shown more deeply abnormal T waves inferiorly and intervals laterally  Recent Labs: 06/05/2018: TSH 0.997 06/10/2018: ALT 26 06/12/2018: Magnesium 2.3 06/14/2018: Hemoglobin 10.2; Platelets 162 08/13/2018: BUN 12; Creatinine, Ser 0.87; Potassium 4.3; Sodium 139   Recent Lipid Panel No results found for: CHOL, TRIG, HDL, CHOLHDL, LDLCALC, LDLDIRECT  Wt Readings from Last 3 Encounters:  09/22/18 175 lb (79.4 kg)  08/13/18 181 lb 12.8 oz (82.5 kg)  07/23/18 185 lb 3.2 oz (84 kg)     Objective:    Vital Signs:  BP 131/78   Pulse 62   Ht '5\' 7"'$  (1.702 m)   Wt 175 lb (79.4 kg)   BMI 27.41 kg/m    He is well-developed and well-nourished in no acute distress. Respirations are normal and not labored HEENT is unremarkable. There is no neck vein distention. There is no audible wheezing and he is not short of breath I had him open up his shirt and his sternotomy site appears well-healed.  There is no significant erythema His abdomen is nontender There is no's leg swelling Neurologically he appears intact He has normal cognition and affect  ASSESSMENT & PLAN:    1. CAD/CABG revascularization surgery: Remote history of suffering an inferior wall myocardial infarction in June 2004 at which time 3 drug-eluting stents were placed to the RCA.  Subsequent catheterization had shown diffuse disease in the circumflex not amenable to PCI.  Cath data were again reviewed with him in detail which necessitated his recent CABG revascularization done the following day by Dr. [name].  I reviewed his graft insertions.  Clinically he is doing well.  He is not having recurrent anginal symptoms.  He is not short of breath.  He is to participate in cardiac rehab but this has been deferred and he is now exercising daily on his own  walking at least a mile a day. 2. PAF: At the time of CABG surgery, he underwent successful Maze procedure and atrial clip.  Prior to his bypass surgery the patient had never experienced chest tightness but had been noted to be in atrial fibrillation for which he was started on Eliquis. 3. Eliquis anticoagulation: At present we will continue Eliquis post surgery.  He is maintaining sinus rhythm. 4. Essential hypertension: Blood pressure today is well controlled on losartan 100 mg daily, Lopressor 25 mg twice a day, amlodipine 10 mg daily. 5. Hyperlipidemia: He was found to have significant progressive diffuse CAD despite being on atorvastatin 40 mg prior to his bypass surgery.  His dose was increased to 80 mg.  We will continue at the high dose with plans for follow-up lipid panel in 6 weeks with additional comprehensive metabolic panel 6. Diabetes mellitus, currently on Victoza and metformin, tolerating well. 7.   COVID-19 Education: The signs and symptoms of COVID-19 were discussed with the patient and how  to seek care for testing (follow up with PCP or arrange E-visit).  The importance of social distancing was discussed today.  Time:   Today, I have spent 25 minutes with the patient with telehealth technology discussing the above problems.     Medication Adjustments/Labs and Tests Ordered: Current medicines are reviewed at length with the patient today.  Concerns regarding medicines are outlined above.   Tests Ordered: No orders of the defined types were placed in this encounter.   Medication Changes: No orders of the defined types were placed in this encounter.   Disposition:  Follow up In 6 weeks with lipid panel and chemistry profile with plans office visit in 3 months  Signed, Shelva Majestic, MD  09/22/2018 10:53 AM    [place]

## 2018-09-23 ENCOUNTER — Telehealth (HOSPITAL_COMMUNITY): Payer: Self-pay | Admitting: *Deleted

## 2018-09-24 ENCOUNTER — Ambulatory Visit (HOSPITAL_COMMUNITY): Payer: HMO

## 2018-09-26 ENCOUNTER — Ambulatory Visit (HOSPITAL_COMMUNITY): Payer: HMO

## 2018-09-29 ENCOUNTER — Ambulatory Visit (HOSPITAL_COMMUNITY): Payer: HMO

## 2018-10-01 ENCOUNTER — Ambulatory Visit (HOSPITAL_COMMUNITY): Payer: HMO

## 2018-10-03 ENCOUNTER — Ambulatory Visit (HOSPITAL_COMMUNITY): Payer: HMO

## 2018-10-06 ENCOUNTER — Ambulatory Visit (HOSPITAL_COMMUNITY): Payer: HMO

## 2018-10-08 ENCOUNTER — Ambulatory Visit (HOSPITAL_COMMUNITY): Payer: HMO

## 2018-10-08 DIAGNOSIS — K219 Gastro-esophageal reflux disease without esophagitis: Secondary | ICD-10-CM | POA: Diagnosis not present

## 2018-10-08 DIAGNOSIS — Z7984 Long term (current) use of oral hypoglycemic drugs: Secondary | ICD-10-CM | POA: Diagnosis not present

## 2018-10-08 DIAGNOSIS — I480 Paroxysmal atrial fibrillation: Secondary | ICD-10-CM | POA: Diagnosis not present

## 2018-10-08 DIAGNOSIS — I2510 Atherosclerotic heart disease of native coronary artery without angina pectoris: Secondary | ICD-10-CM | POA: Diagnosis not present

## 2018-10-08 DIAGNOSIS — E119 Type 2 diabetes mellitus without complications: Secondary | ICD-10-CM | POA: Diagnosis not present

## 2018-10-08 DIAGNOSIS — I10 Essential (primary) hypertension: Secondary | ICD-10-CM | POA: Diagnosis not present

## 2018-10-08 DIAGNOSIS — E782 Mixed hyperlipidemia: Secondary | ICD-10-CM | POA: Diagnosis not present

## 2018-10-10 ENCOUNTER — Ambulatory Visit (HOSPITAL_COMMUNITY): Payer: HMO

## 2018-10-13 ENCOUNTER — Ambulatory Visit: Payer: Self-pay

## 2018-10-13 ENCOUNTER — Ambulatory Visit (HOSPITAL_COMMUNITY): Payer: HMO

## 2018-10-15 ENCOUNTER — Ambulatory Visit (HOSPITAL_COMMUNITY): Payer: HMO

## 2018-10-17 ENCOUNTER — Ambulatory Visit (HOSPITAL_COMMUNITY): Payer: HMO

## 2018-10-22 ENCOUNTER — Ambulatory Visit (HOSPITAL_COMMUNITY): Payer: HMO

## 2018-10-24 ENCOUNTER — Ambulatory Visit (HOSPITAL_COMMUNITY): Payer: HMO

## 2018-10-24 ENCOUNTER — Ambulatory Visit: Payer: Self-pay

## 2018-10-27 ENCOUNTER — Ambulatory Visit (HOSPITAL_COMMUNITY): Payer: HMO

## 2018-10-28 ENCOUNTER — Telehealth (HOSPITAL_COMMUNITY): Payer: Self-pay

## 2018-10-28 ENCOUNTER — Encounter (HOSPITAL_COMMUNITY)
Admission: RE | Admit: 2018-10-28 | Discharge: 2018-10-28 | Disposition: A | Discharge status: Home / Self Care | Payer: Self-pay | Source: Ambulatory Visit | Attending: Cardiovascular Disease | Admitting: Cardiovascular Disease

## 2018-10-28 NOTE — Progress Notes (Signed)
The following notification sent by in basket message: Dr.  Claiborne Billings,   As you are aware our department remains closed to patients due to Covid-19.  We are excited to be able to offer an alternative to traditional onsite Cardiac Rehab while your patient continues to follow Re-Open guidelines.  This is a notification that your patient has been contacted and is very interested in participating in Virtual Cardiac Rehab.  Thank you for your continued support in helping [place] meet the health care needs of our patients.  Cherre Huger, BSN Cardiac and Engineer, petroleum

## 2018-10-28 NOTE — Telephone Encounter (Signed)
° °        Confirm Consent - In the setting of the current Covid19 crisis, you are scheduled for a phone visit with your Cardiac or Pulmonary team member.  Just as we do with many in-gym visits, in order for you to participate in this visit, we must obtain consent.  If you'd like, I can send this to your mychart (if signed up) or email for you to review.  Otherwise, I can obtain your verbal consent now.  By agreeing to a telephone visit, we'd like you to understand that the technology does not allow for your Cardiac or Pulmonary Rehab team member to perform a physical assessment, and thus may limit their ability to fully assess your ability to perform exercise programs. If your provider identifies any concerns that need to be evaluated in person, we will make arrangements to do so.  Finally, though the technology is pretty good, we cannot assure that it will always work on either your or our end and we cannot ensure that we have a secure connection.  Cardiac and Pulmonary Rehab Telehealth visits and At Home cardiac and pulmonary rehab are provided at no cost to you.               Are you willing to proceed?" STAFF: Did the patient verbally acknowledge consent to telehealth visit? Document YES/NO here: Yes       Jessica C.   Cardiac and Pulmonary Rehab Staff   D6/06/2018 T3:21PM

## 2018-10-28 NOTE — Progress Notes (Signed)
Called and spoke to pt regarding Virtual Cardiac Rehab.  Pt  was able to download the [employer] app on their smart device with no issues. Pt set up their account and received the following welcome message -"Welcome to the [place] [facility] program. We hope that you will find the exercise program beneficial in your recovery process. Our staff is available to assist with in questions/concerns about your exercise routine. Best wishes". Brief orientation provided to with the advisement to watch the "Intro to Rehab" series located under the Resource tab. Pt verbalized understanding. Will continue to follow and monitor pt progress with feedback as needed. 

## 2018-10-29 ENCOUNTER — Ambulatory Visit (HOSPITAL_COMMUNITY): Payer: HMO

## 2018-10-31 ENCOUNTER — Ambulatory Visit (HOSPITAL_COMMUNITY): Payer: HMO

## 2018-11-03 ENCOUNTER — Ambulatory Visit (HOSPITAL_COMMUNITY): Payer: HMO

## 2018-11-05 ENCOUNTER — Ambulatory Visit (HOSPITAL_COMMUNITY): Payer: HMO

## 2018-11-05 ENCOUNTER — Other Ambulatory Visit: Payer: Self-pay

## 2018-11-05 NOTE — Patient Outreach (Signed)
  [place] [facility]) Care Management Chronic Special Needs Program    11/05/2018  Name: Ryan Wagner, DOB: [birth year]  MRN: [id number]   [name] is enrolled in a chronic special needs plan. RNCM called to review health risk assessment and care plan with client. No answer. HIPAA compliant message left on both mobile and home phone lines.  Plan: RNCM will follow up within the next 1-2 weeks.  Thea Silversmith, RN, MSN, [place] [phone]

## 2018-11-06 ENCOUNTER — Other Ambulatory Visit: Payer: Self-pay

## 2018-11-06 NOTE — Patient Outreach (Addendum)
  [facility]) Care Management Chronic Special Needs Program  11/06/2018  Name: Ryan Wagner DOB: 03-11-1946  MRN: [id number]  Ryan Wagner is enrolled in a chronic special needs plan for Diabetes. Chronic Care Management Coordinator telephoned client to review health risk assessment and to develop individualized care plan.  Introduced the chronic care management program, importance of client participation, and taking their care plan to all provider appointments and inpatient facilities.   Subjective: client reports a history of diabetes, hypertension, heart disease and atrial fibrillation. He states his A1C is 5.5. Client reports he has a blood pressure cuff and monitors his blood pressure. He denies issues with blood pressure readings.[name] He is checking his blood sugar at least twice a day and reports his blood sugars are controlled. He also reports consistent exercise routine of walking at least a mile a day. Client reports his only issue is that he has reached the "donut hole". He is receptive to pharmacy referral.  Goals Addressed            This Visit's Progress   . COMPLETED: Client understands the importance of follow-up with providers by attending scheduled visits       Client verbalized understanding and states he attends all scheduled appointments.    . Client will report no worsening of symptoms of Atrial Fibrillation within the next 6-9 months   On track   . Client will report no worsening of symptoms related to heart disease within the next 6-9 months    On track   . COMPLETED: Client will use Assistive Devices as needed and verbalize understanding of device use       Denies any questions regarding use of glucometer.    [name] HEMOGLOBIN A1C < 7.0       Diabetes self management actions: Glucose monitoring per provider recommendations Eat Healthy Check feet daily Visit provider every 3-6 months as directed Hbg A1C level every 3-6 months. Eye Exam  yearly    . Maintain timely refills of diabetic medication as prescribed within the year .   On track   . Obtain Hemoglobin A1C at least 2 times per year       Done 08/13/2018 result 5.5      Covid 19 precautions discussed. RNCM reinforced the 24 hour nurse advice line. RNCM encouraged client to contact health care concierge for benefit questions. Client encouraged to call RNCM as needed/Confirmed he has contact number.  Plan:  Send successful outreach letter with a copy of their individualized care plan and Send individual care plan to provider Chronic care management coordination will outreach in: 6 months. Will refer client to:  Pharmacy  Thea Silversmith, RN, MSN, [place] [phone]

## 2018-11-07 ENCOUNTER — Ambulatory Visit (HOSPITAL_COMMUNITY): Payer: HMO

## 2018-11-10 ENCOUNTER — Ambulatory Visit (HOSPITAL_COMMUNITY): Payer: HMO

## 2018-11-10 ENCOUNTER — Other Ambulatory Visit: Payer: Self-pay | Admitting: Pharmacist

## 2018-11-10 NOTE — Patient Outreach (Signed)
[facility]) Care Management  [place]   11/10/2018  Ryan Wagner Apr 21, 1946 [id number]  Reason for referral: Medication Assistance  Referral source: [employer] Care Manager with [facility] Current insurance: [employer]  PMHx includes but not limited to:  T2DM, CAD s/p RCA DES x3 '04, CABGx4 1/'20, PAF on chronic anticoagulation, left atrial clip / MAZE during CABG, HTN, HLD, osteoarthritis of left knee, s/p TKA '16, cervical myelopathy s/p C-spine surgery '19  Outreach:  Unsuccessful telephone call attempt #1 to patient. HIPAA compliant voicemail left requesting a return call  Plan:  -I will mail patient an unsuccessful outreach letter.  -I will make another outreach attempt to patient within 3-4 business days.    Ralene Bathe, PharmD, [place] [phone]

## 2018-11-11 ENCOUNTER — Other Ambulatory Visit: Payer: Self-pay | Admitting: Pharmacist

## 2018-11-11 NOTE — Patient Outreach (Signed)
[facility]) Care Management  [place]   11/11/2018  SEKAI GITLIN 07-Nov-1945 [id number]  Reason for referral: Medication Assistance  Referral source: [employer] Care Manager with [facility] Current insurance: [employer] C-[employer]  PMHx includes but not limited to:  T2DM, CAD s/p RCA DES x3 '04, CABGx4 1/'20, PAF on chronic anticoagulation, left atrial clip / MAZE during CABG, HTN, HLD, osteoarthritis of left knee, s/p TKA '16, cervical myelopathy s/p C-spine surgery '19  Outreach:  Incoming call and voicemail received form patient.  Return call placed today.   Successful telephone call with Mr. Seder.  HIPAA identifiers verified.   Subjective:  Patient reports he is already now in the coverage gap with HTA and co-pays for Victoza and Eliquis are very expensive.  He is wondering if there are any patient assistance options for him to reduce cost.    Patient states he has not had any episodes of atrial fibrillation since having the MAZE procedure in January.  Reports that he may be able to stop Eliquis next year if he continues to not have any further atrial fibrillation.  He reports not having any issues with bleeding on Eliquis.  Patient reports he initially started on Victoza through a [employer] study for weight lost which helped him lose 25 pounds.  His provider continued him on it after study ended for diabetic management.  He reports he does not mind having to take daily injections vs weekly injections.    Objective: The ASCVD Risk score Mikey Bussing DC Jr., et al., 2013) failed to calculate for the following reasons:   The patient has a prior MI or stroke diagnosis  Lab Results  Component Value Date   CREATININE 0.87 08/13/2018   CREATININE 1.16 06/15/2018   CREATININE 1.24 06/14/2018    Lab Results  Component Value Date   HGBA1C 5.5 08/13/2018    Lipid Panel  No results found for: CHOL, TRIG, HDL, CHOLHDL, VLDL, LDLCALC,  LDLDIRECT  BP Readings from Last 3 Encounters:  09/22/18 131/78  08/13/18 (!) 138/58  07/23/18 (!) 200/110    Allergies  Allergen Reactions  . Ace Inhibitors Cough    Medications Reviewed Today    Reviewed by Luretha Rued, RN (Registered Nurse) on 11/06/18 at 1157  Med List Status: <None>  Medication Order Taking? Sig Documenting Provider Last Dose Status Informant  acetaminophen (TYLENOL) 325 MG tablet [id number] No Take 2 tablets (650 mg total) by mouth every 6 (six) hours as needed for mild pain.  Patient not taking: Reported on 11/06/2018   John Giovanni, PA-C Not Taking Active   amiodarone (PACERONE) 200 MG tablet [id number] No Take 1 tablet (200 mg total) by mouth daily.  Patient not taking: Reported on 09/22/2018   Elgie Collard, PA-C Not Taking Active   amLODipine (NORVASC) 10 MG tablet [id number]  Take 1 tablet (10 mg total) by mouth daily. Troy Sine, MD  Expired 11/02/18 2359   apixaban (ELIQUIS) 5 MG TABS tablet [id number] Yes Take 1 tablet (5 mg total) by mouth 2 (two) times daily. [name] D, NP Taking Active Self  aspirin EC 81 MG EC tablet [id number] Yes Take 1 tablet (81 mg total) by mouth daily. John Giovanni, PA-C Taking Active   atorvastatin (LIPITOR) 80 MG tablet [id number] Yes Take 1 tablet (80 mg total) by mouth daily. Troy Sine, MD Taking Active   Cinnamon 500 MG TABS [id number] No Take 500 mg by mouth  daily. [provider] Not Taking Active Self  Coenzyme Q10 (CO Q 10) 100 MG CAPS [id number] Yes Take 100 mg by mouth daily.  [provider] Taking Active Self  esomeprazole (NEXIUM) 20 MG capsule [id number] Yes Take 20 mg by mouth daily.  [provider] Taking Active Self  Krill Oil 500 MG CAPS [id number] Yes Take 500 mg by mouth daily. [provider] Taking Active Self  L-Arginine 1000 MG TABS [id number] Yes Take 1 tablet by mouth daily. L-Arginine 1000 mg / L-Citrulline  [provider] Taking Active Self   Liraglutide (VICTOZA) 18 MG/3ML SOLN [id number] Yes Inject 1.2 mg into the skin daily.  [provider] Taking Active Self           Med Note Stan Head, [place] Apr 07, 2018 10:42 AM) Pt takes in the evening with dinner  losartan (COZAAR) 100 MG tablet [id number] Yes Take 1 tablet (100 mg total) by mouth daily. Troy Sine, MD Taking Active   MAGNESIUM GLUCONATE PO [id number] Yes Take 150 mg by mouth daily. [provider] Taking Active Self  metFORMIN (GLUCOPHAGE) 500 MG tablet [id number] Yes Take 500 mg by mouth 2 (two) times daily with a meal. [provider] Taking Active Self           Med Note Mel Almond, Ebbie Latus   Tue Jun 10, 2018  5:48 AM) Instructed to hold 48 hours after procedure   metoprolol tartrate (LOPRESSOR) 25 MG tablet [id number] Yes Take 1 tablet (25 mg total) by mouth 2 (two) times daily. Erlene Quan, PA-C Taking Active   Multiple Vitamin (MULTIVITAMIN) tablet [id number] Yes Take 1 tablet by mouth daily. [provider] Taking Active Self  Turmeric 500 MG CAPS [id number] Yes Take 500 mg by mouth daily. [provider] Taking Active Self          Assessment: Drugs sorted by system:  Hematologic: apixaban, aspirin '81mg'$   Cardiovascular: amlodipine, metoprolol, losartan, atorvastatin  Gastrointestinal: esomeprazole  Endocrine: liraglutide, metformin  Pain: acetaminophen  Vitamins/Minerals/Supplements: CoQ10, Krill oil, L-Arginine, magnesium, MVI, turmeric  Medication Review Findings:  . Removed amiodarone and cinnamon from medication list as patient no longer taking . Reviewed possible alternatives to daily injection of GLP-1 RA.  Patient may discuss with provider in the future.    Medication Adherence Findings: Adherence Review  '[x]'$  Excellent (no doses missed/week)     '[]'$  Good (no more than 1 dose missed/week)     '[]'$  Partial (2-3 doses missed/week) '[]'$  Poor (>3 doses missed/week)  Patient with excellent  understanding of regimen and excellent understanding of indications.    Medication Assistance Findings:  Medication assistance needs identified: Victoza and Eliquis  Extra Help:  Not eligible for Extra Help Low Income Subsidy based on reported income and assets  Patient Assistance Programs: Victoza made by [place] requirement met: No o Out-of-pocket prescription expenditure met:   Not Applicable - Patient has not met application requirements to apply for this patient assistance program at this time.   Eliquis made by BMS o Income requirement met: No o Out-of-pocket prescription expenditure met:   No (3% household income) - Patient has not met application requirements to apply for this patient assistance program at this time.     Plan: . Will close [facility] pharmacy case as no further medication needs identified at this time.  Am happy to assist in the future as needed.     Ralene Bathe, PharmD, BCPS  [place] [phone]

## 2018-11-12 ENCOUNTER — Ambulatory Visit (HOSPITAL_COMMUNITY): Payer: HMO

## 2018-11-13 ENCOUNTER — Ambulatory Visit: Payer: HMO | Admitting: Pharmacist

## 2018-11-13 ENCOUNTER — Ambulatory Visit: Payer: Self-pay

## 2018-11-14 ENCOUNTER — Ambulatory Visit (HOSPITAL_COMMUNITY): Payer: HMO

## 2018-11-14 ENCOUNTER — Telehealth (HOSPITAL_COMMUNITY): Payer: Self-pay

## 2018-11-14 NOTE — Telephone Encounter (Signed)
Phone call to Pt to inquire about logging exercise in the virtual CR app. Pt did not answer. Pt has not been logging exercise in the app. Message was left for Pt to return call.

## 2018-11-17 ENCOUNTER — Ambulatory Visit (HOSPITAL_COMMUNITY): Payer: HMO

## 2018-11-19 ENCOUNTER — Ambulatory Visit (HOSPITAL_COMMUNITY): Payer: HMO

## 2018-12-01 ENCOUNTER — Other Ambulatory Visit: Payer: Self-pay

## 2018-12-01 MED ORDER — METOPROLOL TARTRATE 25 MG PO TABS: 25.0000 mg | ORAL_TABLET | Freq: Two times a day (BID) | ORAL | 2 refills | Status: DC

## 2018-12-03 DIAGNOSIS — I480 Paroxysmal atrial fibrillation: Secondary | ICD-10-CM | POA: Diagnosis not present

## 2018-12-03 DIAGNOSIS — I10 Essential (primary) hypertension: Secondary | ICD-10-CM | POA: Diagnosis not present

## 2018-12-03 DIAGNOSIS — I2510 Atherosclerotic heart disease of native coronary artery without angina pectoris: Secondary | ICD-10-CM | POA: Diagnosis not present

## 2018-12-03 DIAGNOSIS — Z7984 Long term (current) use of oral hypoglycemic drugs: Secondary | ICD-10-CM | POA: Diagnosis not present

## 2018-12-03 DIAGNOSIS — E119 Type 2 diabetes mellitus without complications: Secondary | ICD-10-CM | POA: Diagnosis not present

## 2018-12-03 DIAGNOSIS — M179 Osteoarthritis of knee, unspecified: Secondary | ICD-10-CM | POA: Diagnosis not present

## 2018-12-03 DIAGNOSIS — E782 Mixed hyperlipidemia: Secondary | ICD-10-CM | POA: Diagnosis not present

## 2018-12-22 ENCOUNTER — Other Ambulatory Visit: Payer: Self-pay | Admitting: Cardiology

## 2018-12-22 DIAGNOSIS — H401130 Primary open-angle glaucoma, bilateral, stage unspecified: Secondary | ICD-10-CM | POA: Diagnosis not present

## 2019-01-08 ENCOUNTER — Other Ambulatory Visit: Payer: Self-pay | Admitting: Cardiology

## 2019-01-20 DIAGNOSIS — H401121 Primary open-angle glaucoma, left eye, mild stage: Secondary | ICD-10-CM | POA: Diagnosis not present

## 2019-01-20 DIAGNOSIS — H401112 Primary open-angle glaucoma, right eye, moderate stage: Secondary | ICD-10-CM | POA: Diagnosis not present

## 2019-01-26 DIAGNOSIS — I480 Paroxysmal atrial fibrillation: Secondary | ICD-10-CM | POA: Diagnosis not present

## 2019-01-26 DIAGNOSIS — E119 Type 2 diabetes mellitus without complications: Secondary | ICD-10-CM | POA: Diagnosis not present

## 2019-01-26 DIAGNOSIS — M179 Osteoarthritis of knee, unspecified: Secondary | ICD-10-CM | POA: Diagnosis not present

## 2019-01-26 DIAGNOSIS — I10 Essential (primary) hypertension: Secondary | ICD-10-CM | POA: Diagnosis not present

## 2019-01-26 DIAGNOSIS — E782 Mixed hyperlipidemia: Secondary | ICD-10-CM | POA: Diagnosis not present

## 2019-01-26 DIAGNOSIS — Z7984 Long term (current) use of oral hypoglycemic drugs: Secondary | ICD-10-CM | POA: Diagnosis not present

## 2019-01-26 DIAGNOSIS — I2510 Atherosclerotic heart disease of native coronary artery without angina pectoris: Secondary | ICD-10-CM | POA: Diagnosis not present

## 2019-02-23 ENCOUNTER — Other Ambulatory Visit: Payer: Self-pay | Admitting: Cardiovascular Disease

## 2019-02-23 ENCOUNTER — Other Ambulatory Visit: Payer: Self-pay | Admitting: Cardiology

## 2019-02-25 DIAGNOSIS — I480 Paroxysmal atrial fibrillation: Secondary | ICD-10-CM | POA: Diagnosis not present

## 2019-02-25 DIAGNOSIS — E782 Mixed hyperlipidemia: Secondary | ICD-10-CM | POA: Diagnosis not present

## 2019-02-25 DIAGNOSIS — E119 Type 2 diabetes mellitus without complications: Secondary | ICD-10-CM | POA: Diagnosis not present

## 2019-02-25 DIAGNOSIS — I10 Essential (primary) hypertension: Secondary | ICD-10-CM | POA: Diagnosis not present

## 2019-02-25 DIAGNOSIS — I2510 Atherosclerotic heart disease of native coronary artery without angina pectoris: Secondary | ICD-10-CM | POA: Diagnosis not present

## 2019-02-25 DIAGNOSIS — Z7984 Long term (current) use of oral hypoglycemic drugs: Secondary | ICD-10-CM | POA: Diagnosis not present

## 2019-02-25 DIAGNOSIS — M179 Osteoarthritis of knee, unspecified: Secondary | ICD-10-CM | POA: Diagnosis not present

## 2019-03-24 DIAGNOSIS — I10 Essential (primary) hypertension: Secondary | ICD-10-CM | POA: Diagnosis not present

## 2019-03-24 DIAGNOSIS — I2510 Atherosclerotic heart disease of native coronary artery without angina pectoris: Secondary | ICD-10-CM | POA: Diagnosis not present

## 2019-03-24 DIAGNOSIS — M5441 Lumbago with sciatica, right side: Secondary | ICD-10-CM | POA: Diagnosis not present

## 2019-03-24 DIAGNOSIS — E119 Type 2 diabetes mellitus without complications: Secondary | ICD-10-CM | POA: Diagnosis not present

## 2019-03-24 DIAGNOSIS — E782 Mixed hyperlipidemia: Secondary | ICD-10-CM | POA: Diagnosis not present

## 2019-03-30 ENCOUNTER — Other Ambulatory Visit: Payer: Self-pay | Admitting: Cardiovascular Disease

## 2019-03-31 DIAGNOSIS — I10 Essential (primary) hypertension: Secondary | ICD-10-CM | POA: Diagnosis not present

## 2019-03-31 DIAGNOSIS — E119 Type 2 diabetes mellitus without complications: Secondary | ICD-10-CM | POA: Diagnosis not present

## 2019-03-31 DIAGNOSIS — Z7984 Long term (current) use of oral hypoglycemic drugs: Secondary | ICD-10-CM | POA: Diagnosis not present

## 2019-03-31 DIAGNOSIS — E782 Mixed hyperlipidemia: Secondary | ICD-10-CM | POA: Diagnosis not present

## 2019-03-31 NOTE — Telephone Encounter (Signed)
73 F 82.5 kg, SCr 0.87 (3/20), LOV Claiborne Billings 4/20

## 2019-04-03 DIAGNOSIS — M5416 Radiculopathy, lumbar region: Secondary | ICD-10-CM | POA: Diagnosis not present

## 2019-04-06 DIAGNOSIS — M6281 Muscle weakness (generalized): Secondary | ICD-10-CM | POA: Diagnosis not present

## 2019-04-06 DIAGNOSIS — M5431 Sciatica, right side: Secondary | ICD-10-CM | POA: Diagnosis not present

## 2019-04-06 DIAGNOSIS — M5416 Radiculopathy, lumbar region: Secondary | ICD-10-CM | POA: Diagnosis not present

## 2019-04-15 ENCOUNTER — Other Ambulatory Visit: Payer: Self-pay | Admitting: Cardiology

## 2019-04-16 DIAGNOSIS — I2510 Atherosclerotic heart disease of native coronary artery without angina pectoris: Secondary | ICD-10-CM | POA: Diagnosis not present

## 2019-04-16 DIAGNOSIS — M179 Osteoarthritis of knee, unspecified: Secondary | ICD-10-CM | POA: Diagnosis not present

## 2019-04-16 DIAGNOSIS — E119 Type 2 diabetes mellitus without complications: Secondary | ICD-10-CM | POA: Diagnosis not present

## 2019-04-16 DIAGNOSIS — I480 Paroxysmal atrial fibrillation: Secondary | ICD-10-CM | POA: Diagnosis not present

## 2019-04-16 DIAGNOSIS — E782 Mixed hyperlipidemia: Secondary | ICD-10-CM | POA: Diagnosis not present

## 2019-04-16 DIAGNOSIS — I10 Essential (primary) hypertension: Secondary | ICD-10-CM | POA: Diagnosis not present

## 2019-04-20 DIAGNOSIS — H401112 Primary open-angle glaucoma, right eye, moderate stage: Secondary | ICD-10-CM | POA: Diagnosis not present

## 2019-04-28 DIAGNOSIS — M545 Low back pain: Secondary | ICD-10-CM | POA: Diagnosis not present

## 2019-04-28 DIAGNOSIS — M5441 Lumbago with sciatica, right side: Secondary | ICD-10-CM | POA: Diagnosis not present

## 2019-04-29 ENCOUNTER — Other Ambulatory Visit: Payer: Self-pay

## 2019-04-29 NOTE — Patient Outreach (Signed)
  [facility]) Care Management Chronic Special Needs Program  04/29/2019  Name: Ryan Wagner DOB: 1946-01-13  MRN: [id number]  Ryan Wagner is enrolled in a chronic special needs plan for Diabetes. Reviewed and updated care plan.  Subjective: client reports following up with providers as scheduled. He reports a cardiology visit scheduled for tomorrow and he had an appointment with primary care last month. Client reports pinched nerve in his lower back. Client reports is currently controlled with medication and being followed by orthopedic provider. Client reports A1C 5.8 on 03/31/2019. He states he is very excited about that. Client reports he has not checked his blood sugar this morning and acknowledges that he does not check his blood sugar every day. Client however reports he can tell when he blood sugar is low. He denies any questions or concerns regarding his blood sugar.    Goals Addressed            This Visit's Progress   . COMPLETED: Client will report no worsening of symptoms of Atrial Fibrillation within the next 6-9 months       Denies any worsening symptoms.    . COMPLETED: Client will report no worsening of symptoms related to heart disease within the next 6-9 months        Denies any worsening symptoms    . COMPLETED: Client will verbalize knowledge of self management of Hypertension as evidences by BP reading of 140/90 or less; or as defined by provider       Reports takes medications as scheduled, attends provider visits as scheduled, blood pressure less than 140/90.    [name] COMPLETED: Maintain timely refills of diabetic medication as prescribed within the year .       Denies any issues with obtaining medications.    . COMPLETED: Obtain annual  Lipid Profile, LDL-C       Done 03/31/2019    . COMPLETED: Obtain Annual Eye (retinal)  Exam        Done 06/23/2018    . Obtain Annual Foot Exam   On track   . Obtain annual screen for micro  albuminuria (urine) , nephropathy (kidney problems)   On track   . COMPLETED: Obtain Hemoglobin A1C at least 2 times per year       Done 08/13/2018 result 5.5 and 03/31/2019 5.8    . COMPLETED: Visit Primary Care Provider or Endocrinologist at least 2 times per year        Has seen provider at least twice this year.      Covid 19 precautions discussed. RNCM encouraged client to call the 24 hour nurse advice line as needed. Client reinforced the healthcare concierge is available for benefits questions.  RNCM also encouraged client to call RNCM as needed.   Plan: RNCM will send updated care plan to client, send updated care plan to primary care. RNCM will follow up per tier level.     Thea Silversmith, RN, MSN, [place] [phone]    .

## 2019-04-30 ENCOUNTER — Ambulatory Visit (INDEPENDENT_AMBULATORY_CARE_PROVIDER_SITE_OTHER): Payer: HMO | Admitting: Cardiovascular Disease

## 2019-04-30 ENCOUNTER — Other Ambulatory Visit: Payer: Self-pay

## 2019-04-30 ENCOUNTER — Encounter: Payer: Self-pay | Admitting: Cardiovascular Disease

## 2019-04-30 VITALS — BP 136/68 | HR 47 | Temp 97.3°F | Ht 67.0 in | Wt 193.0 lb

## 2019-04-30 DIAGNOSIS — I2510 Atherosclerotic heart disease of native coronary artery without angina pectoris: Secondary | ICD-10-CM

## 2019-04-30 DIAGNOSIS — Z7901 Long term (current) use of anticoagulants: Secondary | ICD-10-CM | POA: Diagnosis not present

## 2019-04-30 DIAGNOSIS — I480 Paroxysmal atrial fibrillation: Secondary | ICD-10-CM

## 2019-04-30 DIAGNOSIS — E119 Type 2 diabetes mellitus without complications: Secondary | ICD-10-CM | POA: Diagnosis not present

## 2019-04-30 DIAGNOSIS — Z951 Presence of aortocoronary bypass graft: Secondary | ICD-10-CM | POA: Diagnosis not present

## 2019-04-30 DIAGNOSIS — M5431 Sciatica, right side: Secondary | ICD-10-CM

## 2019-04-30 DIAGNOSIS — I10 Essential (primary) hypertension: Secondary | ICD-10-CM

## 2019-04-30 NOTE — Patient Instructions (Signed)
Medication Instructions:  Continue current medications  *If you need a refill on your cardiac medications before your next appointment, please call your pharmacy*  Lab Work: None Ordered   Testing/Procedures: None Ordered  Follow-Up: At [facility], you and your health needs are our priority.  As part of our continuing mission to provide you with exceptional heart care, we have created designated Provider Care Teams.  These Care Teams include your primary Cardiologist (physician) and Advanced Practice Providers (APPs -  Physician Assistants and Nurse Practitioners) who all work together to provide you with the care you need, when you need it.  Your next appointment:   6 month(s)  The format for your next appointment:   In Person  Provider:   Shelva Majestic, MD

## 2019-05-01 NOTE — Progress Notes (Signed)
Patient ID: Ryan Wagner, male   DOB: December 25, 1945, 73 y.o.   MRN: [id number]    Primary MD: Dr. Ernestine Conrad  HPI: Ryan Wagner is a 73 y.o. male whois a former patient of Dr. Chase Picket and last saw him in January 2010.  He establish cardiology care with me in February 2017.  He presents to the office today for cardiology evaluation.  [name] has a history of CAD and suffered an inferior wall myocardial infarction on 11/21/2002 and 3 drug-eluting stents were placed in the RCA and at that time.He was also found to have  moderate to severe disease in the obtuse marginal system.  He underwent repeat cardiac catheterization by Dr. Rex Kras which revealed diffuse disease in the circumflex vessel not amenable to percutaneous coronary intervention which medical therapy was recommended.  A nuclear perfusion study was done in October 2007 showed fairly normal perfusion in all myocardial segments.  He initially had been on dual antiplatelet therapy, but his Plavix was stopped 2008.  When he last saw Dr. Rex Kras in January 2010 he was without recurrent anginal symptoms.  He is followed by Dr. Shirline Frees for primary medical care.  Additional problems include type 2 diabetes mellitus, hyperlipidemia, hypertension, arthritis.  There is also a history of hiatal hernia with esophageal stricture.  He had undergone left knee replacement surgery.  He had seen Dr. [name] last in November 2016.  At that time he was on metoprolol, tartrate 50 Milgram's twice a day, Lotrel 10/20 daily for blood pressure control.  He was on atorvastatin 40 mg for hyperlipidemia.  He has been taking Victoza for his diabetes mellitus.  Laboratory done at that time revealed a hemoglobin of 15.6, hematocrit of 45.6.  Hemoglobin A1c was 5.9.  Lipid studies revealed a QTC 137, TG 1:30, HDL 32, LDL 78, and non-HDL 104.  He had normal renal function with a BUN of 12 and a creatinine of 0.81.  LFTs were normal.  TSH was normal  at 0.90.    He has been seen by Rosalyn Gess, PA-C and Doreene Adas, [place].  He has an apple watch and this past summer he was to potential short periods of atrial fibrillation n which she was asymptomatic by his apple watch.  He wore an event monitor from August 21 through September 19.  The predominant rhythm was sinus rhythm with the fastest heart rate at 120 bpm.  These harborage heartbeat was 63 bpm.  He had rare PVCs with a burden of 3% and very rare PACs less than 1%.  There was one episode of nonsustained VT lasting 4 beats at a heart rate of 106.  He also was found to have several short-lived episodes of PAF with an AF burden of less than 1%.  His slowest heart rate was in atrial fibrillation at 32 bpm.  He was subsequently seen by Doreene Adas all he was wearing the monitor.  He underwent an echo Doppler study in March 24, 2018 which showed normal systolic function with EF of 55 to 60%.  He did not have any regional wall motion abnormalities.  There was grade 1 diastolic dysfunction and mild dilation of his RV.  Over the past several months he states he has felt well but is watched detected an episode of AF on one occasion in November and then again in December which lasted for approximately 2 days.  On May 11, 2018 he called the answering service and as noted in telephone message  with [name], NP, he was asymptomatic without chest pain shortness of breath or palpitations but he reported atrial fibrillation rates in the 50-100 range.  He was started on Eliquis 5 mg twice a day and was instructed to stop his aspirin.  The patient feels well.  He denies chest pain.    The patient was seen by Dr. [name] and underwent bypass grafting x4 on June 11, 2018 with a LIMA to his LAD, sequential saphenous vein to his OM1 and OM 2, and saphenous vein graft to his PDA of his RCA.  He also underwent Maze procedure and atrial clip for his atrial fibrillation. He did well postop, he did have recurrent PAF the day  prior todischargeand was kept an extra 24 hours and placed on amiodarone.   He was seen in the office 07/23/2018 and was doing well. He was in NSR. He was hypertensive and his medications were adjusted (taken off ACE secondary to cough and placed on ARB).  His medications have also been adjusted twice by our pharmacist. His losartan was increased to 100 mg daily and a few days later amlodipine 10 mg was added, he was previously on a combination of amlodipine and ACE inhibitor.   When last seen by Kerin Ransom, he was feeling well.  Blood pressures were stable at home.  He denied any chest pain or palpitations.  He is no longer on amiodarone.  He was supposed to start back rehab in late March, but unfortunately this was deferred due to the COVID-19 pandemic.   I last evaluated him in a telemedicine visit on September 22, 2018.  At that time he was walking every day up to 30 minutes.  He denied chest pain PND orthopnea.  When he was discharged from the hospital he was discharged on atorvastatin 80 mg daily but apparently when he went to pick up his prescription there was a prior prescription of 40 mg  He continued to be on Victoza and metformin for his diabetes mellitus.  Since his last evaluation he has remained stable and denies any chest pain, palpitations, PND orthopnea.  He continues to be on amlodipine 10 mg, losartan 100 mg, metoprolol tartrate 25 mg twice a day for hypertension.  He has not been aware of any recurrent episodes of atrial fibrillation and continues to be on Eliquis 5 mg twice a day.  He is tolerating atorvastatin 80 mg for hyperlipidemia.  He has had issues with right leg sciatica.  He has seen Dr. Frederik Pear and is now on gabapentin.  He will need an MRI.  He has not been able to exercise due to his sciatica discomfort.  He presents for evaluation.  Past Medical History:  Diagnosis Date  . Arthritis    knees, hands  . Atrial fibrillation ([place])   . CAD (coronary artery disease)     a. prior MI in 2004 with DESx3 to RCA  . Complication of anesthesia    had difficulty voiding after hemorrhoidectomy  . Diabetes mellitus without complication ([place])    type 2  . GERD (gastroesophageal reflux disease)    no longer with symptoms  . Headache    migraines as a child  . Hyperlipidemia   . Hypertension   . Sciatica    improved    Past Surgical History:  Procedure Laterality Date  . ADENOIDECTOMY    . ANTERIOR CERVICAL DECOMP/DISCECTOMY FUSION N/A 04/15/2018   Procedure: Cervical Four-Five Cervical Five-Six Anterior cervical decompression/discectomy/fusion;  Surgeon: Zada Finders,  Joyice Faster, MD;  Location: [facility];  Service: Neurosurgery;  Laterality: N/A;  anterior approach  . APPENDECTOMY    . BAND HEMORRHOIDECTOMY    . CARDIAC CATHETERIZATION    . CLIPPING OF ATRIAL APPENDAGE N/A 06/11/2018   Procedure: CLIPPING OF LEFT ATRIAL APPENDAGE using a 35 AtriCure Clip;  Surgeon: Grace Isaac, MD;  Location: [facility];  Service: Open Heart Surgery;  Laterality: N/A;  . COLONOSCOPY    . CORONARY ANGIOPLASTY  2004  . CORONARY ARTERY BYPASS GRAFT N/A 06/11/2018   Procedure: CORONARY ARTERY BYPASS GRAFTING (CABG) x4 using left internal mammary artery and right greater saphenous vein harvested endoscopically. LIMA to distal LAD, Seq SVG to 1st & 2nd OM, SVG to distal PD;  Surgeon: Grace Isaac, MD;  Location: [facility];  Service: Open Heart Surgery;  Laterality: N/A;  . ESOPHAGEAL DILATION    . KNEE SURGERY Left   . LEFT HEART CATH AND CORONARY ANGIOGRAPHY N/A 06/10/2018   Procedure: LEFT HEART CATH AND CORONARY ANGIOGRAPHY;  Surgeon: Troy Sine, MD;  Location: [facility];  Service: Cardiovascular;  Laterality: N/A;  . LIPOMA EXCISION     neck  . MAZE N/A 06/11/2018   Procedure: Pulmonary isolation MAZE using bipolar ablation;  Surgeon: Grace Isaac, MD;  Location: [facility];  Service: Open Heart Surgery;  Laterality: N/A;  . TEE WITHOUT CARDIOVERSION N/A 06/11/2018    Procedure: TRANSESOPHAGEAL ECHOCARDIOGRAM (TEE);  Surgeon: Grace Isaac, MD;  Location: [facility];  Service: Open Heart Surgery;  Laterality: N/A;  . TONSILLECTOMY    . TOTAL KNEE ARTHROPLASTY Left 06/01/2014   Procedure: TOTAL KNEE ARTHROPLASTY;  Surgeon: Hessie Dibble, MD;  Location: [facility];  Service: Orthopedics;  Laterality: Left;    Allergies  Allergen Reactions  . Ace Inhibitors Cough    Current Outpatient Medications  Medication Sig Dispense Refill  . acetaminophen (TYLENOL) 325 MG tablet Take 2 tablets (650 mg total) by mouth every 6 (six) hours as needed for mild pain.    [name] amLODipine (NORVASC) 10 MG tablet Take 1 tablet by mouth once daily 30 tablet 11  . aspirin EC 81 MG EC tablet Take 1 tablet (81 mg total) by mouth daily.    [name] atorvastatin (LIPITOR) 80 MG tablet Take 1 tablet (80 mg total) by mouth daily. 90 tablet 1  . Coenzyme Q10 (CO Q 10) 100 MG CAPS Take 100 mg by mouth daily.     [name] ELIQUIS 5 MG TABS tablet Take 1 tablet by mouth twice daily 180 tablet 1  . esomeprazole (NEXIUM) 20 MG capsule Take 20 mg by mouth daily.     [name] gabapentin (NEURONTIN) 100 MG capsule Take 100 mg by mouth 3 (three) times daily.    [name] 500 MG CAPS Take 500 mg by mouth daily.    [name] L-Arginine 1000 MG TABS Take 1 tablet by mouth daily. L-Arginine 1000 mg / L-Citrulline     . latanoprost (XALATAN) 0.005 % ophthalmic solution Place 1 drop into both eyes at bedtime.    . Liraglutide (VICTOZA) 18 MG/3ML SOLN Inject 1.2 mg into the skin daily.     [name] losartan (COZAAR) 100 MG tablet Take 1 tablet (100 mg total) by mouth daily. 90 tablet 3  . MAGNESIUM GLUCONATE PO Take 150 mg by mouth daily.    . metFORMIN (GLUCOPHAGE) 500 MG tablet Take 500 mg by mouth 2 (two) times daily with a meal.    .  metoprolol tartrate (LOPRESSOR) 25 MG tablet Take 1 tablet by mouth twice daily 60 tablet 5  . Multiple Vitamin (MULTIVITAMIN) tablet Take 1 tablet by mouth daily.    . Turmeric 500 MG CAPS Take 500 mg by  mouth daily.     No current facility-administered medications for this visit.     Social History   Socioeconomic History  . Marital status: Married    Spouse name: Not on file  . Number of children: Not on file  . Years of education: Not on file  . Highest education level: Not on file  Occupational History  . Not on file  Social Needs  . Financial resource strain: Not on file  . Food insecurity    Worry: Not on file    Inability: Not on file  . Transportation needs    Medical: Not on file    Non-medical: Not on file  Tobacco Use  . Smoking status: Never Smoker  . Smokeless tobacco: Never Used  Substance and Sexual Activity  . Alcohol use: Yes    Comment: occasional  . Drug use: No  . Sexual activity: Not on file  Lifestyle  . Physical activity    Days per week: Not on file    Minutes per session: Not on file  . Stress: Not on file  Relationships  . Social Herbalist on phone: Not on file    Gets together: Not on file    Attends religious service: Not on file    Active member of club or organization: Not on file    Attends meetings of clubs or organizations: Not on file    Relationship status: Not on file  . Intimate partner violence    Fear of current or ex partner: Not on file    Emotionally abused: Not on file    Physically abused: Not on file    Forced sexual activity: Not on file  Other Topics Concern  . Not on file  Social History Narrative  . Not on file   Additional social history is notable that he is married since 31.  He has 2 children 5 grandchildren.  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  He attended [employer] for college.  There is no tobacco.  He drinks occasional wine.  He does walk but not regularly.  Family History  Problem Relation Age of Onset  . Dementia Mother   . Thyroid disease Mother   . Dementia Father   . High Cholesterol Father    Additional family history is notable that both parents died at age 67.   His father died of pneumonia.  He has one sister age 74, who is healthy.  His children are age 10 and 22.  ROS General: Negative; No fevers, chills, or night sweats HEENT: Negative; No changes in vision or hearing, sinus congestion, difficulty swallowing Pulmonary: Negative; No cough, wheezing, shortness of breath, hemoptysis Cardiovascular: See HPI GI: Negative; No nausea, vomiting, diarrhea, or abdominal pain GU: Negative; No dysuria, hematuria, or difficulty voiding Musculoskeletal: Negative; no myalgias, joint pain, or weakness Hematologic: Negative; no easy bruising, bleeding Endocrine: Negative; no heat/cold intolerance; no diabetes, Neuro: Positive for right lower extremity sciatica symptoms Skin: Negative; No rashes or skin lesions Psychiatric: Negative; No behavioral problems, depression Sleep: Negative; No snoring,  daytime sleepiness, hypersomnolence, bruxism, restless legs, hypnogognic hallucinations. Other comprehensive 14 point system review is negative   Physical Exam BP 136/68 (BP Location: Left  Arm, Patient Position: Sitting, Cuff Size: Normal)   Pulse (!) 47   Temp (!) 97.3 F (36.3 C)   Ht 5' 7"  (1.702 m)   Wt 193 lb (87.5 kg)   BMI 30.23 kg/m    Repeat blood pressure by me was 138/78  Wt Readings from Last 3 Encounters:  04/30/19 193 lb (87.5 kg)  09/22/18 175 lb (79.4 kg)  08/13/18 181 lb 12.8 oz (82.5 kg)   General: Alert, oriented, no distress.  Skin: normal turgor, no rashes, warm and dry HEENT: Normocephalic, atraumatic. Pupils equal round and reactive to light; sclera anicteric; extraocular muscles intact;  Nose without nasal septal hypertrophy Mouth/Parynx benign; Mallinpatti scale 3 Neck: No JVD, no carotid bruits; normal carotid upstroke Lungs: clear to ausculatation and percussion; no wheezing or rales Chest wall: without tenderness to palpitation Heart: PMI not displaced, RRR, s1 s2 normal, 7-6/2 systolic murmur, no diastolic murmur, no rubs,  gallops, thrills, or heaves Abdomen: soft, nontender; no hepatosplenomehaly, BS+; abdominal aorta nontender and not dilated by palpation. Back: no CVA tenderness Pulses 2+ Musculoskeletal: full range of motion, normal strength, no joint deformities Extremities: no clubbing cyanosis or edema, Homan's sign negative  Neurologic: grossly nonfocal; Cranial nerves grossly wnl Psychologic: Normal mood and affect   ECG (independently read by me): Sinus bradycardia at 47 with first-degree AV block.  Nonspecific ST-T changes.  January 2020 ECG (independently read by me): Sinus bradycardia at 50 bpm; first-degree AV block.  Q wave in lead III with new inferior and anterolteral T wave abnormalities which are new since his last ECG of 01/29/2018.  February 2017 ECG (independently read by me): Sinus bradycardia 59 bpm.  First-degree AV block with a PR interval at 232 ms.  No significant ST-T changes.   LABS: I personally reviewed the laboratory done at [facility] by Dr Viona Gilmore. Azalia Bilis as noted above.  BMP Latest Ref Rng & Units 08/13/2018 06/15/2018 06/14/2018  Glucose 65 - 99 mg/dL 132(H) 91 94  BUN 8 - 27 mg/dL 12 23 25(H)  Creatinine 0.76 - 1.27 mg/dL 0.87 1.16 1.24  BUN/Creat Ratio 10 - 24 14 - -  Sodium 134 - 144 mmol/L 139 136 135  Potassium 3.5 - 5.2 mmol/L 4.3 4.1 3.7  Chloride 96 - 106 mmol/L 102 103 102  CO2 20 - 29 mmol/L 21 23 22   Calcium 8.6 - 10.2 mg/dL 9.1 7.9(L) 8.0(L)     Hepatic Function Latest Ref Rng & Units 06/10/2018 06/05/2018  Total Protein 6.5 - 8.1 g/dL 6.2(L) 6.2  Albumin 3.5 - 5.0 g/dL 3.8 4.2  AST 15 - 41 U/L 19 15  ALT 0 - 44 U/L 26 24  Alk Phosphatase 38 - 126 U/L 71 89  Total Bilirubin 0.3 - 1.2 mg/dL 1.2 0.7    CBC Latest Ref Rng & Units 06/14/2018 06/13/2018 06/12/2018  WBC 4.0 - 10.5 K/uL 15.1(H) 17.5(H) 16.9(H)  Hemoglobin 13.0 - 17.0 g/dL 10.2(L) 9.9(L) 10.0(L)  Hematocrit 39.0 - 52.0 % 30.7(L) 30.1(L) 30.8(L)  Platelets 150 - 400 K/uL 162 128(L) 130(L)   Lab  Results  Component Value Date   MCV 87.7 06/14/2018   MCV 89.3 06/13/2018   MCV 88.0 06/12/2018    Lab Results  Component Value Date   TSH 0.997 06/05/2018    BNP No results found for: BNP  ProBNP No results found for: PROBNP   Lipid Panel  No results found for: CHOL, TRIG, HDL, CHOLHDL, VLDL, LDLCALC, LDLDIRECT   RADIOLOGY: No results found.  IMPRESSION:  1. CAD in native artery   2. Hx of CABG   3. PAF (paroxysmal atrial fibrillation) ([place])   4. Chronic anticoagulation   5. Essential hypertension   6. Type 2 diabetes mellitus without complication, without long-term current use of insulin ([facility])   7. Sciatica of right side     ASSESSMENT AND PLAN: Mr. Everrett Lacasse is a 73 year old gentleman who suffered an inferior wall myocardial infarction in 2004 and was treated successfully with insertion of 3 stents in his RCA.  He had diffuse disease in his circumflex vessel.  His last nuclear perfusion study was in 2007 which showed fairly normal perfusion.  He has a history of hypertension as well as diabetes mellitus.  He has not had any anginal symptoms since his CABG revascularization surgery.  His blood pressure today is relatively stable on his regimen consisting of amlodipine 10 mg, losartan 100 mg, in addition to metoprolol tartrate 25 mg twice a day.  We discussed optimal blood pressure less than 120/80 with stage I hypertension commencing at 130/80.  He is unaware of any recurrent episodes of atrial fibrillation and has an apple watch and he has not had any recurrent events since his maze procedure and atrial clip.  He has had issues with significant sciatica and was recently started on gabapentin.  He will be undergoing an MRI.  His cardiac stents have been long-term in his atrial clip should be okay for his MRI.  He is diabetic on metformin and Victoza.  Following his MRI, if he requires epidural injections he will need to hold Eliquis for 72 hours.  I will see him in 6  months for cardiology reevaluation  Time spent: 25 minutes Troy Sine, MD, [facility]  05/02/2019 7:01 PM

## 2019-05-02 ENCOUNTER — Encounter: Payer: Self-pay | Admitting: Cardiovascular Disease

## 2019-05-02 IMAGING — CR DG CHEST 2V
2 series · 2 of 2 positions shown · non-contrast
Comparison: 06/01/2014

CLINICAL DATA: Preoperative for coronary artery bypass schedule for
[DATE].

EXAM:
CHEST - 2 VIEW

[chest pa]
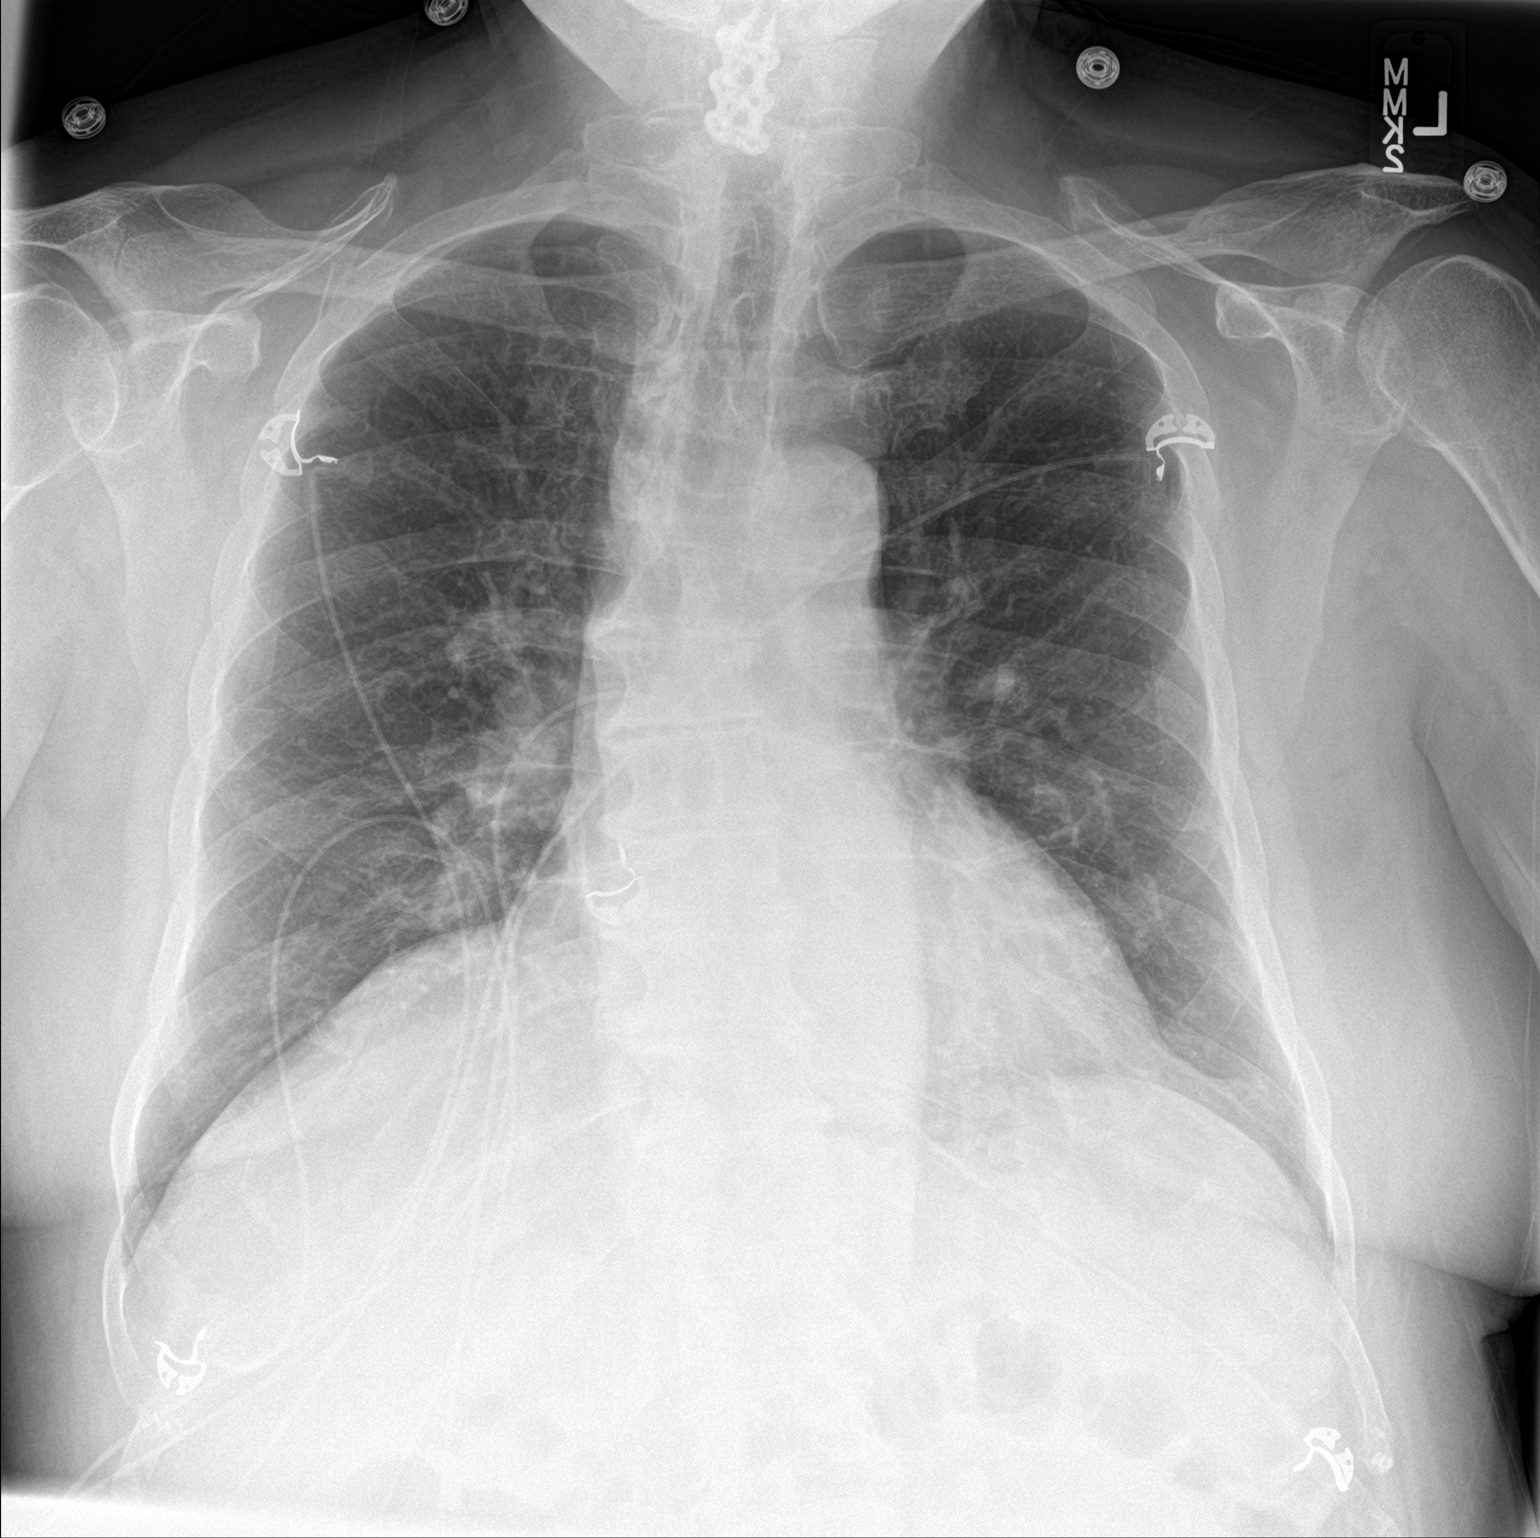

[chest lat]
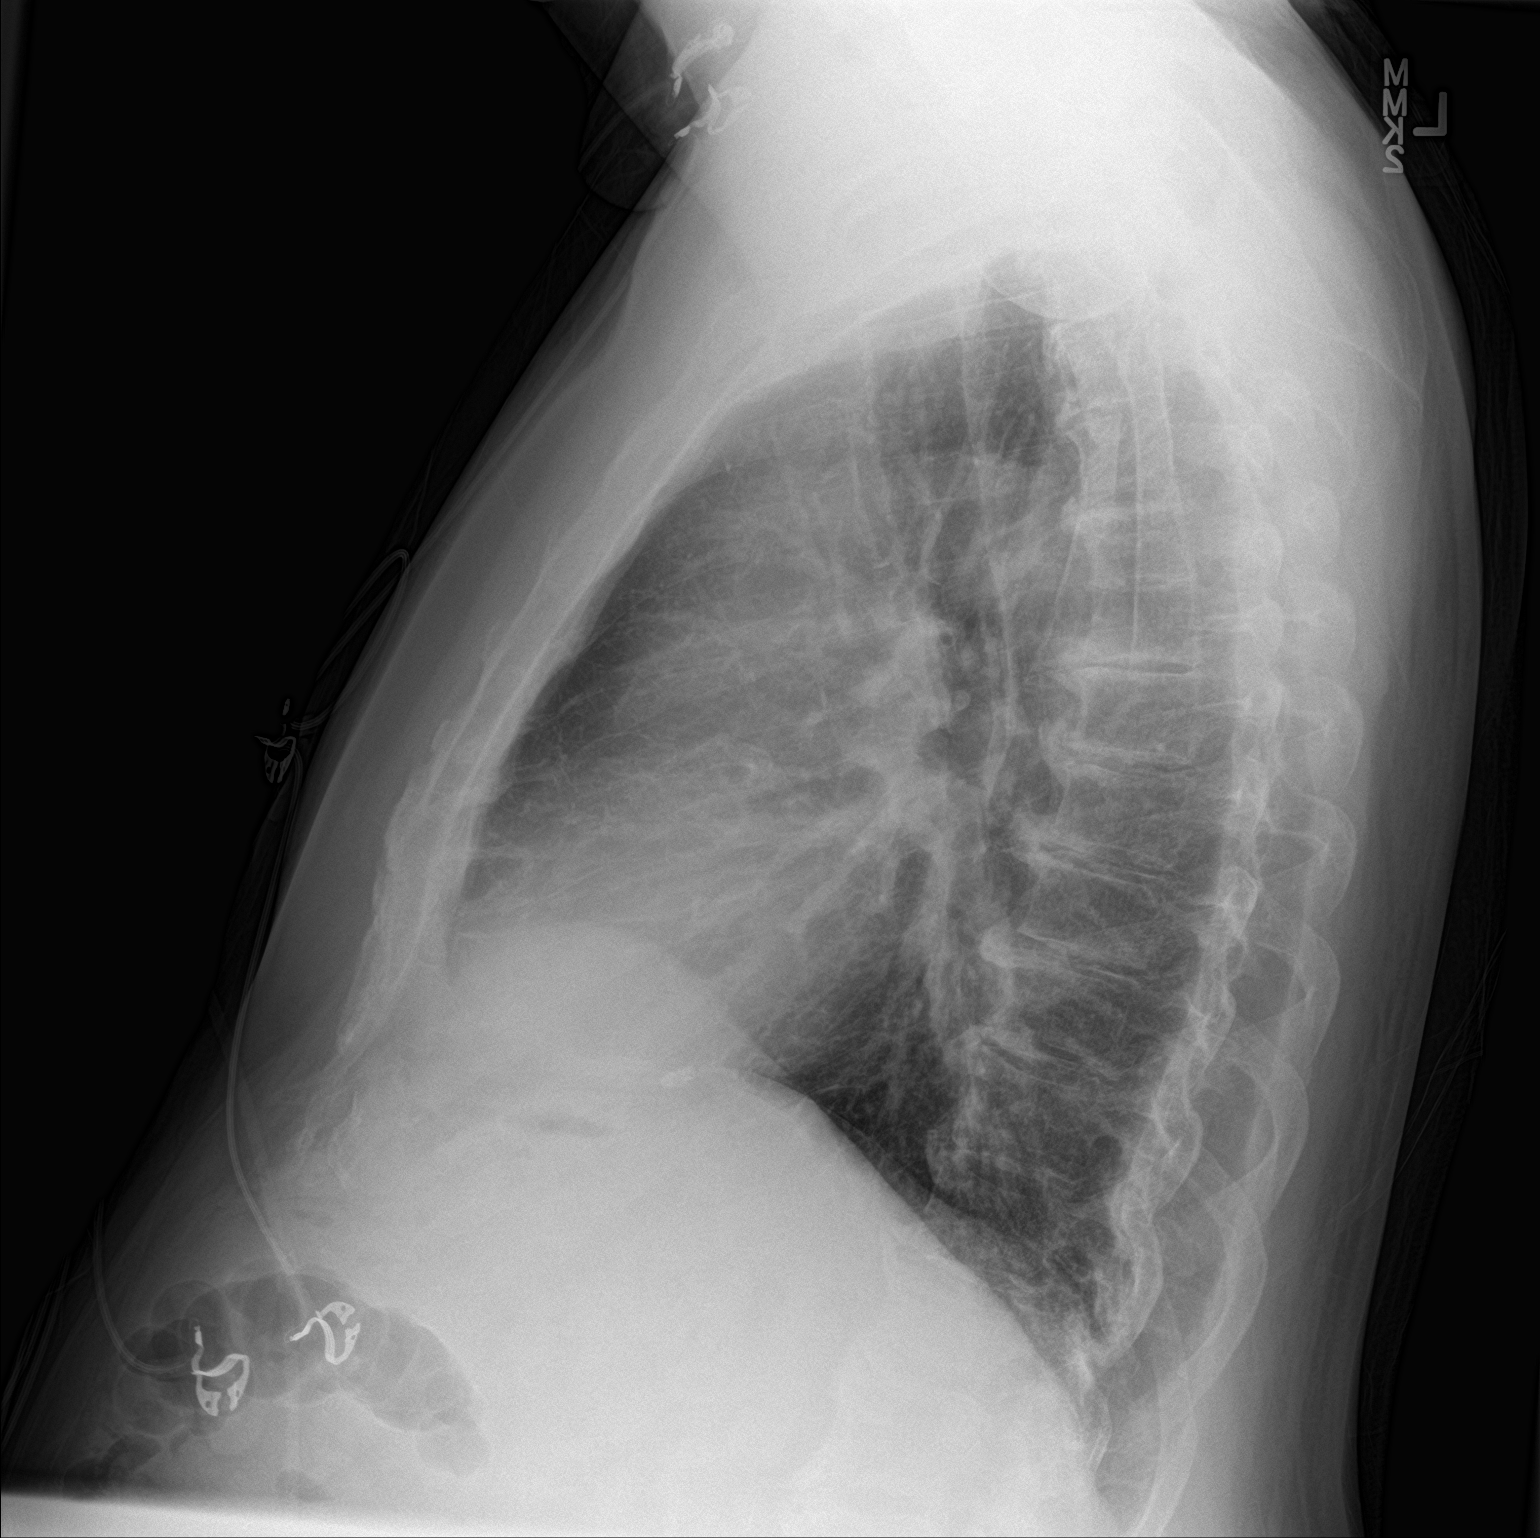

[2 of 2 positions shown; findings below may reference images not displayed]

FINDINGS: Normal heart size and pulmonary vascularity. No focal airspace
disease or consolidation in the lungs. No blunting of costophrenic
angles. No pneumothorax. Mediastinal contours appear intact.
Degenerative changes in the spine. Postoperative changes in the
cervical spine.
IMPRESSION: No active cardiopulmonary disease.

## 2019-05-03 IMAGING — DX DG CHEST 1V PORT
1 series · 1 of 1 positions shown · non-contrast
Comparison: 06/10/2018

CLINICAL DATA: Postop CABG

EXAM:
PORTABLE CHEST 1 VIEW

[chest ap]
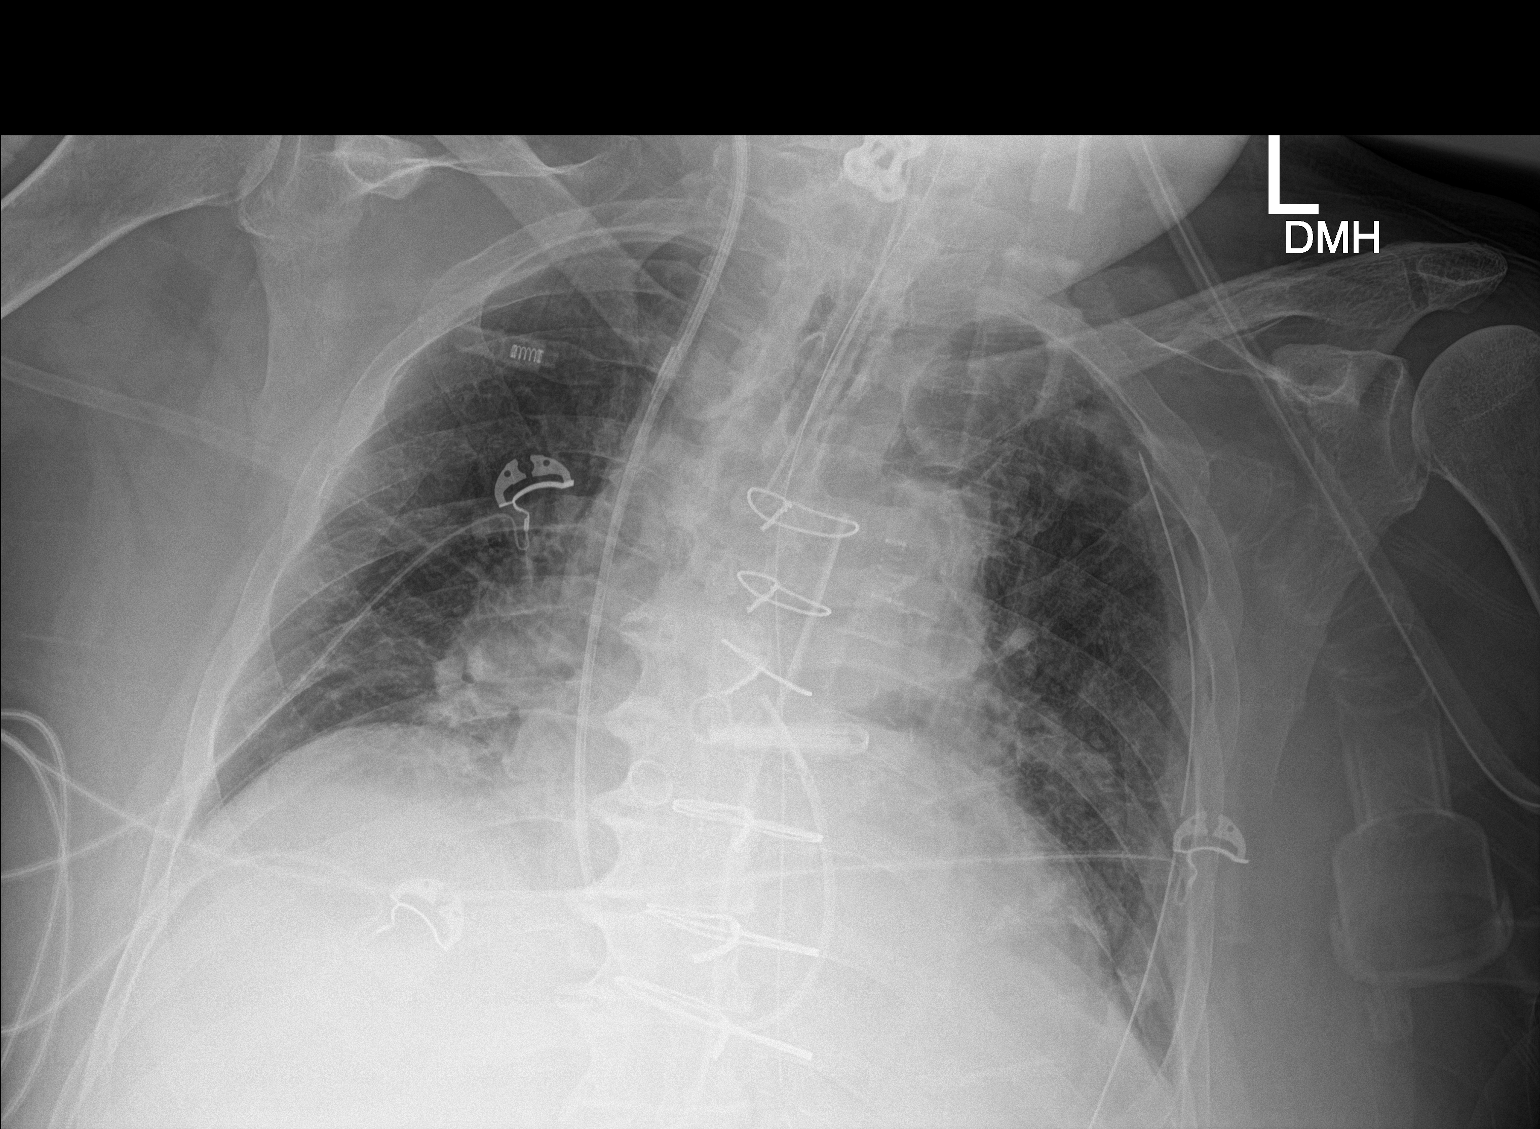

[1 of 1 positions shown; findings below may reference images not displayed]

FINDINGS: Endotracheal tube in good position. Swan-Ganz catheter main
pulmonary artery. Left chest tube in place. Mediastinal drain in
place.

Negative for pneumothorax. No significant pleural effusion.
Bibasilar atelectasis with hypoventilated lungs. Negative for edema.
IMPRESSION: Hypoventilation with bibasilar atelectasis.  No pneumothorax.

## 2019-05-05 IMAGING — DX DG CHEST 1V PORT
1 series · 1 of 1 positions shown · non-contrast
Comparison: 06/12/2018

CLINICAL DATA: Status post CABG.

EXAM:
PORTABLE CHEST 1 VIEW

[chest]
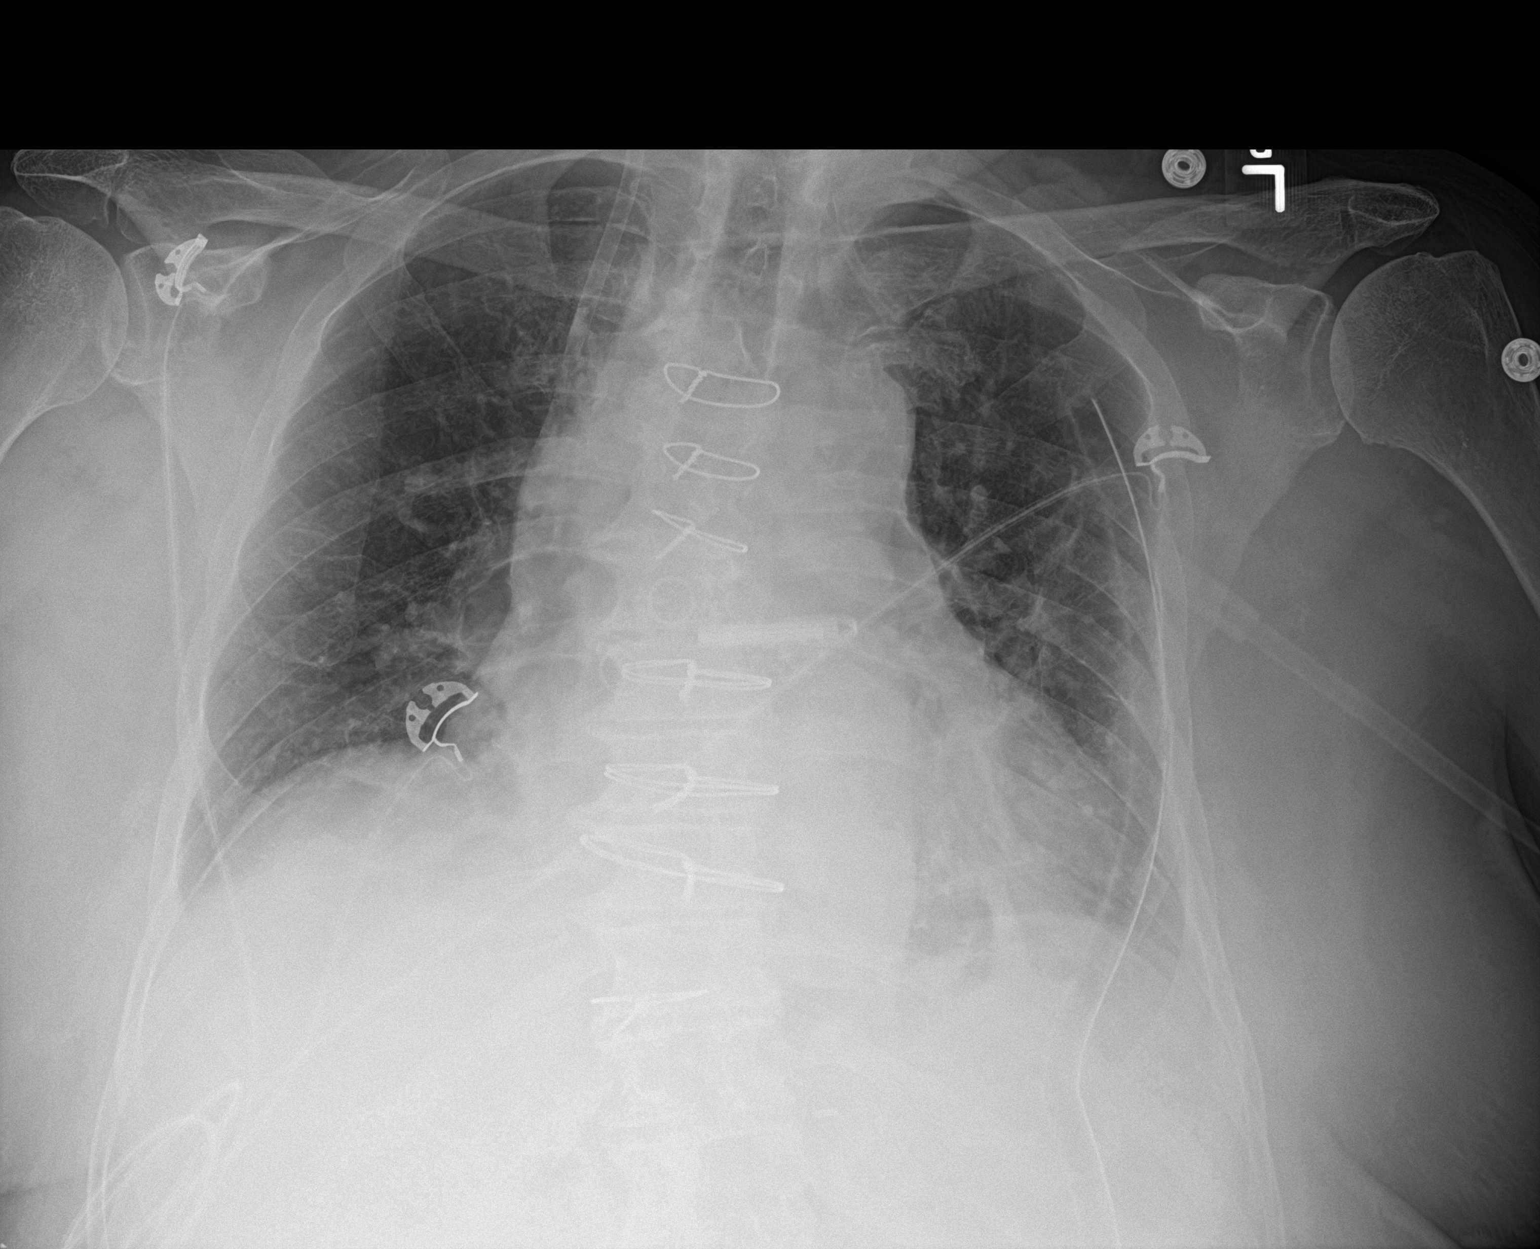

[1 of 1 positions shown; findings below may reference images not displayed]

FINDINGS: Sequelae of CABG are again identified. Swan-Ganz catheter and
mediastinal drain have been removed. A right jugular sheath and left
chest tube remain in place. The cardiomediastinal silhouette is
unchanged with borderline cardiomegaly. There is a trace left apical
pneumothorax which was not evident previously. Mild left greater
than right basilar lung opacities are unchanged. No sizable pleural
effusion is identified.
IMPRESSION: 1. Interval removal of Swan-Ganz catheter and mediastinal drain.
2. Trace left apical pneumothorax.  Unchanged left chest tube.
3. Unchanged mild bibasilar atelectasis.

## 2019-05-06 DIAGNOSIS — M545 Low back pain: Secondary | ICD-10-CM | POA: Diagnosis not present

## 2019-05-06 IMAGING — DX DG CHEST 1V PORT
1 series · 1 of 1 positions shown · non-contrast
Comparison: 06/13/2018

CLINICAL DATA: Chest tube in place

EXAM:
PORTABLE CHEST 1 VIEW

[chest ap]
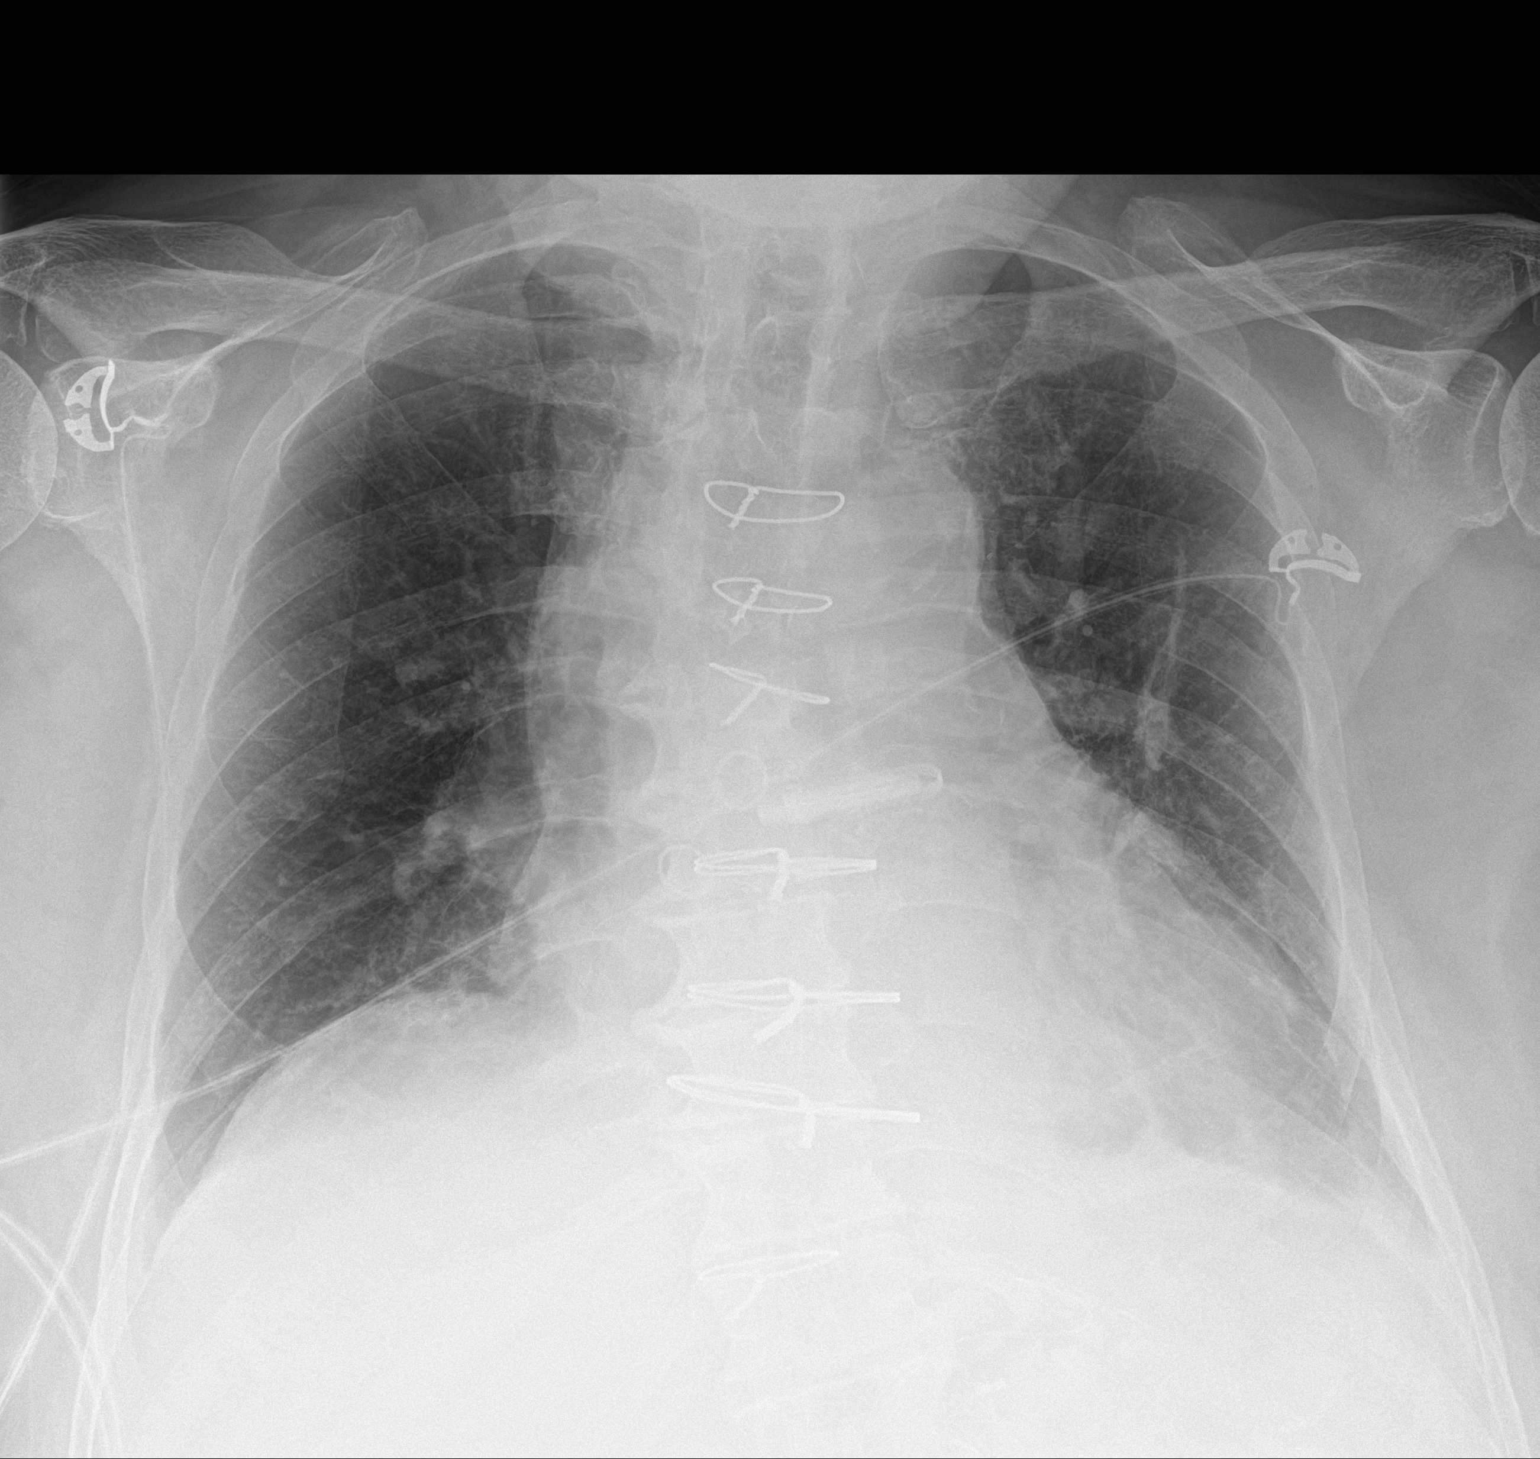

[1 of 1 positions shown; findings below may reference images not displayed]

FINDINGS: Left chest tube removed. Right jugular sheath removed. No
pneumothorax.

Mild bibasilar atelectasis with slight improvement. No edema. Small
bilateral effusions.
IMPRESSION: No pneumothorax post left chest tube removal.

Mild improvement in bibasilar atelectasis.

## 2019-05-12 DIAGNOSIS — M5416 Radiculopathy, lumbar region: Secondary | ICD-10-CM | POA: Diagnosis not present

## 2019-06-04 DIAGNOSIS — M25559 Pain in unspecified hip: Secondary | ICD-10-CM | POA: Diagnosis not present

## 2019-06-04 DIAGNOSIS — M5416 Radiculopathy, lumbar region: Secondary | ICD-10-CM | POA: Diagnosis not present

## 2019-06-04 DIAGNOSIS — I10 Essential (primary) hypertension: Secondary | ICD-10-CM | POA: Diagnosis not present

## 2019-06-04 DIAGNOSIS — Z6830 Body mass index (BMI) 30.0-30.9, adult: Secondary | ICD-10-CM | POA: Diagnosis not present

## 2019-06-07 ENCOUNTER — Other Ambulatory Visit: Payer: Self-pay | Admitting: Surgical

## 2019-06-11 DIAGNOSIS — M5416 Radiculopathy, lumbar region: Secondary | ICD-10-CM | POA: Diagnosis not present

## 2019-06-12 DIAGNOSIS — I2510 Atherosclerotic heart disease of native coronary artery without angina pectoris: Secondary | ICD-10-CM | POA: Diagnosis not present

## 2019-06-12 DIAGNOSIS — E119 Type 2 diabetes mellitus without complications: Secondary | ICD-10-CM | POA: Diagnosis not present

## 2019-06-12 DIAGNOSIS — M179 Osteoarthritis of knee, unspecified: Secondary | ICD-10-CM | POA: Diagnosis not present

## 2019-06-12 DIAGNOSIS — E782 Mixed hyperlipidemia: Secondary | ICD-10-CM | POA: Diagnosis not present

## 2019-06-12 DIAGNOSIS — I10 Essential (primary) hypertension: Secondary | ICD-10-CM | POA: Diagnosis not present

## 2019-06-12 DIAGNOSIS — I480 Paroxysmal atrial fibrillation: Secondary | ICD-10-CM | POA: Diagnosis not present

## 2019-06-22 ENCOUNTER — Other Ambulatory Visit: Payer: Self-pay | Admitting: Cardiovascular Disease

## 2019-06-25 DIAGNOSIS — M5416 Radiculopathy, lumbar region: Secondary | ICD-10-CM | POA: Diagnosis not present

## 2019-07-09 DIAGNOSIS — M5416 Radiculopathy, lumbar region: Secondary | ICD-10-CM | POA: Diagnosis not present

## 2019-07-21 DIAGNOSIS — E119 Type 2 diabetes mellitus without complications: Secondary | ICD-10-CM | POA: Diagnosis not present

## 2019-07-23 DIAGNOSIS — M179 Osteoarthritis of knee, unspecified: Secondary | ICD-10-CM | POA: Diagnosis not present

## 2019-07-23 DIAGNOSIS — I480 Paroxysmal atrial fibrillation: Secondary | ICD-10-CM | POA: Diagnosis not present

## 2019-07-23 DIAGNOSIS — E782 Mixed hyperlipidemia: Secondary | ICD-10-CM | POA: Diagnosis not present

## 2019-07-23 DIAGNOSIS — I10 Essential (primary) hypertension: Secondary | ICD-10-CM | POA: Diagnosis not present

## 2019-07-23 DIAGNOSIS — I2510 Atherosclerotic heart disease of native coronary artery without angina pectoris: Secondary | ICD-10-CM | POA: Diagnosis not present

## 2019-07-23 DIAGNOSIS — E119 Type 2 diabetes mellitus without complications: Secondary | ICD-10-CM | POA: Diagnosis not present

## 2019-08-10 DIAGNOSIS — M533 Sacrococcygeal disorders, not elsewhere classified: Secondary | ICD-10-CM | POA: Diagnosis not present

## 2019-08-13 DIAGNOSIS — M533 Sacrococcygeal disorders, not elsewhere classified: Secondary | ICD-10-CM | POA: Diagnosis not present

## 2019-08-14 ENCOUNTER — Other Ambulatory Visit: Payer: Self-pay | Admitting: Cardiology

## 2019-08-25 ENCOUNTER — Other Ambulatory Visit: Payer: Self-pay | Admitting: Cardiology

## 2019-08-26 DIAGNOSIS — I10 Essential (primary) hypertension: Secondary | ICD-10-CM | POA: Diagnosis not present

## 2019-08-27 MED ORDER — LOSARTAN POTASSIUM 100 MG PO TABS: 100.0000 mg | ORAL_TABLET | Freq: Every day | ORAL | 3 refills | Status: DC

## 2019-08-28 ENCOUNTER — Other Ambulatory Visit: Payer: Self-pay | Admitting: Cardiology

## 2019-09-08 DIAGNOSIS — M533 Sacrococcygeal disorders, not elsewhere classified: Secondary | ICD-10-CM | POA: Diagnosis not present

## 2019-09-11 ENCOUNTER — Other Ambulatory Visit: Payer: Self-pay | Admitting: Cardiovascular Disease

## 2019-09-14 DIAGNOSIS — E782 Mixed hyperlipidemia: Secondary | ICD-10-CM | POA: Diagnosis not present

## 2019-09-14 DIAGNOSIS — E119 Type 2 diabetes mellitus without complications: Secondary | ICD-10-CM | POA: Diagnosis not present

## 2019-09-14 DIAGNOSIS — I10 Essential (primary) hypertension: Secondary | ICD-10-CM | POA: Diagnosis not present

## 2019-09-14 DIAGNOSIS — M179 Osteoarthritis of knee, unspecified: Secondary | ICD-10-CM | POA: Diagnosis not present

## 2019-09-14 DIAGNOSIS — I2510 Atherosclerotic heart disease of native coronary artery without angina pectoris: Secondary | ICD-10-CM | POA: Diagnosis not present

## 2019-09-14 DIAGNOSIS — M2550 Pain in unspecified joint: Secondary | ICD-10-CM | POA: Diagnosis not present

## 2019-09-24 DIAGNOSIS — E119 Type 2 diabetes mellitus without complications: Secondary | ICD-10-CM | POA: Diagnosis not present

## 2019-09-24 DIAGNOSIS — I2510 Atherosclerotic heart disease of native coronary artery without angina pectoris: Secondary | ICD-10-CM | POA: Diagnosis not present

## 2019-09-24 DIAGNOSIS — M179 Osteoarthritis of knee, unspecified: Secondary | ICD-10-CM | POA: Diagnosis not present

## 2019-09-24 DIAGNOSIS — E782 Mixed hyperlipidemia: Secondary | ICD-10-CM | POA: Diagnosis not present

## 2019-09-24 DIAGNOSIS — I480 Paroxysmal atrial fibrillation: Secondary | ICD-10-CM | POA: Diagnosis not present

## 2019-09-24 DIAGNOSIS — I10 Essential (primary) hypertension: Secondary | ICD-10-CM | POA: Diagnosis not present

## 2019-09-29 DIAGNOSIS — H6123 Impacted cerumen, bilateral: Secondary | ICD-10-CM | POA: Diagnosis not present

## 2019-09-29 DIAGNOSIS — M549 Dorsalgia, unspecified: Secondary | ICD-10-CM | POA: Diagnosis not present

## 2019-09-29 DIAGNOSIS — I10 Essential (primary) hypertension: Secondary | ICD-10-CM | POA: Diagnosis not present

## 2019-09-29 DIAGNOSIS — I2510 Atherosclerotic heart disease of native coronary artery without angina pectoris: Secondary | ICD-10-CM | POA: Diagnosis not present

## 2019-09-29 DIAGNOSIS — K219 Gastro-esophageal reflux disease without esophagitis: Secondary | ICD-10-CM | POA: Diagnosis not present

## 2019-09-29 DIAGNOSIS — E119 Type 2 diabetes mellitus without complications: Secondary | ICD-10-CM | POA: Diagnosis not present

## 2019-09-29 DIAGNOSIS — E782 Mixed hyperlipidemia: Secondary | ICD-10-CM | POA: Diagnosis not present

## 2019-10-08 DIAGNOSIS — M179 Osteoarthritis of knee, unspecified: Secondary | ICD-10-CM | POA: Diagnosis not present

## 2019-10-08 DIAGNOSIS — E119 Type 2 diabetes mellitus without complications: Secondary | ICD-10-CM | POA: Diagnosis not present

## 2019-10-08 DIAGNOSIS — I10 Essential (primary) hypertension: Secondary | ICD-10-CM | POA: Diagnosis not present

## 2019-10-08 DIAGNOSIS — E782 Mixed hyperlipidemia: Secondary | ICD-10-CM | POA: Diagnosis not present

## 2019-10-08 DIAGNOSIS — I480 Paroxysmal atrial fibrillation: Secondary | ICD-10-CM | POA: Diagnosis not present

## 2019-10-08 DIAGNOSIS — I2510 Atherosclerotic heart disease of native coronary artery without angina pectoris: Secondary | ICD-10-CM | POA: Diagnosis not present

## 2019-10-14 DIAGNOSIS — M25551 Pain in right hip: Secondary | ICD-10-CM | POA: Diagnosis not present

## 2019-10-19 DIAGNOSIS — H401132 Primary open-angle glaucoma, bilateral, moderate stage: Secondary | ICD-10-CM | POA: Diagnosis not present

## 2019-10-21 DIAGNOSIS — E119 Type 2 diabetes mellitus without complications: Secondary | ICD-10-CM | POA: Diagnosis not present

## 2019-10-21 DIAGNOSIS — H43813 Vitreous degeneration, bilateral: Secondary | ICD-10-CM | POA: Diagnosis not present

## 2019-10-21 DIAGNOSIS — H33191 Other retinoschisis and retinal cysts, right eye: Secondary | ICD-10-CM | POA: Diagnosis not present

## 2019-10-21 DIAGNOSIS — H43391 Other vitreous opacities, right eye: Secondary | ICD-10-CM | POA: Diagnosis not present

## 2019-10-28 ENCOUNTER — Other Ambulatory Visit: Payer: Self-pay

## 2019-10-28 NOTE — Patient Outreach (Addendum)
[facility]) Care Management Chronic Special Needs Program  10/28/2019  Name: NIVAN MCKEAGUE DOB: 02-Jan-1946  MRN: [id number]  Mr. Miken Desrochers is enrolled in a chronic special needs plan for Diabetes. Chronic Care Management Coordinator telephoned client to follow up, review health risk assessment and to update individualized care plan. RNCM reviewed the chronic care management program, importance of client participation, and taking their care plan to all provider appointments and inpatient facilities.  .  Subjective: client reports primary concern is finding out what is causing his lower back/right hip pain. Client reports noted pain after playing golf .  Most notable when waking after laying in bed overnight. He reports it is a "1" at this time. He reports ice and gabapentin helps. He reports he is attending provider visits and provider is attempting to locate the source of the pain. Client reports he has an appointment scheduled tomorrow for injection.   History of diabetes. Hemoglobin A1C 6.5 on 09/29/2019. Client reports he checks blood sugar on occasion. Primary care provider aware. Denies any hypoglycemic events. Client denies any issues or concerns at this time.  History of hypertension, heart disease and atrial fibrillation. Client reports he has not had any atrial fibrillation since CABG January 2020 and MAZE procedure. Client reports he is doing well and denies any issues or concerns.   Goals Addressed            This Visit's Progress   . Client will report no worsening of symptoms of Atrial Fibrillation within the next 6 months   On track    Call your provider if you have a racing or irregular heartbeat that my be uncomfortable, shortness of breath with or without chest pain, weakness and dizziness. Call 911 for sever symptoms such as chest pain, fainting and struggling to breathe. Discussed previous Maze procedure. Increase exercise only if you are able  to do it. Follow doctor recommendations. Follow up with you cardiologist (heart doctor) for yearly visits.  Emmi education provided:"what is atrial fibrillation".    . Client will report no worsening of symptoms related to heart disease within the next 6-9 months    On track    Discussed previous symptoms prior to cardiac surgery. Client voiced understanding of follow-up with providers at scheduled visits. Notify your provider for symptoms of chest pain, sweating, nausea/vomiting, irregular heart beat, palpitations, rapid heart rate, shortness of breath or dizziness or fainting. Call 911 for sever symptoms of chest pain or shortness of breath.    . Client will verbalize knowledge of self management of Hypertension as evidences by BP reading of 140/90 or less; or as defined by provider   On track    Plan to check blood pressure regularly. If you do not have a blood pressure monitor, check with the over the counter catalog. If you are not able to obtain a monitor, one will be provided to you. Take your blood pressure medication  as prescribed. Plan to eat low salt and heart healthy meals with fruits, vegetables, whole grains, lean protein and limit fat and sugars. Emmi education provided on "DASH diet. Please review and plan to discuss during next telephonic assessment.     [name] HEMOGLOBIN A1C < 7 (pt-stated)       Congratulation, your A1C is below goal at 6.5. Good job! Have your A1C checked every 6 months, if you are at goal or every 3 months-6 months per your provider recommendation. Plan to eat low carbohydrate and low salt  meals, watch portion sizes and avoide sugar sweetened drinks. Discussed hypoglycemic events and Rule of 15. Follow provider recommendation regarding exercise.     . Obtain annual  Lipid Profile, LDL-C   On track    Try to avoid saturated fats, trans-fats and eat mor fiber. Plan to take a statin (cholesterol) medicine as prescribed. Emmi Education provided, "Lipid Tests".  Review and plan to discuss with RN during next telephonic assessment.    . COMPLETED: Obtain Annual Eye (retinal)  Exam        Eye exam completed 07/21/2019    . Obtain Annual Foot Exam   Not on track    Reports last foot exam 2019 due to covid Goal renewed 2021: Your doctor should check your bare feet at each visit.  Diabetes can affect the nerves in your feet, causing decreased feeling or numbness. Emmi Education: diabetes foot care. Review and plan to discuss with RN during next telephonic assessment.    . Obtain annual screen for micro albuminuria (urine) , nephropathy (kidney problems)   Not on track    Client reports due to pandemic, last completed 04/08/1018. Goal renewed 2021: Diabetes can affect our kidneys. It is important for your doctor to check your urine at least once a year. These tests show how your kidneys are working.    . Obtain Hemoglobin A1C at least 2 times per year   On track    Attend provider visits for recommended labs as scheduled.    . Patient Stated:"I want to find out the source of this pain"   On track    Attend provider visits as scheduled. Continue to be as active as tolerated. Follow up with appointment regarding evaluation of lower back pain as scheduled. Take medications as prescribed. Mailed education: low back pain in adults. Please review and plan to discuss during with RN at next telephonic assessment.     . Visit Primary Care Provider or Endocrinologist at least 2 times per year    On track    Complete your annual wellness visit every year. Follow up with appointment regarding evaluation of lower back pain as scheduled.       Client reports he has not had the COVID vaccine and has decided not to take. He reports he has done research on the vaccine and declines to take the vaccine at this time, but is taking different vitamins instead. RNCM reinforced the cofid 19 precautions.  Discussed over-the-counter benefit(OTC). Client to call health  care concierge if he has any questions or issues with use of the OTC card.  RNCM encouraged client to call RNCM as needed.  Plan: send updated care plan to client, send updated care plan to primary care. RNCM will outreach per tier level within the next 9-12 months.   Thea Silversmith, RN, MSN, [place] [phone]

## 2019-10-29 DIAGNOSIS — M25551 Pain in right hip: Secondary | ICD-10-CM | POA: Diagnosis not present

## 2019-10-31 ENCOUNTER — Encounter (HOSPITAL_COMMUNITY): Payer: Self-pay

## 2019-10-31 ENCOUNTER — Other Ambulatory Visit: Payer: Self-pay

## 2019-10-31 ENCOUNTER — Ambulatory Visit (HOSPITAL_COMMUNITY)
Admission: EM | Admit: 2019-10-31 | Discharge: 2019-10-31 | Disposition: A | Discharge status: Home / Self Care | Payer: HMO | Attending: Family Medicine | Admitting: Family Medicine

## 2019-10-31 DIAGNOSIS — M545 Low back pain, unspecified: Secondary | ICD-10-CM

## 2019-10-31 MED ORDER — KETOROLAC TROMETHAMINE 30 MG/ML IJ SOLN
30.0000 mg | Freq: Once | INTRAMUSCULAR | Status: AC
  Administered 2019-10-31: 30 mg via INTRAMUSCULAR

## 2019-10-31 MED ORDER — KETOROLAC TROMETHAMINE 30 MG/ML IJ SOLN
INTRAMUSCULAR | Status: AC
  Filled 2019-10-31: qty 1

## 2019-10-31 MED ORDER — PREDNISONE 10 MG (21) PO TBPK
ORAL_TABLET | Freq: Every day | ORAL | 0 refills | Status: DC
Start: 2019-10-31 — End: 2020-07-11

## 2019-10-31 NOTE — ED Triage Notes (Signed)
Pt c/o 8/10 stabbing pain in lower backx2 days. Pt states he can't straighten his back. Pt denies numbness and tingling. Pt walked hunched over to exam room.

## 2019-11-02 NOTE — ED Provider Notes (Signed)
[place]   [id number] 10/31/19 Arrival Time: 7867  ASSESSMENT & PLAN:  1. Acute right-sided low back pain without sciatica      Able to ambulate here and hemodynamically stable. No indication for imaging of back at this time given no trauma and normal neurological exam. Discussed.  Meds ordered this encounter  Medications  . ketorolac (TORADOL) 30 MG/ML injection 30 mg  . predniSONE (STERAPRED UNI-PAK 21 TAB) 10 MG (21) TBPK tablet    Sig: Take by mouth daily. Take as directed.    Dispense:  21 tablet    Refill:  0    Encourage ROM/movement as tolerated.  Recommend: Follow-up Information    Shirline Frees, MD.   Specialty: Family Medicine Why: If worsening or failing to improve as anticipated. Contact information: [place] [address] [phone]           Reviewed expectations re: course of current medical issues. Questions answered. Outlined signs and symptoms indicating need for more acute intervention. Patient verbalized understanding. After Visit Summary given.   SUBJECTIVE: History from: patient.  Ryan Wagner is a 74 y.o. male who presents with complaint of intermittent right sided lower back discomfort. Onset gradual. First noted approx 2 d ago. Injury/trama: no. History of back problems requiring medical care: occasional. Pain described as aching and without radiation. Aggravating factors: certain movements and prolonged walking/standing. Alleviating factors: rest. Progressive LE weakness or saddle anesthesia: none. Extremity sensation changes or weakness: none. Ambulatory without difficulty. Normal bowel/bladder habits: yes; without urinary retention. Normal PO intake without n/v. No associated abdominal pain/n/v. Self treatment: has has not tried OTC therapies.  Reports no chronic steroid use, fevers, IV drug use, or recent back surgeries or procedures.  ROS: As per HPI. All other systems  negative.   OBJECTIVE:  Vitals:   10/31/19 1616  BP: (!) 158/76  Pulse: 75  Resp: 18  Temp: 97.7 F (36.5 C)  TempSrc: Oral  SpO2: 97%  Weight: 88.5 kg  Height: 5\' 7"  (1.702 m)    General appearance: alert; no distress HEENT: [place]; AT Neck: supple with FROM; without midline tenderness CV: regular Lungs: unlabored respirations; speaks full sentences without difficulty Abdomen: soft, non-tender; non-distended Back: mild , moderate and poorly localized tenderness to palpation over R SI joint and lumbar musculature; FROM at waist; bruising: none; without midline tenderness Extremities: without edema; symmetrical without gross deformities; normal ROM of bilateral LE Skin: warm and dry Neurologic: normal gait; normal sensation and strength of bilateral LE Psychological: alert and cooperative; normal mood and affect   Allergies  Allergen Reactions  . Ace Inhibitors Cough    Past Medical History:  Diagnosis Date  . Arthritis    knees, hands  . Atrial fibrillation ([place])   . CAD (coronary artery disease)    a. prior MI in 2004 with DESx3 to RCA  . Complication of anesthesia    had difficulty voiding after hemorrhoidectomy  . Diabetes mellitus without complication ([place])    type 2  . GERD (gastroesophageal reflux disease)    no longer with symptoms  . Headache    migraines as a child  . Hyperlipidemia   . Hypertension   . Sciatica    improved   Social History   Socioeconomic History  . Marital status: Married    Spouse name: Not on file  . Number of children: Not on file  . Years of education: Not on file  . Highest education level:  Not on file  Occupational History  . Not on file  Tobacco Use  . Smoking status: Never Smoker  . Smokeless tobacco: Never Used  Substance and Sexual Activity  . Alcohol use: Yes    Comment: occasional  . Drug use: No  . Sexual activity: Not on file  Other Topics Concern  . Not on file  Social History Narrative  . Not on file    Social Determinants of Health   Financial Resource Strain:   . Difficulty of Paying Living Expenses:   Food Insecurity: No Food Insecurity  . Worried About Charity fundraiser in the Last Year: Never true  . Ran Out of Food in the Last Year: Never true  Transportation Needs: No Transportation Needs  . Lack of Transportation (Medical): No  . Lack of Transportation (Non-Medical): No  Physical Activity:   . Days of Exercise per Week:   . Minutes of Exercise per Session:   Stress:   . Feeling of Stress :   Social Connections:   . Frequency of Communication with Friends and Family:   . Frequency of Social Gatherings with Friends and Family:   . Attends Religious Services:   . Active Member of Clubs or Organizations:   . Attends Archivist Meetings:   [name] Marital Status:   Intimate Partner Violence:   . Fear of Current or Ex-Partner:   . Emotionally Abused:   [name] Physically Abused:   . Sexually Abused:    Family History  Problem Relation Age of Onset  . Dementia Mother   . Thyroid disease Mother   . Dementia Father   . High Cholesterol Father    Past Surgical History:  Procedure Laterality Date  . ADENOIDECTOMY    . ANTERIOR CERVICAL DECOMP/DISCECTOMY FUSION N/A 04/15/2018   Procedure: Cervical Four-Five Cervical Five-Six Anterior cervical decompression/discectomy/fusion;  Surgeon: Judith Part, MD;  Location: [place];  Service: Neurosurgery;  Laterality: N/A;  anterior approach  . APPENDECTOMY    . BAND HEMORRHOIDECTOMY    . CARDIAC CATHETERIZATION    . CLIPPING OF ATRIAL APPENDAGE N/A 06/11/2018   Procedure: CLIPPING OF LEFT ATRIAL APPENDAGE using a 35 AtriCure Clip;  Surgeon: Grace Isaac, MD;  Location: [place];  Service: Open Heart Surgery;  Laterality: N/A;  . COLONOSCOPY    . CORONARY ANGIOPLASTY  2004  . CORONARY ARTERY BYPASS GRAFT N/A 06/11/2018   Procedure: CORONARY ARTERY BYPASS GRAFTING (CABG) x4 using left internal mammary artery and right  greater saphenous vein harvested endoscopically. LIMA to distal LAD, Seq SVG to 1st & 2nd OM, SVG to distal PD;  Surgeon: Grace Isaac, MD;  Location: [facility];  Service: Open Heart Surgery;  Laterality: N/A;  . ESOPHAGEAL DILATION    . KNEE SURGERY Left   . LEFT HEART CATH AND CORONARY ANGIOGRAPHY N/A 06/10/2018   Procedure: LEFT HEART CATH AND CORONARY ANGIOGRAPHY;  Surgeon: Troy Sine, MD;  Location: [facility];  Service: Cardiovascular;  Laterality: N/A;  . LIPOMA EXCISION     neck  . MAZE N/A 06/11/2018   Procedure: Pulmonary isolation MAZE using bipolar ablation;  Surgeon: Grace Isaac, MD;  Location: [place] [facility];  Service: Open Heart Surgery;  Laterality: N/A;  . TEE WITHOUT CARDIOVERSION N/A 06/11/2018   Procedure: TRANSESOPHAGEAL ECHOCARDIOGRAM (TEE);  Surgeon: Grace Isaac, MD;  Location: [facility];  Service: Open Heart Surgery;  Laterality: N/A;  . TONSILLECTOMY    . TOTAL KNEE ARTHROPLASTY Left  06/01/2014   Procedure: TOTAL KNEE ARTHROPLASTY;  Surgeon: Hessie Dibble, MD;  Location: [facility];  Service: Orthopedics;  Laterality: Left;     Vanessa Kick, MD 11/02/19 1017

## 2019-11-23 DIAGNOSIS — E782 Mixed hyperlipidemia: Secondary | ICD-10-CM | POA: Diagnosis not present

## 2019-11-23 DIAGNOSIS — I480 Paroxysmal atrial fibrillation: Secondary | ICD-10-CM | POA: Diagnosis not present

## 2019-11-23 DIAGNOSIS — E119 Type 2 diabetes mellitus without complications: Secondary | ICD-10-CM | POA: Diagnosis not present

## 2019-11-23 DIAGNOSIS — M179 Osteoarthritis of knee, unspecified: Secondary | ICD-10-CM | POA: Diagnosis not present

## 2019-11-23 DIAGNOSIS — I10 Essential (primary) hypertension: Secondary | ICD-10-CM | POA: Diagnosis not present

## 2019-11-26 DIAGNOSIS — M1611 Unilateral primary osteoarthritis, right hip: Secondary | ICD-10-CM | POA: Diagnosis not present

## 2019-11-26 DIAGNOSIS — M25551 Pain in right hip: Secondary | ICD-10-CM | POA: Diagnosis not present

## 2019-12-10 ENCOUNTER — Telehealth: Payer: Self-pay

## 2019-12-10 NOTE — Telephone Encounter (Signed)
   Primary Cardiologist:Thomas Claiborne Billings, MD  Chart reviewed as part of pre-operative protocol coverage. Because of CREEK GAN past medical history and time since last visit, they will require a follow-up visit in order to better assess preoperative cardiovascular risk.  Pre-op covering staff: - Please schedule appointment and call patient to inform them.  - Please contact requesting surgeon's office via preferred method (i.e, phone, fax) to inform them of need for appointment prior to surgery.  If applicable, this message will also be routed to pharmacy pool and/or primary cardiologist for input on holding anticoagulant/antiplatelet agent as requested below so that this information is available to the clearing provider at time of patient's appointment.   Abigail Butts, PA-C  12/10/2019, 12:31 PM

## 2019-12-10 NOTE — Telephone Encounter (Signed)
Will wait for MD input regarding continuation of Eliquis before providing clearance.

## 2019-12-10 NOTE — Telephone Encounter (Signed)
Called and spoke with Sharene Butters, she states that when pt had open heart surgery "they did some sort for re-routing and he wouldn't have Afib anymore so he could stop the eliquis"

## 2019-12-10 NOTE — Telephone Encounter (Signed)
   [facility] Pre-operative Risk Assessment    Request for surgical clearance:  1. What type of surgery is being performed? TOTAL Right HIP ARTHROPLASTY ANTERIOR APPROACH  2. When is this surgery scheduled? 01-26-2020   3. What type of clearance is required (medical clearance vs. Pharmacy clearance to hold med vs. Both)? BOTH  4. Are there any medications that need to be held prior to surgery and how long? ASPIRIN   5. Practice name and name of physician performing surgery? [facility]  6. What is the office phone number? [phone]   7.   What is the office fax number? [phone]  8.   Anesthesia type (None, local, MAC, general) ? Spinal  Waylan Rocher 12/10/2019, 12:09 PM

## 2019-12-11 DIAGNOSIS — M25551 Pain in right hip: Secondary | ICD-10-CM | POA: Diagnosis not present

## 2019-12-15 DIAGNOSIS — I480 Paroxysmal atrial fibrillation: Secondary | ICD-10-CM | POA: Diagnosis not present

## 2019-12-15 DIAGNOSIS — I2510 Atherosclerotic heart disease of native coronary artery without angina pectoris: Secondary | ICD-10-CM | POA: Diagnosis not present

## 2019-12-15 DIAGNOSIS — E782 Mixed hyperlipidemia: Secondary | ICD-10-CM | POA: Diagnosis not present

## 2019-12-15 DIAGNOSIS — E119 Type 2 diabetes mellitus without complications: Secondary | ICD-10-CM | POA: Diagnosis not present

## 2019-12-15 DIAGNOSIS — I10 Essential (primary) hypertension: Secondary | ICD-10-CM | POA: Diagnosis not present

## 2019-12-15 DIAGNOSIS — M179 Osteoarthritis of knee, unspecified: Secondary | ICD-10-CM | POA: Diagnosis not present

## 2019-12-15 NOTE — Telephone Encounter (Signed)
Hold eliquis for 48 hrs prior to surgery

## 2019-12-16 NOTE — Telephone Encounter (Signed)
Primary Cardiologist:Thomas Claiborne Billings, MD  Chart reviewed as part of pre-operative protocol coverage. Because of Ryan Wagner past medical history and time since last visit, he/she will require a follow-up visit in order to better assess preoperative cardiovascular risk.  Pre-op covering staff: - Please schedule appointment and call patient to inform them. - Please contact requesting surgeon's office via preferred method (i.e, phone, fax) to inform them of need for appointment prior to surgery.  If applicable, this message will also be routed to pharmacy pool and/or primary cardiologist for input on holding anticoagulant/antiplatelet agent as requested below so that this information is available at time of patient's appointment.   Deberah Pelton, NP  12/16/2019, 7:45 AM

## 2019-12-16 NOTE — Telephone Encounter (Signed)
LM2CB needs appt for pre-op clearance 

## 2019-12-16 NOTE — Telephone Encounter (Signed)
Routed to requesting party via [place] fax function

## 2019-12-22 NOTE — Telephone Encounter (Signed)
Patient has appointment for surgical clearance 01/06/20.

## 2020-01-06 ENCOUNTER — Other Ambulatory Visit: Payer: Self-pay

## 2020-01-06 ENCOUNTER — Ambulatory Visit: Payer: HMO | Admitting: Physician Assistant

## 2020-01-06 VITALS — BP 150/77 | HR 60 | Temp 96.8°F | Ht 67.0 in | Wt 200.8 lb

## 2020-01-06 DIAGNOSIS — Z01818 Encounter for other preprocedural examination: Secondary | ICD-10-CM | POA: Diagnosis not present

## 2020-01-06 DIAGNOSIS — I10 Essential (primary) hypertension: Secondary | ICD-10-CM

## 2020-01-06 DIAGNOSIS — I480 Paroxysmal atrial fibrillation: Secondary | ICD-10-CM

## 2020-01-06 DIAGNOSIS — I25810 Atherosclerosis of coronary artery bypass graft(s) without angina pectoris: Secondary | ICD-10-CM | POA: Diagnosis not present

## 2020-01-06 DIAGNOSIS — E785 Hyperlipidemia, unspecified: Secondary | ICD-10-CM

## 2020-01-06 DIAGNOSIS — E119 Type 2 diabetes mellitus without complications: Secondary | ICD-10-CM | POA: Diagnosis not present

## 2020-01-06 NOTE — Progress Notes (Signed)
Cardiology Office Note:    Date:  01/08/2020   ID:  Ryan Wagner, DOB [birth year], MRN [id number]  PCP:  Shirline Frees, MD  [facility] Cardiologist:  Shelva Majestic, MD  [facility] Electrophysiologist:  None   Referring MD: Shirline Frees, MD   Chief Complaint  Patient presents with   Pre-op Exam    seen for Dr. Claiborne Billings, upcoming surgery by Dr. Alvan Dame    History of Present Illness:    Ryan Wagner is a 74 y.o. male with a hx of CAD s/p CABG x 4 (LIMA-LAD, sequential SVG-OM1-OM 2, SVG-PDA) by Dr. [name] in Jan 2020, DM 2, hypertension, hyperlipidemia, and history of PAF.  Patient initially had inferior wall MI in June 2004 and received 3 drug-eluting stents to the RCA.  He was found to have moderate to severe stenosis in obtuse marginal branch.  He underwent repeat cardiac catheterization that revealed diffuse disease in the left circumflex vessel not amenable to PCI, medical therapy was recommended.  He previously noted short episodes of atrial fibrillation apple watch.  He was started on Eliquis 5 mg twice daily afterward. He also had a Maze procedure and atrial clipping for his atrial fibrillation.  He recovered well however developed recurrent PAF the day prior to discharge.  He was later placed on short course of amiodarone therapy.  Patient presents today for 43-month follow-up and preoperative clearance. He has upcoming hip surgery by Dr. Alvan Dame. He denies any recent chest pain or shortness of breath. Since his last ischemic evaluation was slightly over a year ago, without any anginal symptom, I think he would be a good candidate to proceed with surgery. He is able to do every day activity however not able to do more strenuous activity due to hip issue. Interestingly, he says Dr. Claiborne Billings took a month off of Eliquis last December therefore he is no longer on Eliquis even though this is still listed on his medication list. However I am unable to confirm that Dr. Claiborne Billings decided  to discontinue the Eliquis. Dr. Evette Georges note only mentioned holding Eliquis for 3 days prior to back injection however there is no mention will permanently discontinue the Eliquis. I will need to verify this with Dr. Claiborne Billings. Otherwise, I would prefer him to continue on the aspirin through the surgery. His most recent lipid panel obtained in November 2020 showed low HDL, but well controlled her total cholesterol, LDL and the triglyceride. He says he did not exercise much last year due to COVID-19 pandemic however  he just got his gym membership back and able to increase activity level after the surgery.  Past Medical History:  Diagnosis Date   Arthritis    knees, hands   Atrial fibrillation (HCC)    CAD (coronary artery disease)    a. prior MI in 2004 with DESx3 to RCA   Complication of anesthesia    had difficulty voiding after hemorrhoidectomy   Diabetes mellitus without complication ([place])    type 2   GERD (gastroesophageal reflux disease)    no longer with symptoms   Headache    migraines as a child   Hyperlipidemia    Hypertension    Sciatica    improved    Past Surgical History:  Procedure Laterality Date   ADENOIDECTOMY     ANTERIOR CERVICAL DECOMP/DISCECTOMY FUSION N/A 04/15/2018   Procedure: Cervical Four-Five Cervical Five-Six Anterior cervical decompression/discectomy/fusion;  Surgeon: Judith Part, MD;  Location: [place];  Service: Neurosurgery;  Laterality:  N/A;  anterior approach   APPENDECTOMY     BAND HEMORRHOIDECTOMY     CARDIAC CATHETERIZATION     CLIPPING OF ATRIAL APPENDAGE N/A 06/11/2018   Procedure: CLIPPING OF LEFT ATRIAL APPENDAGE using a 35 AtriCure Clip;  Surgeon: Grace Isaac, MD;  Location: [place];  Service: Open Heart Surgery;  Laterality: N/A;   COLONOSCOPY     CORONARY ANGIOPLASTY  2004   CORONARY ARTERY BYPASS GRAFT N/A 06/11/2018   Procedure: CORONARY ARTERY BYPASS GRAFTING (CABG) x4 using left internal mammary artery and  right greater saphenous vein harvested endoscopically. LIMA to distal LAD, Seq SVG to 1st & 2nd OM, SVG to distal PD;  Surgeon: Grace Isaac, MD;  Location: [place];  Service: Open Heart Surgery;  Laterality: N/A;   ESOPHAGEAL DILATION     KNEE SURGERY Left    LEFT HEART CATH AND CORONARY ANGIOGRAPHY N/A 06/10/2018   Procedure: LEFT HEART CATH AND CORONARY ANGIOGRAPHY;  Surgeon: Troy Sine, MD;  Location: [place] [facility];  Service: Cardiovascular;  Laterality: N/A;   LIPOMA EXCISION     neck   MAZE N/A 06/11/2018   Procedure: Pulmonary isolation MAZE using bipolar ablation;  Surgeon: Grace Isaac, MD;  Location: [place];  Service: Open Heart Surgery;  Laterality: N/A;   TEE WITHOUT CARDIOVERSION N/A 06/11/2018   Procedure: TRANSESOPHAGEAL ECHOCARDIOGRAM (TEE);  Surgeon: Grace Isaac, MD;  Location: [place];  Service: Open Heart Surgery;  Laterality: N/A;   TONSILLECTOMY     TOTAL KNEE ARTHROPLASTY Left 06/01/2014   Procedure: TOTAL KNEE ARTHROPLASTY;  Surgeon: Hessie Dibble, MD;  Location: [place];  Service: Orthopedics;  Laterality: Left;    Current Medications: Current Meds  Medication Sig   acetaminophen (TYLENOL) 325 MG tablet Take 2 tablets (650 mg total) by mouth every 6 (six) hours as needed for mild pain.   amLODipine (NORVASC) 10 MG tablet Take 1 tablet by mouth once daily   aspirin EC 81 MG EC tablet Take 1 tablet (81 mg total) by mouth daily.   atorvastatin (LIPITOR) 80 MG tablet Take 1 tablet (80 mg total) by mouth daily.   Cholecalciferol (VITAMIN D3 PO) Take 1 capsule by mouth daily.   Coenzyme Q10 (CO Q 10) 100 MG CAPS Take 100 mg by mouth daily.    ELIQUIS 5 MG TABS tablet Take 1 tablet by mouth twice daily   esomeprazole (NEXIUM) 20 MG capsule Take 20 mg by mouth daily.    gabapentin (NEURONTIN) 100 MG capsule Take 100 mg by mouth 3 (three) times daily.   Krill Oil 500 MG CAPS Take 500 mg by mouth daily.   L-Arginine 1000 MG TABS Take 1  tablet by mouth daily. L-Arginine 1000 mg / L-Citrulline    latanoprost (XALATAN) 0.005 % ophthalmic solution Place 1 drop into both eyes at bedtime.   Liraglutide (VICTOZA) 18 MG/3ML SOLN Inject 1.2 mg into the skin daily.    losartan (COZAAR) 100 MG tablet Take 1 tablet (100 mg total) by mouth daily.   MAGNESIUM GLUCONATE PO Take 150 mg by mouth daily.   Menaquinone-7 (VITAMIN K2 PO) Take 1 capsule by mouth daily.   metFORMIN (GLUCOPHAGE) 500 MG tablet Take 500 mg by mouth 2 (two) times daily with a meal.   metoprolol tartrate (LOPRESSOR) 25 MG tablet Take 1 tablet by mouth twice daily   Multiple Vitamin (MULTIVITAMIN) tablet Take 1 tablet by mouth daily.   predniSONE (STERAPRED UNI-PAK 21 TAB) 10 MG (  21) TBPK tablet Take by mouth daily. Take as directed.   Turmeric 500 MG CAPS Take 500 mg by mouth daily.   Zinc 50 MG CAPS Take 1 capsule by mouth daily.     Allergies:   Ace inhibitors   Social History   Socioeconomic History   Marital status: Married    Spouse name: Not on file   Number of children: Not on file   Years of education: Not on file   Highest education level: Not on file  Occupational History   Not on file  Tobacco Use   Smoking status: Never Smoker   Smokeless tobacco: Never Used  Vaping Use   Vaping Use: Never used  Substance and Sexual Activity   Alcohol use: Yes    Comment: occasional   Drug use: No   Sexual activity: Not on file  Other Topics Concern   Not on file  Social History Narrative   Not on file   Social Determinants of Health   Financial Resource Strain:    Difficulty of Paying Living Expenses:   Food Insecurity: No Food Insecurity   Worried About [place] in the Last Year: Never true   Ran Out of Food in the Last Year: Never true  Transportation Needs: No Transportation Needs   Lack of Transportation (Medical): No   Lack of Transportation (Non-Medical): No  Physical Activity:    Days of Exercise  per Week:    Minutes of Exercise per Session:   Stress:    Feeling of Stress :   Social Connections:    Frequency of Communication with Friends and Family:    Frequency of Social Gatherings with Friends and Family:    Attends Religious Services:    Active Member of Clubs or Organizations:    Attends Archivist Meetings:    Marital Status:      Family History: The patient's family history includes Dementia in his father and mother; High Cholesterol in his father; Thyroid disease in his mother.  ROS:   Please see the history of present illness.     All other systems reviewed and are negative.  EKGs/Labs/Other Studies Reviewed:    The following studies were reviewed today:  Cath 06/10/2018  Prox RCA to Mid RCA lesion is 60% stenosed.  Mid RCA-1 lesion is 45% stenosed.  Mid RCA-2 lesion is 50% stenosed.  Mid RCA to Dist RCA lesion is 55% stenosed.  Dist RCA lesion is 80% stenosed.  Ost RPDA to RPDA lesion is 10% stenosed.  RPDA lesion is 80% stenosed.  Ost 1st Mrg to 1st Mrg lesion is 90% stenosed.  Mid Cx to Dist Cx lesion is 90% stenosed.  Ost Cx to Prox Cx lesion is 80% stenosed.  Ost 1st Diag lesion is 95% stenosed.  Mid LAD lesion is 80% stenosed.  Dist LAD lesion is 99% stenosed.  The left ventricular ejection fraction is 45-50% by visual estimate.   There is evidence for significant cardiac calcification with ectatic vessels and multivessel CAD.  The LAD is calcified and has 95% diagonal 1 stenosis, diffuse 80% mid lady stenosis following an ectatic proximal segment and subtotal distal stenosis.  The left circumflex vessel is a very large caliber vessel with diffuse 80% ostial to proximal stenosis.  There is diffuse 90% stenoses in an OM vessel as well as distal OM/posterolateral like vessel.  The RCA is ectatic and has proximal and mid 60% stenoses.  There is diffuse intimal hyperplasia with  50 to 60% narrowing in tandem stents in  the distal RCA and a small region of 80% stenosis proximal to the stent in the proximal PDA.  The mid PDA has diffuse 80% stenosis.  Mild LV dysfunction with an EF of approximately 45% with focal apical akinesis.  There is mild mid anterolateral and basal posterior hypocontractility.  RECOMMENDATION: Surgical consultation for consideration of CABG revascularization surgery.  At present, reinstitution of Eliquis will be held.  The proximal circumflex is extremely large in caliber and larger than any stents which are available in lab.   TEE 06/11/2018 Septum: No Patent Foramen Ovale present.   Left atrium: Patent foramen ovale not present.   Aortic valve: No stenosis. Trace regurgitation. No AV vegetation.   Mitral valve: No leaflet thickening and calcification present. Trace  regurgitation.   Right ventricle: Normal cavity size, wall thickness and ejection  fraction. No thrombus present. No mass present.   Pulmonic valve: Trace regurgitation.     EKG:  EKG is ordered today.  The ekg ordered today demonstrates normal sinus rhythm, no significant ST-T wave changes  Recent Labs: No results found for requested labs within last 8760 hours.  Recent Lipid Panel No results found for: CHOL, TRIG, HDL, CHOLHDL, VLDL, LDLCALC, LDLDIRECT  Physical Exam:    VS:  BP (!) 150/77    Pulse 60    Temp (!) 96.8 F (36 C)    Ht 5\' 7"  (1.702 m)    Wt 200 lb 12.8 oz (91.1 kg)    SpO2 98%    BMI 31.45 kg/m     Wt Readings from Last 3 Encounters:  01/06/20 200 lb 12.8 oz (91.1 kg)  10/31/19 195 lb (88.5 kg)  04/30/19 193 lb (87.5 kg)     GEN:  Well nourished, well developed in no acute distress HEENT: Normal NECK: No JVD; No carotid bruits LYMPHATICS: No lymphadenopathy CARDIAC: RRR, no murmurs, rubs, gallops RESPIRATORY:  Clear to auscultation without rales, wheezing or rhonchi  ABDOMEN: Soft, non-tender, non-distended MUSCULOSKELETAL:  No edema; No deformity  SKIN: Warm and  dry NEUROLOGIC:  Alert and oriented x 3 PSYCHIATRIC:  Normal affect   ASSESSMENT:    1. Pre-op evaluation   2. PAF (paroxysmal atrial fibrillation) ([place])   3. Coronary artery disease involving coronary bypass graft of native heart without angina pectoris   4. Essential hypertension   5. Hyperlipidemia LDL goal <70   6. Controlled type 2 diabetes mellitus without complication, without long-term current use of insulin (HCC)    PLAN:    In order of problems listed above:  1. Preoperative clearance: Patient has upcoming procedure by Dr. Alvan Dame.  His previous cardiac catheterization was in January 2020.  He denies any recent chest pain or shortness of breath.  He is stable from cardiac perspective to proceed with surgery.  He is no longer on Eliquis.  I recommend he continue aspirin through the surgery.  2. PAF: He has a history of atrial fibrillation, however he says he was told by Dr. Claiborne Billings to discontinue Eliquis in December 2020, I am unable to confirm this on the previous office note.  Dr. Evette Georges note only mentions he should hold the Eliquis for 3 days prior to a surgery.  I will need to check with Dr. Claiborne Billings.  Fortunately patient has had atrial appendage clipping and maze procedure before, therefore his stroke risk is fairly low.  Previous records suggested even after the previous Maze procedure and atrial clipping, he continued  to have recurrent A. Fib.   3. CAD s/p CABG: Denies any chest pain.  4. Hypertension: Blood pressure stable  5. Hyperlipidemia: Continue on Lipitor  6. DM2: Managed by primary care provider.   Medication Adjustments/Labs and Tests Ordered: Current medicines are reviewed at length with the patient today.  Concerns regarding medicines are outlined above.  Orders Placed This Encounter  Procedures   EKG 12-Lead   No orders of the defined types were placed in this encounter.   Patient Instructions  Medication Instructions:  Your physician recommends that  you continue on your current medications as directed. Please refer to the Current Medication list given to you today.  *If you need a refill on your cardiac medications before your next appointment, please call your pharmacy*  Lab Work: NONE ordered at this time of appointment   If you have labs (blood work) drawn today and your tests are completely normal, you will receive your results only by:  [place] (if you have MyChart) OR  A paper copy in the mail If you have any lab test that is abnormal or we need to change your treatment, we will call you to review the results.  Testing/Procedures: NONE ordered at this time of appointment   Follow-Up: At [facility], you and your health needs are our priority.  As part of our continuing mission to provide you with exceptional heart care, we have created designated Provider Care Teams.  These Care Teams include your primary Cardiologist (physician) and Advanced Practice Providers (APPs -  Physician Assistants and Nurse Practitioners) who all work together to provide you with the care you need, when you need it.  Your next appointment:   6 month(s)  The format for your next appointment:   In Person  Provider:   Shelva Majestic, MD  Other Instructions     Signed, Almyra Deforest, [place]  01/08/2020 11:06 PM    [place]

## 2020-01-06 NOTE — Patient Instructions (Signed)
Medication Instructions:  °Your physician recommends that you continue on your current medications as directed. Please refer to the Current Medication list given to you today. ° °*If you need a refill on your cardiac medications before your next appointment, please call your pharmacy* ° °Lab Work: °NONE ordered at this time of appointment  ° °If you have labs (blood work) drawn today and your tests are completely normal, you will receive your results only by: °MyChart Message (if you have MyChart) OR °A paper copy in the mail °If you have any lab test that is abnormal or we need to change your treatment, we will call you to review the results. ° °Testing/Procedures: °NONE ordered at this time of appointment  ° °Follow-Up: °At [facility], you and your health needs are our priority.  As part of our continuing mission to provide you with exceptional heart care, we have created designated Provider Care Teams.  These Care Teams include your primary Cardiologist (physician) and Advanced Practice Providers (APPs -  Physician Assistants and Nurse Practitioners) who all work together to provide you with the care you need, when you need it. ° °Your next appointment:   °6 month(s) ° °The format for your next appointment:   °In Person ° °Provider:   °Thomas Kelly, MD   ° °Other Instructions ° ° °

## 2020-01-08 ENCOUNTER — Encounter: Payer: Self-pay | Admitting: Physician Assistant

## 2020-01-10 DIAGNOSIS — Z20828 Contact with and (suspected) exposure to other viral communicable diseases: Secondary | ICD-10-CM | POA: Diagnosis not present

## 2020-01-14 ENCOUNTER — Other Ambulatory Visit: Payer: Self-pay | Admitting: Physician Assistant

## 2020-01-14 DIAGNOSIS — Z794 Long term (current) use of insulin: Secondary | ICD-10-CM

## 2020-01-14 DIAGNOSIS — E119 Type 2 diabetes mellitus without complications: Secondary | ICD-10-CM

## 2020-01-14 DIAGNOSIS — I480 Paroxysmal atrial fibrillation: Secondary | ICD-10-CM

## 2020-01-14 DIAGNOSIS — I2510 Atherosclerotic heart disease of native coronary artery without angina pectoris: Secondary | ICD-10-CM

## 2020-01-14 DIAGNOSIS — U071 COVID-19: Secondary | ICD-10-CM

## 2020-01-14 DIAGNOSIS — I10 Essential (primary) hypertension: Secondary | ICD-10-CM

## 2020-01-14 NOTE — Patient Instructions (Addendum)
DUE TO COVID-19 ONLY ONE VISITOR IS ALLOWED TO COME WITH YOU AND STAY IN THE WAITING ROOM ONLY DURING PRE OP AND PROCEDURE DAY OF SURGERY. THE 1 VISITOR  MAY VISIT WITH YOU AFTER SURGERY IN YOUR PRIVATE ROOM DURING VISITING HOURS ONLY!  YOU NEED TO HAVE A COVID 19 TEST ON: 01/22/20 @             , THIS TEST MUST BE DONE BEFORE SURGERY,  COVID TESTING SITE [place] [id number], IT IS ON THE RIGHT GOING OUT [address] APPROXIMATELY  2 MINUTES PAST [employer] ON THE RIGHT. ONCE YOUR COVID TEST IS COMPLETED,  PLEASE BEGIN THE QUARANTINE INSTRUCTIONS AS OUTLINED IN YOUR HANDOUT.                Ryan Wagner    Your procedure is scheduled on: 01/26/20   Report to [facility] Main  Entrance   Report to admitting at: 12:00 PM     Call this number if you have problems the morning of surgery [phone]    Remember:   NO SOLID FOOD AFTER MIDNIGHT THE NIGHT PRIOR TO SURGERY. NOTHING BY MOUTH EXCEPT CLEAR LIQUIDS UNTIL: 11:30 am . PLEASE FINISH GATORADE DRINK PER SURGEON ORDER  WHICH NEEDS TO BE COMPLETED AT: 11:30 am .  CLEAR LIQUID DIET   Foods Allowed                                                                     Foods Excluded  Coffee and tea, regular and decaf                             liquids that you cannot  Plain Jell-O any favor except red or purple                                           see through such as: Fruit ices (not with fruit pulp)                                     milk, soups, orange juice  Iced Popsicles                                    All solid food Carbonated beverages, regular and diet                                    Cranberry, grape and apple juices Sports drinks like Gatorade Lightly seasoned clear broth or consume(fat free) Sugar, honey syrup  Sample Menu Breakfast                                Lunch  Supper Cranberry juice                    Beef broth                             Chicken broth Jell-O                                     Grape juice                           Apple juice Coffee or tea                        Jell-O                                      Popsicle                                                Coffee or tea                        Coffee or tea  _____________________________________________________________________  BRUSH YOUR TEETH MORNING OF SURGERY AND RINSE YOUR MOUTH OUT, NO CHEWING GUM CANDY OR MINTS.     Take these medicines the morning of surgery with A SIP OF WATER: amlodipine,metoprolol,esomeprazole.Use eye drops as usual.  How to Manage Your Diabetes Before and After Surgery  Why is it important to control my blood sugar before and after surgery? . Improving blood sugar levels before and after surgery helps healing and can limit problems. . A way of improving blood sugar control is eating a healthy diet by: o  Eating less sugar and carbohydrates o  Increasing activity/exercise o  Talking with your doctor about reaching your blood sugar goals . High blood sugars (greater than 180 mg/dL) can raise your risk of infections and slow your recovery, so you will need to focus on controlling your diabetes during the weeks before surgery. . Make sure that the doctor who takes care of your diabetes knows about your planned surgery including the date and location.  How do I manage my blood sugar before surgery? . Check your blood sugar at least 4 times a day, starting 2 days before surgery, to make sure that the level is not too high or low. o Check your blood sugar the morning of your surgery when you wake up and every 2 hours until you get to the Short Stay unit. . If your blood sugar is less than 70 mg/dL, you will need to treat for low blood sugar: o Do not take insulin. o Treat a low blood sugar (less than 70 mg/dL) with  cup of clear juice (cranberry or apple), 4 glucose tablets, OR glucose gel. o Recheck blood  sugar in 15 minutes after treatment (to make sure it is greater than 70 mg/dL). If your blood sugar is not greater than 70 mg/dL on recheck, call [phone] for further instructions. . Report your blood sugar to the short stay nurse when you get to Short Stay.  [name]  If you are admitted to the hospital after surgery: o Your blood sugar will be checked by the staff and you will probably be given insulin after surgery (instead of oral diabetes medicines) to make sure you have good blood sugar levels. o The goal for blood sugar control after surgery is 80-180 mg/dL.   WHAT DO I DO ABOUT MY DIABETES MEDICATION?  [name] Do not take oral diabetes medicines (pills) the morning of surgery.  . THE DAY BEFORE SURGERY, take Metformin as usual. If you take Victosa in the morning,do it as usual. If you take Victosa at night DO NOT take it.    . THE MORNING OF SURGERY: DO NOT take Metforming.  . The day of surgery, do not take other diabetes injectables, including Byetta (exenatide), Bydureon (exenatide ER), Victoza (liraglutide), or Trulicity (dulaglutide).   DO NOT TAKE ANY DIABETIC MEDICATIONS DAY OF YOUR SURGERY                               You may not have any metal on your body including hair pins and              piercings  Do not wear jewelry, make-up, lotions, powders or perfumes, deodorant             Do not wear nail polish on your fingernails.  Do not shave  48 hours prior to surgery.              Men may shave face and neck.   Do not bring valuables to the hospital. [place].  Contacts, dentures or bridgework may not be worn into surgery.  Leave suitcase in the car. After surgery it may be brought to your room.     Patients discharged the day of surgery will not be allowed to drive home. IF YOU ARE HAVING SURGERY AND GOING HOME THE SAME DAY, YOU MUST HAVE AN ADULT TO DRIVE YOU HOME AND BE WITH YOU FOR 24 HOURS. YOU MAY GO HOME BY TAXI OR UBER  OR ORTHERWISE, BUT AN ADULT MUST ACCOMPANY YOU HOME AND STAY WITH YOU FOR 24 HOURS.  Name and phone number of your driver:  Special Instructions: N/A              Please read over the following fact sheets you were given: _____________________________________________________________________        [facility] - Preparing for Surgery Before surgery, you can play an important role.  Because skin is not sterile, your skin needs to be as free of germs as possible.  You can reduce the number of germs on your skin by washing with CHG (chlorahexidine gluconate) soap before surgery.  CHG is an antiseptic cleaner which kills germs and bonds with the skin to continue killing germs even after washing. Please DO NOT use if you have an allergy to CHG or antibacterial soaps.  If your skin becomes reddened/irritated stop using the CHG and inform your nurse when you arrive at Short Stay. Do not shave (including legs and underarms) for at least 48 hours prior to the first CHG shower.  You may shave your face/neck. Please follow these instructions carefully:  1.  Shower with CHG Soap the night before surgery and the  morning of Surgery.  2.  If you choose to wash your  hair, wash your hair first as usual with your  normal  shampoo.  3.  After you shampoo, rinse your hair and body thoroughly to remove the  shampoo.                           4.  Use CHG as you would any other liquid soap.  You can apply chg directly  to the skin and wash                       Gently with a scrungie or clean washcloth.  5.  Apply the CHG Soap to your body ONLY FROM THE NECK DOWN.   Do not use on face/ open                           Wound or open sores. Avoid contact with eyes, ears mouth and genitals (private parts).                       Wash face,  Genitals (private parts) with your normal soap.             6.  Wash thoroughly, paying special attention to the area where your surgery  will be performed.  7.  Thoroughly rinse your  body with warm water from the neck down.  8.  DO NOT shower/wash with your normal soap after using and rinsing off  the CHG Soap.                9.  Pat yourself dry with a clean towel.            10.  Wear clean pajamas.            11.  Place clean sheets on your bed the night of your first shower and do not  sleep with pets. Day of Surgery : Do not apply any lotions/deodorants the morning of surgery.  Please wear clean clothes to the hospital/surgery center.  FAILURE TO FOLLOW THESE INSTRUCTIONS MAY RESULT IN THE CANCELLATION OF YOUR SURGERY PATIENT SIGNATURE_________________________________  NURSE SIGNATURE__________________________________  ________________________________________________________________________   Adam Phenix  An incentive spirometer is a tool that can help keep your lungs clear and active. This tool measures how well you are filling your lungs with each breath. Taking long deep breaths may help reverse or decrease the chance of developing breathing (pulmonary) problems (especially infection) following:  A long period of time when you are unable to move or be active. BEFORE THE PROCEDURE   If the spirometer includes an indicator to show your best effort, your nurse or respiratory therapist will set it to a desired goal.  If possible, sit up straight or lean slightly forward. Try not to slouch.  Hold the incentive spirometer in an upright position. INSTRUCTIONS FOR USE  1. Sit on the edge of your bed if possible, or sit up as far as you can in bed or on a chair. 2. Hold the incentive spirometer in an upright position. 3. Breathe out normally. 4. Place the mouthpiece in your mouth and seal your lips tightly around it. 5. Breathe in slowly and as deeply as possible, raising the piston or the ball toward the top of the column. 6. Hold your breath for 3-5 seconds or for as long as possible. Allow the piston or ball to fall to the bottom of the  column. 7. Remove the mouthpiece from your mouth and breathe out normally. 8. Rest for a few seconds and repeat Steps 1 through 7 at least 10 times every 1-2 hours when you are awake. Take your time and take a few normal breaths between deep breaths. 9. The spirometer may include an indicator to show your best effort. Use the indicator as a goal to work toward during each repetition. 10. After each set of 10 deep breaths, practice coughing to be sure your lungs are clear. If you have an incision (the cut made at the time of surgery), support your incision when coughing by placing a pillow or rolled up towels firmly against it. Once you are able to get out of bed, walk around indoors and cough well. You may stop using the incentive spirometer when instructed by your caregiver.  RISKS AND COMPLICATIONS  Take your time so you do not get dizzy or light-headed.  If you are in pain, you may need to take or ask for pain medication before doing incentive spirometry. It is harder to take a deep breath if you are having pain. AFTER USE  Rest and breathe slowly and easily.  It can be helpful to keep track of a log of your progress. Your caregiver can provide you with a simple table to help with this. If you are using the spirometer at home, follow these instructions: [place] IF:   You are having difficultly using the spirometer.  You have trouble using the spirometer as often as instructed.  Your pain medication is not giving enough relief while using the spirometer.  You develop fever of 100.5 F (38.1 C) or higher. SEEK IMMEDIATE MEDICAL CARE IF:   You cough up bloody sputum that had not been present before.  You develop fever of 102 F (38.9 C) or greater.  You develop worsening pain at or near the incision site. MAKE SURE YOU:   Understand these instructions.  Will watch your condition.  Will get help right away if you are not doing well or get worse. Document Released:  09/24/2006 Document Revised: 08/06/2011 Document Reviewed: 11/25/2006 [facility] Patient Information 2014 [place], [place].   ________________________________________________________________________

## 2020-01-14 NOTE — Progress Notes (Signed)
I connected by phone with Ryan Wagner on 01/14/2020 at 4:16 PM to discuss the potential use of a new treatment for mild to moderate COVID-19 viral infection in non-hospitalized patients.  This patient is a 74 y.o. male that meets the FDA criteria for Emergency Use Authorization of COVID monoclonal antibody casirivimab/imdevimab.  Has a (+) direct SARS-CoV-2 viral test result  Has mild or moderate COVID-19   Is NOT hospitalized due to COVID-19  Is within 10 days of symptom onset  Has at least one of the high risk factor(s) for progression to severe COVID-19 and/or hospitalization as defined in EUA.  Specific high risk criteria : Cardiovascular disease or hypertension   DM   I have spoken and communicated the following to the patient or parent/caregiver regarding COVID monoclonal antibody treatment:  1. FDA has authorized the emergency use for the treatment of mild to moderate COVID-19 in adults and pediatric patients with positive results of direct SARS-CoV-2 viral testing who are 54 years of age and older weighing at least 40 kg, and who are at high risk for progressing to severe COVID-19 and/or hospitalization.  2. The significant known and potential risks and benefits of COVID monoclonal antibody, and the extent to which such potential risks and benefits are unknown.  3. Information on available alternative treatments and the risks and benefits of those alternatives, including clinical trials.  4. Patients treated with COVID monoclonal antibody should continue to self-isolate and use infection control measures (e.g., wear mask, isolate, social distance, avoid sharing personal items, clean and disinfect "high touch" surfaces, and frequent handwashing) according to CDC guidelines.   5. The patient or parent/caregiver has the option to accept or refuse COVID monoclonal antibody treatment.  After reviewing this information with the patient, The patient agreed to proceed with  receiving casirivimab\imdevimab infusion and will be provided a copy of the Fact sheet prior to receiving the infusion. [name] 01/14/2020 4:16 PM

## 2020-01-15 ENCOUNTER — Ambulatory Visit (HOSPITAL_COMMUNITY)
Admission: RE | Admit: 2020-01-15 | Discharge: 2020-01-15 | Disposition: A | Discharge status: Home / Self Care | Payer: Medicare Other | Source: Ambulatory Visit | Attending: Pulmonary Disease | Admitting: Pulmonary Disease

## 2020-01-15 DIAGNOSIS — U071 COVID-19: Secondary | ICD-10-CM | POA: Insufficient documentation

## 2020-01-15 DIAGNOSIS — Z23 Encounter for immunization: Secondary | ICD-10-CM | POA: Insufficient documentation

## 2020-01-15 MED ORDER — SODIUM CHLORIDE 0.9 % IV SOLN: INTRAVENOUS | Status: DC | PRN

## 2020-01-15 MED ORDER — EPINEPHRINE 0.3 MG/0.3ML IJ SOAJ: 0.3000 mg | Freq: Once | INTRAMUSCULAR | Status: DC | PRN

## 2020-01-15 MED ORDER — DIPHENHYDRAMINE HCL 50 MG/ML IJ SOLN: 50.0000 mg | Freq: Once | INTRAMUSCULAR | Status: DC | PRN

## 2020-01-15 MED ORDER — ALBUTEROL SULFATE HFA 108 (90 BASE) MCG/ACT IN AERS: 2.0000 | INHALATION_SPRAY | Freq: Once | RESPIRATORY_TRACT | Status: DC | PRN

## 2020-01-15 MED ORDER — METHYLPREDNISOLONE SODIUM SUCC 125 MG IJ SOLR: 125.0000 mg | Freq: Once | INTRAMUSCULAR | Status: DC | PRN

## 2020-01-15 MED ORDER — SODIUM CHLORIDE 0.9 % IV SOLN
1200.0000 mg | Freq: Once | INTRAVENOUS | Status: AC
  Administered 2020-01-15: 1200 mg via INTRAVENOUS
  Filled 2020-01-15: qty 10

## 2020-01-15 MED ORDER — FAMOTIDINE IN NACL 20-0.9 MG/50ML-% IV SOLN: 20.0000 mg | Freq: Once | INTRAVENOUS | Status: DC | PRN

## 2020-01-15 NOTE — Progress Notes (Signed)
  Diagnosis: COVID-19  Physician: Dr. Asencion Noble  Procedure: [facility] Med: casirivimab\imdevimab infusion - Provided patient with casirivimab\imdevimab fact sheet for patients, parents and caregivers prior to infusion.  Complications: No immediate complications noted.  Discharge: Discharged home   Janine Ores 01/15/2020

## 2020-01-15 NOTE — Discharge Instructions (Signed)

## 2020-01-18 ENCOUNTER — Encounter (HOSPITAL_COMMUNITY)
Admission: RE | Admit: 2020-01-18 | Discharge: 2020-01-18 | Disposition: A | Discharge status: Home / Self Care | Payer: HMO | Source: Ambulatory Visit | Attending: Anesthesiology | Admitting: Anesthesiology

## 2020-01-25 ENCOUNTER — Encounter (HOSPITAL_COMMUNITY): Payer: HMO

## 2020-01-26 ENCOUNTER — Other Ambulatory Visit: Payer: Self-pay | Admitting: Cardiovascular Disease

## 2020-01-26 DIAGNOSIS — E782 Mixed hyperlipidemia: Secondary | ICD-10-CM | POA: Diagnosis not present

## 2020-01-26 DIAGNOSIS — Z7984 Long term (current) use of oral hypoglycemic drugs: Secondary | ICD-10-CM | POA: Diagnosis not present

## 2020-01-26 DIAGNOSIS — M179 Osteoarthritis of knee, unspecified: Secondary | ICD-10-CM | POA: Diagnosis not present

## 2020-01-26 DIAGNOSIS — I10 Essential (primary) hypertension: Secondary | ICD-10-CM | POA: Diagnosis not present

## 2020-01-26 DIAGNOSIS — E119 Type 2 diabetes mellitus without complications: Secondary | ICD-10-CM | POA: Diagnosis not present

## 2020-01-26 DIAGNOSIS — I480 Paroxysmal atrial fibrillation: Secondary | ICD-10-CM | POA: Diagnosis not present

## 2020-01-26 DIAGNOSIS — I2510 Atherosclerotic heart disease of native coronary artery without angina pectoris: Secondary | ICD-10-CM | POA: Diagnosis not present

## 2020-02-12 NOTE — H&P (Signed)
TOTAL HIP ADMISSION H&P  Patient is admitted for right total hip arthroplasty, anterior approach.  Subjective:  Chief Complaint: Right hip OA / pain  HPI: Ryan Wagner, 74 y.o. male, has a history of pain and functional disability in the right hip(s) due to arthritis and patient has failed non-surgical conservative treatments for greater than 12 weeks to include NSAID's and/or analgesics, corticosteriod injections and activity modification.  Onset of symptoms was gradual starting 1+ years ago with gradually worsening course since that time.The patient noted no past surgery on the right hip(s).  Patient currently rates pain in the right hip at 8 out of 10 with activity. Patient has night pain, worsening of pain with activity and weight bearing, trendelenberg gait, pain that interfers with activities of daily living and pain with passive range of motion. Patient has evidence of periarticular osteophytes and joint space narrowing by imaging studies. This condition presents safety issues increasing the risk of falls.  There is no current active infection.  Risks, benefits and expectations were discussed with the patient.  Risks including but not limited to the risk of anesthesia, blood clots, nerve damage, blood vessel damage, failure of the prosthesis, infection and up to and including death.  Patient understand the risks, benefits and expectations and wishes to proceed with surgery.   PCP: Shirline Frees, MD  D/C Plans:       Home (SD)  Post-op Meds:        Rx given for ASA, Robaxin, Norco, Iron, Colace and MiraLax  Tranexamic Acid:      To be given - IV   Decadron:      Is to be given  FYI:      Eliquis  Norco  DME:    Rx sent for - RW & 3-n-1  PT:  HEP  [facility]: [facility]    Patient Active Problem List   Diagnosis Date Noted  . Chronic anticoagulation 07/23/2018  . Hx of CABG 06/10/2018  . Abnormal ECG   . Cervical myelopathy ([place]) 04/15/2018  . PAF (paroxysmal atrial  fibrillation) ([place]) 02/26/2018  . Essential hypertension 02/26/2018  . Preoperative clearance 01/29/2018  . CAD S/P percutaneous coronary angioplasty 07/07/2015  . Old MI (myocardial infarction) 07/07/2015  . Dyslipidemia, goal LDL below 70 07/07/2015  . Primary osteoarthritis of left knee 06/01/2014  . Non-insulin dependent type 2 diabetes mellitus ([place]) 06/01/2014  . Primary osteoarthritis of knee 06/01/2014   Past Medical History:  Diagnosis Date  . Arthritis    knees, hands  . Atrial fibrillation ([place])   . CAD (coronary artery disease)    a. prior MI in 2004 with DESx3 to RCA  . Complication of anesthesia    had difficulty voiding after hemorrhoidectomy  . Diabetes mellitus without complication ([place])    type 2  . GERD (gastroesophageal reflux disease)    no longer with symptoms  . Headache    migraines as a child  . Hyperlipidemia   . Hypertension   . Sciatica    improved    Past Surgical History:  Procedure Laterality Date  . ADENOIDECTOMY    . ANTERIOR CERVICAL DECOMP/DISCECTOMY FUSION N/A 04/15/2018   Procedure: Cervical Four-Five Cervical Five-Six Anterior cervical decompression/discectomy/fusion;  Surgeon: Judith Part, MD;  Location: [place];  Service: Neurosurgery;  Laterality: N/A;  anterior approach  . APPENDECTOMY    . BAND HEMORRHOIDECTOMY    . CARDIAC CATHETERIZATION    . CLIPPING OF ATRIAL APPENDAGE N/A 06/11/2018   Procedure: CLIPPING  OF LEFT ATRIAL APPENDAGE using a 35 AtriCure Clip;  Surgeon: Grace Isaac, MD;  Location: [place];  Service: Open Heart Surgery;  Laterality: N/A;  . COLONOSCOPY    . CORONARY ANGIOPLASTY  2004  . CORONARY ARTERY BYPASS GRAFT N/A 06/11/2018   Procedure: CORONARY ARTERY BYPASS GRAFTING (CABG) x4 using left internal mammary artery and right greater saphenous vein harvested endoscopically. LIMA to distal LAD, Seq SVG to 1st & 2nd OM, SVG to distal PD;  Surgeon: Grace Isaac, MD;  Location: [place];  Service: Open  Heart Surgery;  Laterality: N/A;  . ESOPHAGEAL DILATION    . KNEE SURGERY Left   . LEFT HEART CATH AND CORONARY ANGIOGRAPHY N/A 06/10/2018   Procedure: LEFT HEART CATH AND CORONARY ANGIOGRAPHY;  Surgeon: Troy Sine, MD;  Location: [place] [facility];  Service: Cardiovascular;  Laterality: N/A;  . LIPOMA EXCISION     neck  . MAZE N/A 06/11/2018   Procedure: Pulmonary isolation MAZE using bipolar ablation;  Surgeon: Grace Isaac, MD;  Location: [place];  Service: Open Heart Surgery;  Laterality: N/A;  . TEE WITHOUT CARDIOVERSION N/A 06/11/2018   Procedure: TRANSESOPHAGEAL ECHOCARDIOGRAM (TEE);  Surgeon: Grace Isaac, MD;  Location: [place];  Service: Open Heart Surgery;  Laterality: N/A;  . TONSILLECTOMY    . TOTAL KNEE ARTHROPLASTY Left 06/01/2014   Procedure: TOTAL KNEE ARTHROPLASTY;  Surgeon: Hessie Dibble, MD;  Location: [place];  Service: Orthopedics;  Laterality: Left;    No current facility-administered medications for this encounter.   Current Outpatient Medications  Medication Sig Dispense Refill Last Dose  . aspirin EC 81 MG EC tablet Take 1 tablet (81 mg total) by mouth daily.     [name] atorvastatin (LIPITOR) 20 MG tablet Take 20 mg by mouth daily.     . Coenzyme Q10 (CO Q 10) 100 MG CAPS Take 100 mg by mouth daily.      [name] esomeprazole (NEXIUM) 20 MG capsule Take 20 mg by mouth daily.      [name] gabapentin (NEURONTIN) 300 MG capsule Take 300 mg by mouth at bedtime.      [name] 500 MG CAPS Take 500 mg by mouth daily.     [name] L-Arginine 1000 MG TABS Take 1,000 mg by mouth daily.      [name] latanoprost (XALATAN) 0.005 % ophthalmic solution Place 1 drop into both eyes at bedtime.     . Liraglutide (VICTOZA) 18 MG/3ML SOLN Inject 1.2 mg into the skin daily.      [name] losartan (COZAAR) 100 MG tablet Take 1 tablet (100 mg total) by mouth daily. 90 tablet 3   . MAGNESIUM GLUCONATE PO Take 150 mg by mouth daily.     . Menaquinone-7 (VITAMIN K2 PO) Take 1 capsule by mouth daily.     .  metFORMIN (GLUCOPHAGE) 500 MG tablet Take 500 mg by mouth 2 (two) times daily with a meal.     . Multiple Vitamin (MULTIVITAMIN) tablet Take 1 tablet by mouth daily.     . Turmeric 500 MG CAPS Take 500 mg by mouth daily.     . Vitamin D3 (VITAMIN D) 25 MCG tablet Take 2,000 Units by mouth daily.      . Zinc 50 MG CAPS Take 50 mg by mouth daily.      [name] acetaminophen (TYLENOL) 325 MG tablet Take 2 tablets (650 mg total) by mouth every 6 (six) hours as needed for mild pain. (Patient  not taking: Reported on 01/11/2020)   Not Taking at Unknown time  . amLODipine (NORVASC) 10 MG tablet TAKE ONE TABLET BY MOUTH ONCE DAILY 30 tablet 11   . atorvastatin (LIPITOR) 80 MG tablet Take 1 tablet (80 mg total) by mouth daily. (Patient not taking: Reported on 01/11/2020) 90 tablet 1 Not Taking at Unknown time  . ELIQUIS 5 MG TABS tablet Take 1 tablet by mouth twice daily (Patient not taking: Reported on 01/11/2020) 180 tablet 1 Not Taking at Unknown time  . metoprolol tartrate (LOPRESSOR) 25 MG tablet TAKE 1 TABLET (25 MG TOTAL) BY MOUTH 2 (TWO) TIMES DAILY. 180 tablet 3   . predniSONE (STERAPRED UNI-PAK 21 TAB) 10 MG (21) TBPK tablet Take by mouth daily. Take as directed. (Patient not taking: Reported on 01/11/2020) 21 tablet 0 Not Taking at Unknown time   Allergies  Allergen Reactions  . Ace Inhibitors Cough    Social History   Tobacco Use  . Smoking status: Never Smoker  . Smokeless tobacco: Never Used  Substance Use Topics  . Alcohol use: Yes    Comment: occasional    Family History  Problem Relation Age of Onset  . Dementia Mother   . Thyroid disease Mother   . Dementia Father   . High Cholesterol Father      Review of Systems  Constitutional: Negative.   HENT: Negative.   Eyes: Negative.   Respiratory: Negative.   Cardiovascular: Negative.   Gastrointestinal: Negative.   Genitourinary: Negative.   Musculoskeletal: Positive for joint pain.  Skin: Negative.   Neurological: Negative.    Endo/Heme/Allergies: Negative.   Psychiatric/Behavioral: Negative.       Objective:  Physical Exam Constitutional:      Appearance: He is well-developed.  HENT:     Head: Normocephalic.  Eyes:     Pupils: Pupils are equal, round, and reactive to light.  Neck:     Thyroid: No thyromegaly.     Vascular: No JVD.     Trachea: No tracheal deviation.  Cardiovascular:     Rate and Rhythm: Normal rate and regular rhythm.  Pulmonary:     Effort: Pulmonary effort is normal. No respiratory distress.     Breath sounds: Normal breath sounds. No wheezing.  Abdominal:     Palpations: Abdomen is soft.     Tenderness: There is no abdominal tenderness. There is no guarding.  Musculoskeletal:     Cervical back: Neck supple.     Right hip: Tenderness and bony tenderness present. No deformity. Decreased range of motion. Decreased strength.  Lymphadenopathy:     Cervical: No cervical adenopathy.  Skin:    General: Skin is warm and dry.  Neurological:     Mental Status: He is alert and oriented to person, place, and time.       Labs:  Estimated body mass index is 31.45 kg/m as calculated from the following:   Height as of 01/06/20: 5\' 7"  (1.702 m).   Weight as of 01/06/20: 91.1 kg.   Imaging Review Plain radiographs demonstrate severe degenerative joint disease of the right hip. The bone quality appears to be good for age and reported activity level.      Assessment/Plan:  End stage arthritis, right hip  The patient history, physical examination, clinical judgement of the provider and imaging studies are consistent with end stage degenerative joint disease of the right hip and total hip arthroplasty is deemed medically necessary. The treatment options including medical management, injection therapy, arthroscopy  and arthroplasty were discussed at length. The risks and benefits of total hip arthroplasty were presented and reviewed. The risks due to aseptic loosening, infection,  stiffness, dislocation/subluxation,  thromboembolic complications and other imponderables were discussed.  The patient acknowledged the explanation, agreed to proceed with the plan and consent was signed. Patient is being admitted for treatment for surgery, pain control, PT, OT, prophylactic antibiotics, VTE prophylaxis, progressive ambulation and ADL's and discharge planning.The patient is planning to be discharged home.

## 2020-02-15 NOTE — Progress Notes (Signed)
DUE TO COVID-19 ONLY ONE VISITOR IS ALLOWED TO COME WITH YOU AND STAY IN THE WAITING ROOM ONLY DURING PRE OP AND PROCEDURE DAY OF SURGERY. THE 1 VISITOR  MAY VISIT WITH YOU AFTER SURGERY IN YOUR PRIVATE ROOM DURING VISITING HOURS ONLY!  YOU NEED TO HAVE A COVID 19 TEST ON_______ @_______ , THIS TEST MUST BE DONE BEFORE SURGERY,  COVID TESTING SITE [place] [id number], IT IS ON THE RIGHT GOING OUT [address] APPROXIMATELY  2 MINUTES PAST [employer] ON THE RIGHT. ONCE YOUR COVID TEST IS COMPLETED,  PLEASE BEGIN THE QUARANTINE INSTRUCTIONS AS OUTLINED IN YOUR HANDOUT.                KOUPER SPINELLA  02/15/2020   Your procedure is scheduled on: 02/25/20    Report to [facility] Main  Entrance   Report to admitting at    [place] AM     Call this number if you have problems the morning of surgery [phone]    REMEMBER: NO  SOLID FOOD CANDY OR GUM AFTER MIDNIGHT. CLEAR LIQUIDS UNTIL  0430am         . NOTHING BY MOUTH EXCEPT CLEAR LIQUIDS UNTIL    . PLEASE FINISH ENSURE DRINK PER SURGEON ORDER  WHICH NEEDS TO BE COMPLETED AT   0430am   .      CLEAR LIQUID DIET   Foods Allowed                                                                    Coffee and tea, regular and decaf                            Fruit ices (not with fruit pulp)                                      Iced Popsicles                                    Carbonated beverages, regular and diet                                    Cranberry, grape and apple juices Sports drinks like Gatorade Lightly seasoned clear broth or consume(fat free) Sugar, honey syrup ___________________________________________________________________      BRUSH YOUR TEETH MORNING OF SURGERY AND RINSE YOUR MOUTH OUT, NO CHEWING GUM CANDY OR MINTS.     Take these medicines the morning of surgery with A SIP OF WATER:  Amlodipine, nexium  DO NOT TAKE ANY DIABETIC MEDICATIONS DAY OF YOUR SURGERY                                You may not have any metal on your body including hair pins and              piercings  Do not  wear jewelry, make-up, lotions, powders or perfumes, deodorant             Do not wear nail polish on your fingernails.  Do not shave  48 hours prior to surgery.              Men may shave face and neck.   Do not bring valuables to the hospital. [place].  Contacts, dentures or bridgework may not be worn into surgery.  Leave suitcase in the car. After surgery it may be brought to your room.     Patients discharged the day of surgery will not be allowed to drive home. IF YOU ARE HAVING SURGERY AND GOING HOME THE SAME DAY, YOU MUST HAVE AN ADULT TO DRIVE YOU HOME AND BE WITH YOU FOR 24 HOURS. YOU MAY GO HOME BY TAXI OR UBER OR ORTHERWISE, BUT AN ADULT MUST ACCOMPANY YOU HOME AND STAY WITH YOU FOR 24 HOURS.  Name and phone number of your driver:  Special Instructions: N/A              Please read over the following fact sheets you were given: _____________________________________________________________________  [facility] - Preparing for Surgery Before surgery, you can play an important role.  Because skin is not sterile, your skin needs to be as free of germs as possible.  You can reduce the number of germs on your skin by washing with CHG (chlorahexidine gluconate) soap before surgery.  CHG is an antiseptic cleaner which kills germs and bonds with the skin to continue killing germs even after washing. Please DO NOT use if you have an allergy to CHG or antibacterial soaps.  If your skin becomes reddened/irritated stop using the CHG and inform your nurse when you arrive at Short Stay. Do not shave (including legs and underarms) for at least 48 hours prior to the first CHG shower.  You may shave your face/neck. Please follow these instructions carefully:  1.  Shower with CHG Soap the night before surgery and the  morning of  Surgery.  2.  If you choose to wash your hair, wash your hair first as usual with your  normal  shampoo.  3.  After you shampoo, rinse your hair and body thoroughly to remove the  shampoo.                           4.  Use CHG as you would any other liquid soap.  You can apply chg directly  to the skin and wash                       Gently with a scrungie or clean washcloth.  5.  Apply the CHG Soap to your body ONLY FROM THE NECK DOWN.   Do not use on face/ open                           Wound or open sores. Avoid contact with eyes, ears mouth and genitals (private parts).                       Wash face,  Genitals (private parts) with your normal soap.             6.  Wash thoroughly, paying  special attention to the area where your surgery  will be performed.  7.  Thoroughly rinse your body with warm water from the neck down.  8.  DO NOT shower/wash with your normal soap after using and rinsing off  the CHG Soap.                9.  Pat yourself dry with a clean towel.            10.  Wear clean pajamas.            11.  Place clean sheets on your bed the night of your first shower and do not  sleep with pets. Day of Surgery : Do not apply any lotions/deodorants the morning of surgery.  Please wear clean clothes to the hospital/surgery center.  FAILURE TO FOLLOW THESE INSTRUCTIONS MAY RESULT IN THE CANCELLATION OF YOUR SURGERY PATIENT SIGNATURE_________________________________  NURSE SIGNATURE__________________________________  ________________________________________________________________________

## 2020-02-18 ENCOUNTER — Encounter (HOSPITAL_COMMUNITY): Payer: Self-pay

## 2020-02-18 ENCOUNTER — Other Ambulatory Visit: Payer: Self-pay

## 2020-02-18 ENCOUNTER — Encounter (HOSPITAL_COMMUNITY)
Admission: RE | Admit: 2020-02-18 | Discharge: 2020-02-18 | Disposition: A | Discharge status: Home / Self Care | Payer: HMO | Source: Ambulatory Visit | Attending: Orthopedic Surgery | Admitting: Orthopedic Surgery

## 2020-02-18 DIAGNOSIS — Z951 Presence of aortocoronary bypass graft: Secondary | ICD-10-CM | POA: Diagnosis not present

## 2020-02-18 DIAGNOSIS — M19041 Primary osteoarthritis, right hand: Secondary | ICD-10-CM | POA: Diagnosis not present

## 2020-02-18 DIAGNOSIS — Z7984 Long term (current) use of oral hypoglycemic drugs: Secondary | ICD-10-CM | POA: Diagnosis not present

## 2020-02-18 DIAGNOSIS — E119 Type 2 diabetes mellitus without complications: Secondary | ICD-10-CM | POA: Diagnosis not present

## 2020-02-18 DIAGNOSIS — Z01812 Encounter for preprocedural laboratory examination: Secondary | ICD-10-CM | POA: Insufficient documentation

## 2020-02-18 DIAGNOSIS — I252 Old myocardial infarction: Secondary | ICD-10-CM | POA: Insufficient documentation

## 2020-02-18 DIAGNOSIS — M170 Bilateral primary osteoarthritis of knee: Secondary | ICD-10-CM | POA: Insufficient documentation

## 2020-02-18 DIAGNOSIS — Z79899 Other long term (current) drug therapy: Secondary | ICD-10-CM | POA: Insufficient documentation

## 2020-02-18 DIAGNOSIS — I4891 Unspecified atrial fibrillation: Secondary | ICD-10-CM | POA: Insufficient documentation

## 2020-02-18 DIAGNOSIS — Z8616 Personal history of COVID-19: Secondary | ICD-10-CM | POA: Diagnosis not present

## 2020-02-18 DIAGNOSIS — E785 Hyperlipidemia, unspecified: Secondary | ICD-10-CM | POA: Diagnosis not present

## 2020-02-18 DIAGNOSIS — M19042 Primary osteoarthritis, left hand: Secondary | ICD-10-CM | POA: Insufficient documentation

## 2020-02-18 DIAGNOSIS — Z96652 Presence of left artificial knee joint: Secondary | ICD-10-CM | POA: Insufficient documentation

## 2020-02-18 DIAGNOSIS — M1611 Unilateral primary osteoarthritis, right hip: Secondary | ICD-10-CM | POA: Diagnosis not present

## 2020-02-18 DIAGNOSIS — I25810 Atherosclerosis of coronary artery bypass graft(s) without angina pectoris: Secondary | ICD-10-CM | POA: Insufficient documentation

## 2020-02-18 DIAGNOSIS — I10 Essential (primary) hypertension: Secondary | ICD-10-CM | POA: Insufficient documentation

## 2020-02-18 HISTORY — DX: Acute myocardial infarction, unspecified: I21.9

## 2020-02-18 LAB — BASIC METABOLIC PANEL
Anion gap: 8 (ref 5–15)
BUN: 14 mg/dL (ref 8–23)
CO2: 25 mmol/L (ref 22–32)
Calcium: 8.4 mg/dL — ABNORMAL LOW (ref 8.9–10.3)
Chloride: 106 mmol/L (ref 98–111)
Creatinine, Ser: 0.86 mg/dL (ref 0.61–1.24)
GFR calc Af Amer: >60 mL/min (ref >60)
GFR calc non Af Amer: >60 mL/min (ref >60)
Glucose, Bld: 121 mg/dL — ABNORMAL HIGH (ref 70–99)
Potassium: 4.1 mmol/L (ref 3.5–5.1)
Sodium: 139 mmol/L (ref 135–145)

## 2020-02-18 LAB — CBC
HCT: 41.3 % (ref 39.0–52.0)
Hemoglobin: 13.2 g/dL (ref 13.0–17.0)
MCH: 26.6 pg (ref 26.0–34.0)
MCHC: 32 g/dL (ref 30.0–36.0)
MCV: 83.1 fL (ref 80.0–100.0)
Platelets: 252 10*3/uL (ref 150–400)
RBC: 4.97 MIL/uL (ref 4.22–5.81)
RDW: 16.7 % — ABNORMAL HIGH (ref 11.5–15.5)
WBC: 7.4 10*3/uL (ref 4.0–10.5)
nRBC: 0 % (ref 0.0–0.2)

## 2020-02-18 LAB — GLUCOSE, CAPILLARY: Glucose-Capillary: 123 mg/dL — ABNORMAL HIGH (ref 70–99)

## 2020-02-18 LAB — HEMOGLOBIN A1C
Hgb A1c MFr Bld: 6.1 % — ABNORMAL HIGH (ref 4.8–5.6)
Mean Plasma Glucose: 128.37 mg/dL

## 2020-02-18 LAB — SURGICAL PCR SCREEN
MRSA, PCR: NEGATIVE
Staphylococcus aureus: NEGATIVE

## 2020-02-18 NOTE — Progress Notes (Signed)
Anesthesia Chart Review   Case: [id number] Date/Time: 02/25/20 0700   Procedure: TOTAL HIP ARTHROPLASTY ANTERIOR APPROACH (Right Hip) - 70 mins   Anesthesia type: Spinal   Pre-op diagnosis: Right hip osteoarthritis   Location: [facility] ROOM 08 / [facility]   Surgeons: Paralee Cancel, MD      DISCUSSION:73 y.o. never smoker with h/o HTN, HLD, CAD (s/p CABG 05/2018), GERD, atrial fibrillation (s/p MAZE procedure), DM II, right hip OA scheduled for above procedure 02/25/2020 with Dr. Paralee Cancel.   Pt tested positive for COVID 01/10/2020, will not be retested prior to surgery.    Pt last seen by cardiology 01/06/2020.  Per OV note, "Preoperative clearance: Patient has upcoming procedure by Dr. Alvan Dame.  His previous cardiac catheterization was in January 2020.  He denies any recent chest pain or shortness of breath.  He is stable from cardiac perspective to proceed with surgery.  He is no longer on Eliquis.  I recommend he continue aspirin through the surgery."  Anticipate pt can proceed with planned procedure barring acute status change.   VS: BP 132/70   Pulse (!) 59   Temp 37 C (Oral)   Ht 5\' 7"  (1.702 m)   Wt 90.7 kg   SpO2 99%   BMI 31.33 kg/m   PROVIDERS: Shirline Frees, MD is PCP   Shelva Majestic, MD is Cardiologist  LABS: Labs reviewed: Acceptable for surgery. (all labs ordered are listed, but only abnormal results are displayed)  Labs Reviewed  BASIC METABOLIC PANEL - Abnormal; Notable for the following components:      Result Value   Glucose, Bld 121 (*)    Calcium 8.4 (*)    All other components within normal limits  CBC - Abnormal; Notable for the following components:   RDW 16.7 (*)    All other components within normal limits  HEMOGLOBIN A1C - Abnormal; Notable for the following components:   Hgb A1c MFr Bld 6.1 (*)    All other components within normal limits  SURGICAL PCR SCREEN  TYPE AND SCREEN     IMAGES:   EKG: 01/06/2020 Rate 60 bpm  Sinus rhythm with 1st degree  AV block  Nonspecific T wave abnormality   CV: Echo 06/11/2018  Septum: No Patent Foramen Ovale present.   Left atrium: Patent foramen ovale not present.   Aortic valve: No stenosis. Trace regurgitation. No AV vegetation.   Mitral valve: No leaflet thickening and calcification present. Trace  regurgitation.   Right ventricle: Normal cavity size, wall thickness and ejection  fraction. No thrombus present. No mass present.   Pulmonic valve: Trace regurgitation.  Past Medical History:  Diagnosis Date  . Arthritis    knees, hands  . Atrial fibrillation ([place])   . CAD (coronary artery disease)    a. prior MI in 2004 with DESx3 to RCA  . Complication of anesthesia    had difficulty voiding after hemorrhoidectomy  . Diabetes mellitus without complication ([place])    type 2  . GERD (gastroesophageal reflux disease)    no longer with symptoms  . Hyperlipidemia   . Hypertension   . Myocardial infarction ([place])   . Sciatica    improved    Past Surgical History:  Procedure Laterality Date  . ADENOIDECTOMY    . ANTERIOR CERVICAL DECOMP/DISCECTOMY FUSION N/A 04/15/2018   Procedure: Cervical Four-Five Cervical Five-Six Anterior cervical decompression/discectomy/fusion;  Surgeon: Judith Part, MD;  Location: [place];  Service: Neurosurgery;  Laterality: N/A;  anterior approach  .  APPENDECTOMY    . BAND HEMORRHOIDECTOMY    . CARDIAC CATHETERIZATION    . CLIPPING OF ATRIAL APPENDAGE N/A 06/11/2018   Procedure: CLIPPING OF LEFT ATRIAL APPENDAGE using a 35 AtriCure Clip;  Surgeon: Grace Isaac, MD;  Location: [place];  Service: Open Heart Surgery;  Laterality: N/A;  . COLONOSCOPY    . CORONARY ANGIOPLASTY  2004  . CORONARY ARTERY BYPASS GRAFT N/A 06/11/2018   Procedure: CORONARY ARTERY BYPASS GRAFTING (CABG) x4 using left internal mammary artery and right greater saphenous vein harvested endoscopically. LIMA to distal LAD, Seq SVG to 1st & 2nd OM, SVG to distal PD;  Surgeon:  Grace Isaac, MD;  Location: [place];  Service: Open Heart Surgery;  Laterality: N/A;  . ESOPHAGEAL DILATION    . KNEE SURGERY Left   . LEFT HEART CATH AND CORONARY ANGIOGRAPHY N/A 06/10/2018   Procedure: LEFT HEART CATH AND CORONARY ANGIOGRAPHY;  Surgeon: Troy Sine, MD;  Location: [place] CV LAB;  Service: Cardiovascular;  Laterality: N/A;  . LIPOMA EXCISION     neck  . MAZE N/A 06/11/2018   Procedure: Pulmonary isolation MAZE using bipolar ablation;  Surgeon: Grace Isaac, MD;  Location: [place];  Service: Open Heart Surgery;  Laterality: N/A;  . TEE WITHOUT CARDIOVERSION N/A 06/11/2018   Procedure: TRANSESOPHAGEAL ECHOCARDIOGRAM (TEE);  Surgeon: Grace Isaac, MD;  Location: [place];  Service: Open Heart Surgery;  Laterality: N/A;  . TONSILLECTOMY    . TOTAL KNEE ARTHROPLASTY Left 06/01/2014   Procedure: TOTAL KNEE ARTHROPLASTY;  Surgeon: Hessie Dibble, MD;  Location: [place];  Service: Orthopedics;  Laterality: Left;    MEDICATIONS: . acetaminophen (TYLENOL) 325 MG tablet  . amLODipine (NORVASC) 10 MG tablet  . aspirin EC 81 MG EC tablet  . atorvastatin (LIPITOR) 20 MG tablet  . atorvastatin (LIPITOR) 80 MG tablet  . Coenzyme Q10 (CO Q 10) 100 MG CAPS  . ELIQUIS 5 MG TABS tablet  . esomeprazole (NEXIUM) 20 MG capsule  . gabapentin (NEURONTIN) 300 MG capsule  . Krill Oil 500 MG CAPS  . L-Arginine 1000 MG TABS  . latanoprost (XALATAN) 0.005 % ophthalmic solution  . Liraglutide (VICTOZA) 18 MG/3ML SOLN  . losartan (COZAAR) 100 MG tablet  . MAGNESIUM GLUCONATE PO  . Menaquinone-7 (VITAMIN K2 PO)  . metFORMIN (GLUCOPHAGE) 500 MG tablet  . metoprolol tartrate (LOPRESSOR) 25 MG tablet  . Multiple Vitamin (MULTIVITAMIN) tablet  . predniSONE (STERAPRED UNI-PAK 21 TAB) 10 MG (21) TBPK tablet  . Turmeric 500 MG CAPS  . Vitamin D3 (VITAMIN D) 25 MCG tablet  . Zinc 50 MG CAPS   No current facility-administered medications for this encounter.    Konrad Felix,  PA-C [facility] Pre-Surgical Testing [phone]

## 2020-02-18 NOTE — Progress Notes (Addendum)
Anesthesia Review:  PCP: DR Gracy Racer  Cardiologist :DR Claiborne Billings  preop eval by cardiology on 01/06/2020 in epic  06/10/18-PFT Chest x-ray : EKG :01/06/20  Echo : 06/11/2018  Stress test: Cardiac Cath : 06/10/2018  Activity level:  Can do a flight of stairs without difficulty  Sleep Study/ CPAP : Fasting Blood Sugar :      / Checks Blood Sugar -- times a day:   Blood Thinner/ Instructions /Last Dose: ASA / Instructions/ Last Dose :  DM- type 2  HGB A!C done 02/18/2020 6.1  81 mg ASA- last dose 02/17/2020 pm  Patient tested positive for Covid on 01/10/20.  REsults on front of chart.  Wife tested positive on 8/22 oar 01/18/20 per pt.   Patient states he does not quite have his sense of taste and smell back.   Had Covid infusion on 01/15/2020.

## 2020-02-18 NOTE — Anesthesia Preprocedure Evaluation (Addendum)
Anesthesia Evaluation  Patient identified by MRN, date of birth, ID band Patient awake    Reviewed: Allergy & Precautions, NPO status , Patient's Chart, lab work & pertinent test results  Airway Mallampati: II  TM Distance: >3 FB Neck ROM: Limited    Dental no notable dental hx.    Pulmonary neg pulmonary ROS,    Pulmonary exam normal breath sounds clear to auscultation       Cardiovascular Exercise Tolerance: Good hypertension, + CAD and + Past MI  Normal cardiovascular exam+ dysrhythmias (paroxysmal afib) Atrial Fibrillation  Rhythm:Regular Rate:Normal  Pt last seen by cardiology 01/06/2020.  Per OV note, "Preoperative clearance: Patient has upcoming procedure by Dr. Alvan Dame. His previous cardiac catheterization was in January 2020. He denies any recent chest pain or shortness of breath. He is stable from cardiac perspective to proceed with surgery. He is no longer on Eliquis. I recommend he continue aspirin through the surgery."   Neuro/Psych  Neuromuscular disease (sciatica)    GI/Hepatic GERD  ,  Endo/Other  diabetes, Well Controlled, Type 2, Oral Hypoglycemic Agents  Renal/GU   negative genitourinary   Musculoskeletal  (+) Arthritis  (s/p cervical discectomy),   Abdominal Normal abdominal exam  (+)   Peds negative pediatric ROS (+)  Hematology negative hematology ROS (+)   Anesthesia Other Findings   Reproductive/Obstetrics negative OB ROS                           Anesthesia Physical Anesthesia Plan  ASA: III  Anesthesia Plan: Spinal   Post-op Pain Management:    Induction: Intravenous  PONV Risk Score and Plan: 1 and Propofol infusion, TIVA and Treatment may vary due to age or medical condition  Airway Management Planned: Natural Airway and Simple Face Mask  Additional Equipment: None  Intra-op Plan:   Post-operative Plan:   Informed Consent: I have reviewed the  patients History and Physical, chart, labs and discussed the procedure including the risks, benefits and alternatives for the proposed anesthesia with the patient or authorized representative who has indicated his/her understanding and acceptance.     Dental advisory given  Plan Discussed with: Anesthesiologist and CRNA  Anesthesia Plan Comments: (Spinal + propofol gtt. GETA as backup plan. )      Anesthesia Quick Evaluation

## 2020-02-24 ENCOUNTER — Encounter (HOSPITAL_COMMUNITY): Payer: Self-pay | Admitting: Orthopedic Surgery

## 2020-02-25 ENCOUNTER — Other Ambulatory Visit: Payer: Self-pay

## 2020-02-25 ENCOUNTER — Ambulatory Visit (HOSPITAL_COMMUNITY): Payer: HMO | Admitting: Physician Assistant

## 2020-02-25 ENCOUNTER — Ambulatory Visit (HOSPITAL_COMMUNITY): Payer: HMO

## 2020-02-25 ENCOUNTER — Encounter (HOSPITAL_COMMUNITY): Payer: Self-pay | Admitting: Orthopedic Surgery

## 2020-02-25 ENCOUNTER — Ambulatory Visit (HOSPITAL_COMMUNITY): Payer: HMO | Admitting: Anesthesiology

## 2020-02-25 ENCOUNTER — Encounter (HOSPITAL_COMMUNITY)
Admission: RE | Disposition: A | Discharge status: Home / Self Care | Payer: Self-pay | Source: Home / Self Care | Attending: Orthopedic Surgery

## 2020-02-25 ENCOUNTER — Ambulatory Visit (HOSPITAL_COMMUNITY)
Admission: RE | Admit: 2020-02-25 | Discharge: 2020-02-25 | Disposition: A | Discharge status: Home / Self Care | Payer: HMO | Attending: Orthopedic Surgery | Admitting: Orthopedic Surgery

## 2020-02-25 DIAGNOSIS — I480 Paroxysmal atrial fibrillation: Secondary | ICD-10-CM | POA: Insufficient documentation

## 2020-02-25 DIAGNOSIS — Z888 Allergy status to other drugs, medicaments and biological substances status: Secondary | ICD-10-CM | POA: Insufficient documentation

## 2020-02-25 DIAGNOSIS — M25751 Osteophyte, right hip: Secondary | ICD-10-CM | POA: Diagnosis not present

## 2020-02-25 DIAGNOSIS — R2689 Other abnormalities of gait and mobility: Secondary | ICD-10-CM | POA: Insufficient documentation

## 2020-02-25 DIAGNOSIS — E785 Hyperlipidemia, unspecified: Secondary | ICD-10-CM | POA: Insufficient documentation

## 2020-02-25 DIAGNOSIS — I2510 Atherosclerotic heart disease of native coronary artery without angina pectoris: Secondary | ICD-10-CM | POA: Insufficient documentation

## 2020-02-25 DIAGNOSIS — Z419 Encounter for procedure for purposes other than remedying health state, unspecified: Secondary | ICD-10-CM

## 2020-02-25 DIAGNOSIS — Z79899 Other long term (current) drug therapy: Secondary | ICD-10-CM | POA: Insufficient documentation

## 2020-02-25 DIAGNOSIS — M1611 Unilateral primary osteoarthritis, right hip: Secondary | ICD-10-CM | POA: Diagnosis not present

## 2020-02-25 DIAGNOSIS — Z7982 Long term (current) use of aspirin: Secondary | ICD-10-CM | POA: Insufficient documentation

## 2020-02-25 DIAGNOSIS — Z7901 Long term (current) use of anticoagulants: Secondary | ICD-10-CM | POA: Diagnosis not present

## 2020-02-25 DIAGNOSIS — Z96649 Presence of unspecified artificial hip joint: Secondary | ICD-10-CM

## 2020-02-25 DIAGNOSIS — I252 Old myocardial infarction: Secondary | ICD-10-CM | POA: Insufficient documentation

## 2020-02-25 DIAGNOSIS — K219 Gastro-esophageal reflux disease without esophagitis: Secondary | ICD-10-CM | POA: Insufficient documentation

## 2020-02-25 DIAGNOSIS — Z471 Aftercare following joint replacement surgery: Secondary | ICD-10-CM | POA: Diagnosis not present

## 2020-02-25 DIAGNOSIS — M25851 Other specified joint disorders, right hip: Secondary | ICD-10-CM | POA: Diagnosis not present

## 2020-02-25 DIAGNOSIS — I10 Essential (primary) hypertension: Secondary | ICD-10-CM | POA: Insufficient documentation

## 2020-02-25 DIAGNOSIS — Z951 Presence of aortocoronary bypass graft: Secondary | ICD-10-CM | POA: Insufficient documentation

## 2020-02-25 DIAGNOSIS — Z96641 Presence of right artificial hip joint: Secondary | ICD-10-CM

## 2020-02-25 DIAGNOSIS — Z7984 Long term (current) use of oral hypoglycemic drugs: Secondary | ICD-10-CM | POA: Insufficient documentation

## 2020-02-25 DIAGNOSIS — E119 Type 2 diabetes mellitus without complications: Secondary | ICD-10-CM | POA: Diagnosis not present

## 2020-02-25 HISTORY — PX: TOTAL HIP ARTHROPLASTY: SHX124

## 2020-02-25 LAB — TYPE AND SCREEN
ABO/RH(D): A POS
Antibody Screen: NEGATIVE

## 2020-02-25 LAB — GLUCOSE, CAPILLARY
Glucose-Capillary: 104 mg/dL — ABNORMAL HIGH (ref 70–99)
Glucose-Capillary: 120 mg/dL — ABNORMAL HIGH (ref 70–99)

## 2020-02-25 SURGERY — ARTHROPLASTY, HIP, TOTAL, ANTERIOR APPROACH
Anesthesia: Spinal | Site: Hip | Laterality: Right

## 2020-02-25 MED ORDER — PROPOFOL 1000 MG/100ML IV EMUL
INTRAVENOUS | Status: AC
  Filled 2020-02-25: qty 200

## 2020-02-25 MED ORDER — CEFAZOLIN SODIUM-DEXTROSE 2-4 GM/100ML-% IV SOLN: 2.0000 g | Freq: Four times a day (QID) | INTRAVENOUS | Status: DC

## 2020-02-25 MED ORDER — POLYETHYLENE GLYCOL 3350 17 G PO PACK: 17.0000 g | PACK | Freq: Two times a day (BID) | ORAL | 0 refills | Status: AC

## 2020-02-25 MED ORDER — LIDOCAINE 2% (20 MG/ML) 5 ML SYRINGE
INTRAMUSCULAR | Status: DC | PRN
  Administered 2020-02-25: 60 mg via INTRAVENOUS

## 2020-02-25 MED ORDER — TRANEXAMIC ACID-NACL 1000-0.7 MG/100ML-% IV SOLN
1000.0000 mg | Freq: Once | INTRAVENOUS | Status: AC
  Administered 2020-02-25: 1000 mg via INTRAVENOUS

## 2020-02-25 MED ORDER — PROMETHAZINE HCL 25 MG/ML IJ SOLN: 6.2500 mg | INTRAMUSCULAR | Status: DC | PRN

## 2020-02-25 MED ORDER — CHLORHEXIDINE GLUCONATE 0.12 % MT SOLN
15.0000 mL | Freq: Once | OROMUCOSAL | Status: AC
  Administered 2020-02-25: 15 mL via OROMUCOSAL

## 2020-02-25 MED ORDER — LACTATED RINGERS IV BOLUS
500.0000 mL | Freq: Once | INTRAVENOUS | Status: AC
  Administered 2020-02-25: 500 mL via INTRAVENOUS

## 2020-02-25 MED ORDER — FENTANYL CITRATE (PF) 100 MCG/2ML IJ SOLN
INTRAMUSCULAR | Status: DC | PRN
Start: 2020-02-25 — End: 2020-02-25
  Administered 2020-02-25: 50 ug via INTRAVENOUS

## 2020-02-25 MED ORDER — METHOCARBAMOL 500 MG PO TABS: 500.0000 mg | ORAL_TABLET | Freq: Four times a day (QID) | ORAL | Status: DC | PRN

## 2020-02-25 MED ORDER — FENTANYL CITRATE (PF) 100 MCG/2ML IJ SOLN
INTRAMUSCULAR | Status: AC
  Filled 2020-02-25: qty 2

## 2020-02-25 MED ORDER — TRANEXAMIC ACID-NACL 1000-0.7 MG/100ML-% IV SOLN
INTRAVENOUS | Status: AC
  Filled 2020-02-25: qty 100

## 2020-02-25 MED ORDER — ONDANSETRON HCL 4 MG PO TABS
4.0000 mg | ORAL_TABLET | Freq: Four times a day (QID) | ORAL | Status: DC | PRN
  Filled 2020-02-25: qty 1

## 2020-02-25 MED ORDER — PROPOFOL 10 MG/ML IV BOLUS
INTRAVENOUS | Status: DC | PRN
  Administered 2020-02-25: 10 mg via INTRAVENOUS

## 2020-02-25 MED ORDER — METHOCARBAMOL 500 MG PO TABS: 500.0000 mg | ORAL_TABLET | Freq: Four times a day (QID) | ORAL | 0 refills | Status: AC | PRN

## 2020-02-25 MED ORDER — LACTATED RINGERS IV SOLN: INTRAVENOUS | Status: DC

## 2020-02-25 MED ORDER — HYDROCODONE-ACETAMINOPHEN 5-325 MG PO TABS: 1.0000 | ORAL_TABLET | ORAL | Status: DC | PRN

## 2020-02-25 MED ORDER — 0.9 % SODIUM CHLORIDE (POUR BTL) OPTIME
TOPICAL | Status: DC | PRN
Start: 2020-02-25 — End: 2020-02-25
  Administered 2020-02-25: 1000 mL

## 2020-02-25 MED ORDER — OXYCODONE HCL 5 MG/5ML PO SOLN: 5.0000 mg | Freq: Once | ORAL | Status: DC | PRN

## 2020-02-25 MED ORDER — GLYCOPYRROLATE PF 0.2 MG/ML IJ SOSY
PREFILLED_SYRINGE | INTRAMUSCULAR | Status: AC
  Filled 2020-02-25: qty 1

## 2020-02-25 MED ORDER — ACETAMINOPHEN 325 MG PO TABS: 325.0000 mg | ORAL_TABLET | Freq: Four times a day (QID) | ORAL | Status: DC | PRN

## 2020-02-25 MED ORDER — PROPOFOL 10 MG/ML IV BOLUS
INTRAVENOUS | Status: AC
  Filled 2020-02-25: qty 40

## 2020-02-25 MED ORDER — METOCLOPRAMIDE HCL 5 MG/ML IJ SOLN: 5.0000 mg | Freq: Three times a day (TID) | INTRAMUSCULAR | Status: DC | PRN

## 2020-02-25 MED ORDER — PHENYLEPHRINE HCL-NACL 10-0.9 MG/250ML-% IV SOLN
INTRAVENOUS | Status: DC | PRN
  Administered 2020-02-25: 25 ug/min via INTRAVENOUS

## 2020-02-25 MED ORDER — ONDANSETRON HCL 4 MG/2ML IJ SOLN
INTRAMUSCULAR | Status: AC
  Filled 2020-02-25: qty 4

## 2020-02-25 MED ORDER — LACTATED RINGERS IV BOLUS
250.0000 mL | Freq: Once | INTRAVENOUS | Status: AC
  Administered 2020-02-25: 250 mL via INTRAVENOUS

## 2020-02-25 MED ORDER — METHOCARBAMOL 500 MG IVPB - SIMPLE MED
INTRAVENOUS | Status: AC
  Filled 2020-02-25: qty 50

## 2020-02-25 MED ORDER — FENTANYL CITRATE (PF) 100 MCG/2ML IJ SOLN
25.0000 ug | INTRAMUSCULAR | Status: DC | PRN
  Administered 2020-02-25: 25 ug via INTRAVENOUS
  Administered 2020-02-25 (×2): 50 ug via INTRAVENOUS
  Administered 2020-02-25: 25 ug via INTRAVENOUS

## 2020-02-25 MED ORDER — TRANEXAMIC ACID-NACL 1000-0.7 MG/100ML-% IV SOLN
1000.0000 mg | INTRAVENOUS | Status: AC
  Administered 2020-02-25: 1000 mg via INTRAVENOUS
  Filled 2020-02-25: qty 100

## 2020-02-25 MED ORDER — DOCUSATE SODIUM 100 MG PO CAPS: 100.0000 mg | ORAL_CAPSULE | Freq: Two times a day (BID) | ORAL | 0 refills | Status: AC

## 2020-02-25 MED ORDER — ONDANSETRON HCL 4 MG/2ML IJ SOLN: 4.0000 mg | Freq: Four times a day (QID) | INTRAMUSCULAR | Status: DC | PRN

## 2020-02-25 MED ORDER — METOCLOPRAMIDE HCL 5 MG PO TABS
5.0000 mg | ORAL_TABLET | Freq: Three times a day (TID) | ORAL | Status: DC | PRN
  Filled 2020-02-25: qty 2

## 2020-02-25 MED ORDER — GLYCOPYRROLATE 0.2 MG/ML IJ SOLN
INTRAMUSCULAR | Status: DC | PRN
  Administered 2020-02-25: .2 mg via INTRAVENOUS

## 2020-02-25 MED ORDER — METHOCARBAMOL 500 MG IVPB - SIMPLE MED
500.0000 mg | Freq: Four times a day (QID) | INTRAVENOUS | Status: DC | PRN
  Administered 2020-02-25: 500 mg via INTRAVENOUS

## 2020-02-25 MED ORDER — PHENYLEPHRINE HCL-NACL 10-0.9 MG/250ML-% IV SOLN
INTRAVENOUS | Status: AC
  Filled 2020-02-25: qty 500

## 2020-02-25 MED ORDER — ORAL CARE MOUTH RINSE: 15.0000 mL | Freq: Once | OROMUCOSAL | Status: AC

## 2020-02-25 MED ORDER — DEXAMETHASONE SODIUM PHOSPHATE 10 MG/ML IJ SOLN
INTRAMUSCULAR | Status: AC
  Filled 2020-02-25: qty 2

## 2020-02-25 MED ORDER — LIDOCAINE 2% (20 MG/ML) 5 ML SYRINGE
INTRAMUSCULAR | Status: AC
  Filled 2020-02-25: qty 10

## 2020-02-25 MED ORDER — PROPOFOL 500 MG/50ML IV EMUL
INTRAVENOUS | Status: DC | PRN
  Administered 2020-02-25: 65 ug/kg/min via INTRAVENOUS

## 2020-02-25 MED ORDER — BUPIVACAINE IN DEXTROSE 0.75-8.25 % IT SOLN
INTRATHECAL | Status: DC | PRN
  Administered 2020-02-25: 1.6 mL via INTRATHECAL

## 2020-02-25 MED ORDER — FERROUS SULFATE 325 (65 FE) MG PO TABS: 325.0000 mg | ORAL_TABLET | Freq: Three times a day (TID) | ORAL | 0 refills | Status: DC

## 2020-02-25 MED ORDER — ONDANSETRON HCL 4 MG/2ML IJ SOLN
INTRAMUSCULAR | Status: DC | PRN
  Administered 2020-02-25: 4 mg via INTRAVENOUS

## 2020-02-25 MED ORDER — DEXAMETHASONE SODIUM PHOSPHATE 10 MG/ML IJ SOLN: 10.0000 mg | Freq: Once | INTRAMUSCULAR | Status: DC

## 2020-02-25 MED ORDER — HYDROCODONE-ACETAMINOPHEN 7.5-325 MG PO TABS
1.0000 | ORAL_TABLET | ORAL | Status: DC | PRN
  Administered 2020-02-25: 2 via ORAL

## 2020-02-25 MED ORDER — OXYCODONE HCL 5 MG PO TABS: 5.0000 mg | ORAL_TABLET | Freq: Once | ORAL | Status: DC | PRN

## 2020-02-25 MED ORDER — CELECOXIB 200 MG PO CAPS: 200.0000 mg | ORAL_CAPSULE | Freq: Two times a day (BID) | ORAL | Status: DC

## 2020-02-25 MED ORDER — ASPIRIN 81 MG PO CHEW: 81.0000 mg | CHEWABLE_TABLET | Freq: Two times a day (BID) | ORAL | 0 refills | Status: AC

## 2020-02-25 MED ORDER — DEXAMETHASONE SODIUM PHOSPHATE 10 MG/ML IJ SOLN
INTRAMUSCULAR | Status: DC | PRN
  Administered 2020-02-25: 4 mg via INTRAVENOUS

## 2020-02-25 MED ORDER — HYDROCODONE-ACETAMINOPHEN 7.5-325 MG PO TABS: 1.0000 | ORAL_TABLET | ORAL | 0 refills | Status: DC | PRN

## 2020-02-25 MED ORDER — HYDROCODONE-ACETAMINOPHEN 7.5-325 MG PO TABS
ORAL_TABLET | ORAL | Status: AC
  Filled 2020-02-25: qty 2

## 2020-02-25 MED ORDER — CEFAZOLIN SODIUM-DEXTROSE 2-4 GM/100ML-% IV SOLN
2.0000 g | INTRAVENOUS | Status: AC
  Administered 2020-02-25: 2 g via INTRAVENOUS
  Filled 2020-02-25: qty 100

## 2020-02-25 SURGICAL SUPPLY — 45 items
BAG DECANTER FOR FLEXI CONT (MISCELLANEOUS) IMPLANT
BAG ZIPLOCK 12X15 (MISCELLANEOUS) IMPLANT
BLADE SAG 18X100X1.27 (BLADE) ×2 IMPLANT
BLADE SURG SZ10 CARB STEEL (BLADE) ×4 IMPLANT
COVER PERINEAL POST (MISCELLANEOUS) ×2 IMPLANT
COVER SURGICAL LIGHT HANDLE (MISCELLANEOUS) ×2 IMPLANT
COVER WAND RF STERILE (DRAPES) IMPLANT
CUP ACETBLR 54 OD PINNACLE (Hips) ×2 IMPLANT
DERMABOND ADVANCED (GAUZE/BANDAGES/DRESSINGS) ×1
DERMABOND ADVANCED .7 DNX12 (GAUZE/BANDAGES/DRESSINGS) ×1 IMPLANT
DRAPE STERI IOBAN 125X83 (DRAPES) ×2 IMPLANT
DRAPE U-SHAPE 47X51 STRL (DRAPES) ×4 IMPLANT
DRESSING AQUACEL AG SP 3.5X10 (GAUZE/BANDAGES/DRESSINGS) ×1 IMPLANT
DRSG AQUACEL AG ADV 3.5X10 (GAUZE/BANDAGES/DRESSINGS) ×2 IMPLANT
DRSG AQUACEL AG SP 3.5X10 (GAUZE/BANDAGES/DRESSINGS) ×2
DURAPREP 26ML APPLICATOR (WOUND CARE) ×2 IMPLANT
ELECT REM PT RETURN 15FT ADLT (MISCELLANEOUS) ×2 IMPLANT
ELIMINATOR HOLE APEX DEPUY (Hips) ×2 IMPLANT
GLOVE BIO SURGEON STRL SZ 6 (GLOVE) ×4 IMPLANT
GLOVE BIOGEL PI IND STRL 6.5 (GLOVE) ×1 IMPLANT
GLOVE BIOGEL PI IND STRL 7.5 (GLOVE) ×1 IMPLANT
GLOVE BIOGEL PI IND STRL 8.5 (GLOVE) ×1 IMPLANT
GLOVE BIOGEL PI INDICATOR 6.5 (GLOVE) ×1
GLOVE BIOGEL PI INDICATOR 7.5 (GLOVE) ×1
GLOVE BIOGEL PI INDICATOR 8.5 (GLOVE) ×1
GLOVE ECLIPSE 8.0 STRL XLNG CF (GLOVE) ×4 IMPLANT
GLOVE ORTHO TXT STRL SZ7.5 (GLOVE) ×4 IMPLANT
GOWN STRL REUS W/TWL LRG LVL3 (GOWN DISPOSABLE) ×4 IMPLANT
GOWN STRL REUS W/TWL XL LVL3 (GOWN DISPOSABLE) ×2 IMPLANT
HEAD CERAMIC 36 PLUS5 (Hips) ×2 IMPLANT
HOLDER FOLEY CATH W/STRAP (MISCELLANEOUS) ×2 IMPLANT
KIT TURNOVER KIT A (KITS) IMPLANT
LINER NEUTRAL 54X36MM PLUS 4 (Hips) ×2 IMPLANT
PACK ANTERIOR HIP CUSTOM (KITS) ×2 IMPLANT
PENCIL SMOKE EVACUATOR (MISCELLANEOUS) IMPLANT
SCREW 6.5MMX30MM (Screw) ×2 IMPLANT
STEM FEMORAL SZ 6MM STD ACTIS (Stem) ×2 IMPLANT
SUT MNCRL AB 4-0 PS2 18 (SUTURE) ×2 IMPLANT
SUT STRATAFIX 0 PDS 27 VIOLET (SUTURE) ×2
SUT VIC AB 1 CT1 36 (SUTURE) ×6 IMPLANT
SUT VIC AB 2-0 CT1 27 (SUTURE) ×4
SUT VIC AB 2-0 CT1 TAPERPNT 27 (SUTURE) ×2 IMPLANT
SUTURE STRATFX 0 PDS 27 VIOLET (SUTURE) ×1 IMPLANT
TRAY FOLEY MTR SLVR 16FR STAT (SET/KITS/TRAYS/PACK) IMPLANT
WATER STERILE IRR 1000ML POUR (IV SOLUTION) ×2 IMPLANT

## 2020-02-25 NOTE — Anesthesia Postprocedure Evaluation (Signed)
Anesthesia Post Note  Patient: CHANDLAR GUICE  Procedure(s) Performed: TOTAL HIP ARTHROPLASTY ANTERIOR APPROACH (Right Hip)     Patient location during evaluation: PACU Anesthesia Type: Spinal Level of consciousness: oriented and awake and alert Pain management: pain level controlled Vital Signs Assessment: post-procedure vital signs reviewed and stable Respiratory status: spontaneous breathing and respiratory function stable Cardiovascular status: blood pressure returned to baseline and stable Postop Assessment: no headache, no backache and no apparent nausea or vomiting Anesthetic complications: no   No complications documented.  Last Vitals:  Vitals:   02/25/20 0945 02/25/20 1000  BP: (!) 158/78 (!) 145/78  Pulse: (!) 59 66  Resp: 12 12  Temp:  (!) 36.3 C  SpO2: 97% 94%    Last Pain:  Vitals:   02/25/20 1000  TempSrc:   PainSc: Asleep                 Merlinda Frederick

## 2020-02-25 NOTE — Discharge Instructions (Addendum)
INSTRUCTIONS AFTER JOINT REPLACEMENT   o Remove items at home which could result in a fall. This includes throw rugs or furniture in walking pathways o ICE to the affected joint every three hours while awake for 30 minutes at a time, for at least the first 3-5 days, and then as needed for pain and swelling.  Continue to use ice for pain and swelling. You may notice swelling that will progress down to the foot and ankle.  This is normal after surgery.  Elevate your leg when you are not up walking on it.   o Continue to use the breathing machine you got in the hospital (incentive spirometer) which will help keep your temperature down.  It is common for your temperature to cycle up and down following surgery, especially at night when you are not up moving around and exerting yourself.  The breathing machine keeps your lungs expanded and your temperature down.   DIET:  As you were doing prior to hospitalization, we recommend a well-balanced diet.  DRESSING / WOUND CARE / SHOWERING  Keep the surgical dressing until follow up.  The dressing is water proof, so you can shower without any extra covering.  IF THE DRESSING FALLS OFF or the wound gets wet inside, change the dressing with sterile gauze.  Please use Leam Madero hand washing techniques before changing the dressing.  Do not use any lotions or creams on the incision until instructed by your surgeon.    ACTIVITY  o Increase activity slowly as tolerated, but follow the weight bearing instructions below.   o No driving for 6 weeks or until further direction given by your physician.  You cannot drive while taking narcotics.  o No lifting or carrying greater than 10 lbs. until further directed by your surgeon. o Avoid periods of inactivity such as sitting longer than an hour when not asleep. This helps prevent blood clots.  o You may return to work once you are authorized by your doctor.     WEIGHT BEARING   Weight bearing as tolerated with assist  device (walker, cane, etc) as directed, use it as long as suggested by your surgeon or therapist, typically at least 4-6 weeks.   EXERCISES  Results after joint replacement surgery are often greatly improved when you follow the exercise, range of motion and muscle strengthening exercises prescribed by your doctor. Safety measures are also important to protect the joint from further injury. Any time any of these exercises cause you to have increased pain or swelling, decrease what you are doing until you are comfortable again and then slowly increase them. If you have problems or questions, call your caregiver or physical therapist for advice.   Rehabilitation is important following a joint replacement. After just a few days of immobilization, the muscles of the leg can become weakened and shrink (atrophy).  These exercises are designed to build up the tone and strength of the thigh and leg muscles and to improve motion. Often times heat used for twenty to thirty minutes before working out will loosen up your tissues and help with improving the range of motion but do not use heat for the first two weeks following surgery (sometimes heat can increase post-operative swelling).   These exercises can be done on a training (exercise) mat, on the floor, on a table or on a bed. Use whatever works the best and is most comfortable for you.    Use music or television while you are exercising so that   the exercises are a pleasant break in your day. This will make your life better with the exercises acting as a break in your routine that you can look forward to.   Perform all exercises about fifteen times, three times per day or as directed.  You should exercise both the operative leg and the other leg as well.  Exercises include:   . Quad Sets - Tighten up the muscle on the front of the thigh (Quad) and hold for 5-10 seconds.   . Straight Leg Raises - With your knee straight (if you were given a brace, keep it on),  lift the leg to 60 degrees, hold for 3 seconds, and slowly lower the leg.  Perform this exercise against resistance later as your leg gets stronger.  . Leg Slides: Lying on your back, slowly slide your foot toward your buttocks, bending your knee up off the floor (only go as far as is comfortable). Then slowly slide your foot back down until your leg is flat on the floor again.  . Angel Wings: Lying on your back spread your legs to the side as far apart as you can without causing discomfort.  . Hamstring Strength:  Lying on your back, push your heel against the floor with your leg straight by tightening up the muscles of your buttocks.  Repeat, but this time bend your knee to a comfortable angle, and push your heel against the floor.  You may put a pillow under the heel to make it more comfortable if necessary.   A rehabilitation program following joint replacement surgery can speed recovery and prevent re-injury in the future due to weakened muscles. Contact your doctor or a physical therapist for more information on knee rehabilitation.    CONSTIPATION  Constipation is defined medically as fewer than three stools per week and severe constipation as less than one stool per week.  Even if you have a regular bowel pattern at home, your normal regimen is likely to be disrupted due to multiple reasons following surgery.  Combination of anesthesia, postoperative narcotics, change in appetite and fluid intake all can affect your bowels.   YOU MUST use at least one of the following options; they are listed in order of increasing strength to get the job done.  They are all available over the counter, and you may need to use some, POSSIBLY even all of these options:    Drink plenty of fluids (prune juice may be helpful) and high fiber foods Colace 100 mg by mouth twice a day  Senokot for constipation as directed and as needed Dulcolax (bisacodyl), take with full glass of water  Miralax (polyethylene glycol)  once or twice a day as needed.  If you have tried all these things and are unable to have a bowel movement in the first 3-4 days after surgery call either your surgeon or your primary doctor.    If you experience loose stools or diarrhea, hold the medications until you stool forms back up.  If your symptoms do not get better within 1 week or if they get worse, check with your doctor.  If you experience "the worst abdominal pain ever" or develop nausea or vomiting, please contact the office immediately for further recommendations for treatment.   ITCHING:  If you experience itching with your medications, try taking only a single pain pill, or even half a pain pill at a time.  You can also use Benadryl over the counter for itching or also to   help with sleep.   TED HOSE STOCKINGS:  Use stockings on both legs until for at least 2 weeks or as directed by physician office. They may be removed at night for sleeping.  MEDICATIONS:  See your medication summary on the "After Visit Summary" that nursing will review with you.  You may have some home medications which will be placed on hold until you complete the course of blood thinner medication.  It is important for you to complete the blood thinner medication as prescribed.  PRECAUTIONS:  If you experience chest pain or shortness of breath - call 911 immediately for transfer to the hospital emergency department.   If you develop a fever greater that 101 F, purulent drainage from wound, increased redness or drainage from wound, foul odor from the wound/dressing, or calf pain - CONTACT YOUR SURGEON.                                                   FOLLOW-UP APPOINTMENTS:  If you do not already have a post-op appointment, please call the office for an appointment to be seen by your surgeon.  Guidelines for how soon to be seen are listed in your "After Visit Summary", but are typically between 1-4 weeks after surgery.  OTHER INSTRUCTIONS:   Knee  Replacement:  Do not place pillow under knee, focus on keeping the knee straight while resting.    DENTAL ANTIBIOTICS:  In most cases prophylactic antibiotics for Dental procdeures after total joint surgery are not necessary.  Exceptions are as follows:  1. History of prior total joint infection  2. Severely immunocompromised (Organ Transplant, cancer chemotherapy, Rheumatoid biologic meds such as Humera)  3. Poorly controlled diabetes (A1C &gt; 8.0, blood glucose over 200)  If you have one of these conditions, contact your surgeon for an antibiotic prescription, prior to your dental procedure.   MAKE SURE YOU:  . Understand these instructions.  . Get help right away if you are not doing well or get worse.    Thank you for letting us be a part of your medical care team.  It is a privilege we respect greatly.  We hope these instructions will help you stay on track for a fast and full recovery!    

## 2020-02-25 NOTE — Anesthesia Procedure Notes (Signed)
Spinal  Patient location during procedure: OR Start time: 02/25/2020 7:16 AM End time: 02/25/2020 7:20 AM Staffing Performed: anesthesiologist  Anesthesiologist: Merlinda Frederick, MD Preanesthetic Checklist Completed: patient identified, IV checked, risks and benefits discussed, surgical consent, monitors and equipment checked, pre-op evaluation and timeout performed Spinal Block Patient position: sitting Prep: DuraPrep Patient monitoring: cardiac monitor, continuous pulse ox and blood pressure Approach: midline Location: L3-4 Injection technique: single-shot Needle Needle type: Pencan  Needle gauge: 24 G Needle length: 9 cm Additional Notes Functioning IV was confirmed and monitors were applied. Sterile prep and drape, including hand hygiene and sterile gloves were used. The patient was positioned and the spine was prepped. The skin was anesthetized with lidocaine.  Free flow of clear CSF was obtained prior to injecting local anesthetic into the CSF.  The spinal needle aspirated freely following injection.  The needle was carefully withdrawn.  The patient tolerated the procedure well.

## 2020-02-25 NOTE — Evaluation (Signed)
Physical Therapy Evaluation Patient Details Name: Ryan Wagner MRN: [id number] DOB: 1946-05-18 Today's Date: 02/25/2020   History of Present Illness  Patienti s 74 y.o. male s/p Rt THA anterior approach on 02/25/20 with PMH significant for MI, HTN, HLD, GERD, DM, CAD (s/p CABG in 2020), A-fib, OA, Lt TKA in 2016, ACDF C4-6 in 2019.     Clinical Impression  Ryan Wagner is a 74 y.o. male POD 0 s/p Rt THA. Patient reports independence with mobility at baseline. Patient is now limited by functional impairments (see PT problem list below) and requires min guard/supervision for transfers and gait with RW. Patient was able to ambulate ~30 feet with RW and supervision and cues for safe walker management. Patient educated on safe sequencing for stair mobility and verbalized safe guarding position for people assisting with mobility. Patient instructed in exercises to facilitate ROM and circulation. Patient will benefit from continued skilled PT interventions to address impairments and progress towards PLOF. Patient has met mobility goals at adequate level for discharge home; will continue to follow if pt continues acute stay to progress towards Mod I goals.     Follow Up Recommendations Follow surgeon's recommendation for DC plan and follow-up therapies    Equipment Recommendations  Rolling walker with 5" wheels    Recommendations for Other Services       Precautions / Restrictions Precautions Precautions: Fall Restrictions Weight Bearing Restrictions: No Other Position/Activity Restrictions: WBAT      Mobility  Bed Mobility Overal bed mobility: Needs Assistance Bed Mobility: Supine to Sit     Supine to sit: Supervision     General bed mobility comments: pt requries extra time and cues to use gait belt for Rt LE mobility.  Transfers Overall transfer level: Needs assistance Equipment used: Rolling walker (2 wheeled) Transfers: Sit to/from Stand Sit to Stand: Supervision          General transfer comment: cues for safe technique/hand placement with RW.   Ambulation/Gait Ambulation/Gait assistance: Min guard;Supervision Gait Distance (Feet): 130 Feet Assistive device: Rolling walker (2 wheeled) Gait Pattern/deviations: Step-to pattern;Decreased stride length;Decreased weight shift to right Gait velocity: decr   General Gait Details: cues for safe step pattern and proximity to RW. no overt LOB noted.   Stairs Stairs: Yes Stairs assistance: Min guard;Supervision Stair Management: One rail Left;With cane;Forwards;Step to pattern Number of Stairs: 6 (2x3) General stair comments: cues for safe sequencing "up with good, down with bad" and cane positioning. no overt LOB, pt performed second bout without cues. pt verbalized safe guarding position for family to provide.  Wheelchair Mobility    Modified Rankin (Stroke Patients Only)       Balance Overall balance assessment: Mild deficits observed, not formally tested                                           Pertinent Vitals/Pain Pain Assessment: 0-10 Pain Score: 2  Pain Location: Rt hip Pain Descriptors / Indicators: Aching;Discomfort;Sore Pain Intervention(s): Monitored during session;Limited activity within patient's tolerance;Premedicated before session;Repositioned;Ice applied    Home Living Family/patient expects to be discharged to:: Private residence Living Arrangements: Spouse/significant other Available Help at Discharge: Family Type of Home: House Home Access: Stairs to enter Entrance Stairs-Rails: Left Entrance Stairs-Number of Steps: 3-4 Home Layout: Two level;Full bath on main level;Able to live on main level with bedroom/bathroom Home Equipment: Shower seat -  built in;Walker - 4 wheels;Cane - single point Additional Comments: been sleeping on couch for last couple months    Prior Function Level of Independence: Independent               Hand Dominance    Dominant Hand: Right    Extremity/Trunk Assessment   Upper Extremity Assessment Upper Extremity Assessment: Overall WFL for tasks assessed    Lower Extremity Assessment Lower Extremity Assessment: Overall WFL for tasks assessed    Cervical / Trunk Assessment Cervical / Trunk Assessment: Normal  Communication   Communication: No difficulties  Cognition Arousal/Alertness: Awake/alert Behavior During Therapy: WFL for tasks assessed/performed Overall Cognitive Status: Within Functional Limits for tasks assessed                                        General Comments      Exercises Total Joint Exercises Ankle Circles/Pumps: AROM;Both;15 reps;Seated Quad Sets: AROM;Right;5 reps;Seated Short Arc Quad: AROM;Right;5 reps;Seated Heel Slides: AROM;Right;5 reps;Seated Long Arc Quad: AROM;Right;5 reps;Seated   Assessment/Plan    PT Assessment Patient needs continued PT services  PT Problem List Decreased strength;Decreased range of motion;Decreased activity tolerance;Decreased balance;Decreased mobility;Decreased knowledge of use of DME;Decreased knowledge of precautions;Pain       PT Treatment Interventions DME instruction;Gait training;Stair training;Functional mobility training;Therapeutic activities;Therapeutic exercise;Balance training;Patient/family education    PT Goals (Current goals can be found in the Care Plan section)  Acute Rehab PT Goals Patient Stated Goal: get home today PT Goal Formulation: With patient Time For Goal Achievement: 03/03/20 Potential to Achieve Goals: Good    Frequency 7X/week   Barriers to discharge        Co-evaluation               AM-PAC PT "6 Clicks" Mobility  Outcome Measure Help needed turning from your back to your side while in a flat bed without using bedrails?: None Help needed moving from lying on your back to sitting on the side of a flat bed without using bedrails?: A Little Help needed moving to  and from a bed to a chair (including a wheelchair)?: A Little Help needed standing up from a chair using your arms (e.g., wheelchair or bedside chair)?: A Little Help needed to walk in hospital room?: A Little Help needed climbing 3-5 steps with a railing? : A Little 6 Click Score: 19    End of Session Equipment Utilized During Treatment: Gait belt Activity Tolerance: Patient tolerated treatment well Patient left: in chair;with call bell/phone within reach Nurse Communication: Mobility status PT Visit Diagnosis: Muscle weakness (generalized) (M62.81);Difficulty in walking, not elsewhere classified (R26.2)    Time: 3159-4585 PT Time Calculation (min) (ACUTE ONLY): 39 min   Charges:   PT Evaluation $PT Eval Low Complexity: 1 Low PT Treatments $Gait Training: 8-22 mins $Therapeutic Exercise: 8-22 mins        Verner Mould, DPT Acute Rehabilitation Services  Office [phone] Pager [phone]  02/25/2020 1:58 PM

## 2020-02-25 NOTE — Transfer of Care (Signed)
Immediate Anesthesia Transfer of Care Note  Patient: Ryan Wagner  Procedure(s) Performed: Procedure(s) with comments: TOTAL HIP ARTHROPLASTY ANTERIOR APPROACH (Right) - 70 mins  Patient Location: PACU  Anesthesia Type:Spinal  Level of Consciousness:  sedated, patient cooperative and responds to stimulation  Airway & Oxygen Therapy:Patient Spontanous Breathing and Patient connected to face mask oxgen  Post-op Assessment:  Report given to PACU RN and Post -op Vital signs reviewed and stable  Post vital signs:  Reviewed and stable  Last Vitals:  Vitals:   02/25/20 0553 02/25/20 0907  BP: (!) 151/66 104/69  Pulse: 60 (!) 59  Resp: 16 13  Temp: 36.7 C 36.5 C  SpO2: 316% 742%    Complications: No apparent anesthesia complications

## 2020-02-25 NOTE — Interval H&P Note (Signed)
History and Physical Interval Note:  02/25/2020 7:04 AM  Ryan Wagner  has presented today for surgery, with the diagnosis of Right hip osteoarthritis.  The various methods of treatment have been discussed with the patient and family. After consideration of risks, benefits and other options for treatment, the patient has consented to  Procedure(s) with comments: TOTAL HIP ARTHROPLASTY ANTERIOR APPROACH (Right) - 70 mins as a surgical intervention.  The patient's history has been reviewed, patient examined, no change in status, stable for surgery.  I have reviewed the patient's chart and labs.  Questions were answered to the patient's satisfaction.     Mauri Pole

## 2020-02-25 NOTE — Op Note (Signed)
NAME:  Ryan Wagner                ACCOUNT NO.: [id number]      MEDICAL RECORD NO.: [id number]      FACILITY:  [facility]      PHYSICIAN:  Mauri Pole  DATE OF BIRTH:  02/10/1946     DATE OF PROCEDURE:  02/25/2020                                 OPERATIVE REPORT         PREOPERATIVE DIAGNOSIS: Right  hip osteoarthritis.      POSTOPERATIVE DIAGNOSIS:  Right hip osteoarthritis.      PROCEDURE:  Right total hip replacement through an anterior approach   utilizing DePuy THR system, component size 54 mm pinnacle cup, a size 36+4 neutral   Altrex liner, a size 6 standard Actis stem with a 36+4 delta ceramic   ball.      SURGEON:  Pietro Cassis. Alvan Dame, M.D.      ASSISTANT:  Danae Orleans, PA-C     ANESTHESIA:  Spinal.      SPECIMENS:  None.      COMPLICATIONS:  None.      BLOOD LOSS:  350 cc     DRAINS:  None.      INDICATION OF THE PROCEDURE:  JACQUESE HACKMAN is a 74 y.o. male who had   presented to office for evaluation of right hip pain.  Radiographs revealed   progressive degenerative changes with bone-on-bone   articulation of the  hip joint, including subchondral cystic changes and osteophytes.  The patient had painful limited range of   motion significantly affecting their overall quality of life and function.  The patient was failing to    respond to conservative measures including medications and/or injections and activity modification and at this point was ready   to proceed with more definitive measures.  Consent was obtained for   benefit of pain relief.  Specific risks of infection, DVT, component   failure, dislocation, neurovascular injury, and need for revision surgery were reviewed in the office as well discussion of   the anterior versus posterior approach were reviewed.     PROCEDURE IN DETAIL:  The patient was brought to operative theater.   Once adequate anesthesia, preoperative antibiotics, 2 gm of Ancef, 1 gm of Tranexamic  Acid, and 10 mg of Decadron were administered, the patient was positioned supine on the [employer] table.  Once the patient was safely positioned with adequate padding of boney prominences we predraped out the hip, and used fluoroscopy to confirm orientation of the pelvis.      The right hip was then prepped and draped from proximal iliac crest to   mid thigh with a shower curtain technique.      Time-out was performed identifying the patient, planned procedure, and the appropriate extremity.     An incision was then made 2 cm lateral to the   anterior superior iliac spine extending over the orientation of the   tensor fascia lata muscle and sharp dissection was carried down to the   fascia of the muscle.      The fascia was then incised.  The muscle belly was identified and swept   laterally and retractor placed along the superior neck.  Following   cauterization of the circumflex vessels and removing some  pericapsular   fat, a second cobra retractor was placed on the inferior neck.  A T-capsulotomy was made along the line of the   superior neck to the trochanteric fossa, then extended proximally and   distally.  Tag sutures were placed and the retractors were then placed   intracapsular.  We then identified the trochanteric fossa and   orientation of my neck cut and then made a neck osteotomy with the femur on traction.  The femoral   head was removed without difficulty or complication.  Traction was let   off and retractors were placed posterior and anterior around the   acetabulum.      The labrum and foveal tissue were debrided.  I began reaming with a 46 mm   reamer and reamed up to 53 mm reamer with good bony bed preparation and a 54 mm  cup was chosen.  The final 54 mm Pinnacle cup was then impacted under fluoroscopy to confirm the depth of penetration and orientation with respect to   Abduction and forward flexion.  A screw was placed into the ilium followed by the hole eliminator.   The final   36+4 neutral Altrex liner was impacted with good visualized rim fit.  The cup was positioned anatomically within the acetabular portion of the pelvis.      At this point, the femur was rolled to 100 degrees.  Further capsule was   released off the inferior aspect of the femoral neck.  I then   released the superior capsule proximally.  With the leg in a neutral position the hook was placed laterally   along the femur under the vastus lateralis origin and elevated manually and then held in position using the hook attachment on the bed.  The leg was then extended and adducted with the leg rolled to 100   degrees of external rotation.  Retractors were placed along the medial calcar and posteriorly over the greater trochanter.  Once the proximal femur was fully   exposed, I used a box osteotome to set orientation.  I then began   broaching with the starting chili pepper broach and passed this by hand and then broached up to 6.  With the 6 broach in place I chose a standard neck and did several trial reductions.  The offset was appropriate, leg lengths   appeared to be equal best matched with the +5 head ball trial confirmed radiographically.   Given these findings, I went ahead and dislocated the hip, repositioned all   retractors and positioned the right hip in the extended and abducted position.  The final 6 standard Actis stem was   chosen and it was impacted down to the level of neck cut.  Based on this   and the trial reductions, a final 36+5 delta ceramic ball was chosen and   impacted onto a clean and dry trunnion, and the hip was reduced.  The   hip had been irrigated throughout the case again at this point.  I did   reapproximate the superior capsular leaflet to the anterior leaflet   using #1 Vicryl.  The fascia of the   tensor fascia lata muscle was then reapproximated using #1 Vicryl and #0 Stratafix sutures.  The   remaining wound was closed with 2-0 Vicryl and running 4-0  Monocryl.   The hip was cleaned, dried, and dressed sterilely using Dermabond and   Aquacel dressing.  The patient was then brought   to recovery  room in stable condition tolerating the procedure well.    Danae Orleans, PA-C was present for the entirety of the case involved from   preoperative positioning, perioperative retractor management, general   facilitation of the case, as well as primary wound closure as assistant.            Pietro Cassis Alvan Dame, M.D.        02/25/2020 7:18 AM

## 2020-02-26 ENCOUNTER — Encounter (HOSPITAL_COMMUNITY): Payer: Self-pay | Admitting: Orthopedic Surgery

## 2020-03-02 DIAGNOSIS — E119 Type 2 diabetes mellitus without complications: Secondary | ICD-10-CM | POA: Diagnosis not present

## 2020-03-02 DIAGNOSIS — M179 Osteoarthritis of knee, unspecified: Secondary | ICD-10-CM | POA: Diagnosis not present

## 2020-03-02 DIAGNOSIS — I10 Essential (primary) hypertension: Secondary | ICD-10-CM | POA: Diagnosis not present

## 2020-03-02 DIAGNOSIS — I480 Paroxysmal atrial fibrillation: Secondary | ICD-10-CM | POA: Diagnosis not present

## 2020-03-02 DIAGNOSIS — I2510 Atherosclerotic heart disease of native coronary artery without angina pectoris: Secondary | ICD-10-CM | POA: Diagnosis not present

## 2020-03-02 DIAGNOSIS — E782 Mixed hyperlipidemia: Secondary | ICD-10-CM | POA: Diagnosis not present

## 2020-04-12 DIAGNOSIS — E119 Type 2 diabetes mellitus without complications: Secondary | ICD-10-CM | POA: Diagnosis not present

## 2020-04-12 DIAGNOSIS — I10 Essential (primary) hypertension: Secondary | ICD-10-CM | POA: Diagnosis not present

## 2020-04-12 DIAGNOSIS — E782 Mixed hyperlipidemia: Secondary | ICD-10-CM | POA: Diagnosis not present

## 2020-04-12 DIAGNOSIS — Z125 Encounter for screening for malignant neoplasm of prostate: Secondary | ICD-10-CM | POA: Diagnosis not present

## 2020-04-12 DIAGNOSIS — K219 Gastro-esophageal reflux disease without esophagitis: Secondary | ICD-10-CM | POA: Diagnosis not present

## 2020-04-12 DIAGNOSIS — Z0000 Encounter for general adult medical examination without abnormal findings: Secondary | ICD-10-CM | POA: Diagnosis not present

## 2020-04-12 DIAGNOSIS — I2510 Atherosclerotic heart disease of native coronary artery without angina pectoris: Secondary | ICD-10-CM | POA: Diagnosis not present

## 2020-04-13 DIAGNOSIS — Z471 Aftercare following joint replacement surgery: Secondary | ICD-10-CM | POA: Diagnosis not present

## 2020-04-13 DIAGNOSIS — Z96641 Presence of right artificial hip joint: Secondary | ICD-10-CM | POA: Diagnosis not present

## 2020-04-18 DIAGNOSIS — E782 Mixed hyperlipidemia: Secondary | ICD-10-CM | POA: Diagnosis not present

## 2020-04-18 DIAGNOSIS — I10 Essential (primary) hypertension: Secondary | ICD-10-CM | POA: Diagnosis not present

## 2020-04-18 DIAGNOSIS — M179 Osteoarthritis of knee, unspecified: Secondary | ICD-10-CM | POA: Diagnosis not present

## 2020-04-18 DIAGNOSIS — I480 Paroxysmal atrial fibrillation: Secondary | ICD-10-CM | POA: Diagnosis not present

## 2020-04-18 DIAGNOSIS — E119 Type 2 diabetes mellitus without complications: Secondary | ICD-10-CM | POA: Diagnosis not present

## 2020-04-18 DIAGNOSIS — K219 Gastro-esophageal reflux disease without esophagitis: Secondary | ICD-10-CM | POA: Diagnosis not present

## 2020-04-18 DIAGNOSIS — I2510 Atherosclerotic heart disease of native coronary artery without angina pectoris: Secondary | ICD-10-CM | POA: Diagnosis not present

## 2020-05-02 DIAGNOSIS — M179 Osteoarthritis of knee, unspecified: Secondary | ICD-10-CM | POA: Diagnosis not present

## 2020-05-02 DIAGNOSIS — I2510 Atherosclerotic heart disease of native coronary artery without angina pectoris: Secondary | ICD-10-CM | POA: Diagnosis not present

## 2020-05-02 DIAGNOSIS — I10 Essential (primary) hypertension: Secondary | ICD-10-CM | POA: Diagnosis not present

## 2020-05-02 DIAGNOSIS — I480 Paroxysmal atrial fibrillation: Secondary | ICD-10-CM | POA: Diagnosis not present

## 2020-05-02 DIAGNOSIS — E119 Type 2 diabetes mellitus without complications: Secondary | ICD-10-CM | POA: Diagnosis not present

## 2020-05-02 DIAGNOSIS — K219 Gastro-esophageal reflux disease without esophagitis: Secondary | ICD-10-CM | POA: Diagnosis not present

## 2020-05-02 DIAGNOSIS — E782 Mixed hyperlipidemia: Secondary | ICD-10-CM | POA: Diagnosis not present

## 2020-05-10 ENCOUNTER — Other Ambulatory Visit: Payer: Self-pay

## 2020-05-10 NOTE — Patient Outreach (Signed)
  [name]) Care Management Chronic Special Needs Program    05/10/2020  Name: Ryan Wagner, DOB: February 01, 1946  MRN: [id number]   Mr. [name] is enrolled in a chronic special needs plan for Diabetes. [facility] will continue to provide services for this member through 05/27/2020. The [facility] Care Management Team will assume care 05/28/2020.   Thea Silversmith, RN, MSN, [place] [phone]

## 2020-06-01 ENCOUNTER — Other Ambulatory Visit: Payer: Self-pay

## 2020-06-19 ENCOUNTER — Other Ambulatory Visit: Payer: Self-pay | Admitting: Cardiovascular Disease

## 2020-07-07 DIAGNOSIS — M1812 Unilateral primary osteoarthritis of first carpometacarpal joint, left hand: Secondary | ICD-10-CM | POA: Diagnosis not present

## 2020-07-07 DIAGNOSIS — M19042 Primary osteoarthritis, left hand: Secondary | ICD-10-CM | POA: Diagnosis not present

## 2020-07-11 ENCOUNTER — Encounter: Payer: Self-pay | Admitting: Cardiovascular Disease

## 2020-07-11 ENCOUNTER — Other Ambulatory Visit: Payer: Self-pay

## 2020-07-11 ENCOUNTER — Ambulatory Visit (INDEPENDENT_AMBULATORY_CARE_PROVIDER_SITE_OTHER): Payer: HMO | Admitting: Cardiovascular Disease

## 2020-07-11 DIAGNOSIS — I480 Paroxysmal atrial fibrillation: Secondary | ICD-10-CM

## 2020-07-11 DIAGNOSIS — E119 Type 2 diabetes mellitus without complications: Secondary | ICD-10-CM | POA: Diagnosis not present

## 2020-07-11 DIAGNOSIS — I10 Essential (primary) hypertension: Secondary | ICD-10-CM | POA: Diagnosis not present

## 2020-07-11 DIAGNOSIS — Z951 Presence of aortocoronary bypass graft: Secondary | ICD-10-CM | POA: Diagnosis not present

## 2020-07-11 DIAGNOSIS — I2510 Atherosclerotic heart disease of native coronary artery without angina pectoris: Secondary | ICD-10-CM

## 2020-07-11 DIAGNOSIS — E785 Hyperlipidemia, unspecified: Secondary | ICD-10-CM | POA: Diagnosis not present

## 2020-07-11 MED ORDER — EZETIMIBE 10 MG PO TABS: 10.0000 mg | ORAL_TABLET | Freq: Every day | ORAL | 3 refills | Status: DC

## 2020-07-11 NOTE — Patient Instructions (Signed)
Medication Instructions:   START EZETIMIBE 10 MG ONCE DAILY  *If you need a refill on your cardiac medications before your next appointment, please call your pharmacy*   Lab Work:  Your physician recommends that you HAVE LAB [place] IN MAY= CMET AND LIPID If you have labs (blood work) drawn today and your tests are completely normal, you will receive your results only by: [name] MyChart Message (if you have MyChart) OR . A paper copy in the mail If you have any lab test that is abnormal or we need to change your treatment, we will call you to review the results.   Follow-Up: At [facility], you and your health needs are our priority.  As part of our continuing mission to provide you with exceptional heart care, we have created designated Provider Care Teams.  These Care Teams include your primary Cardiologist (physician) and Advanced Practice Providers (APPs -  Physician Assistants and Nurse Practitioners) who all work together to provide you with the care you need, when you need it.  We recommend signing up for the patient portal called "MyChart".  Sign up information is provided on this After Visit Summary.  MyChart is used to connect with patients for Virtual Visits (Telemedicine).  Patients are able to view lab/test results, encounter notes, upcoming appointments, etc.  Non-urgent messages can be sent to your provider as well.   To learn more about what you can do with MyChart, go to [email or web address].    Your next appointment:   6 month(s)  The format for your next appointment:   In Person  Provider:   Shelva Majestic, MD

## 2020-07-11 NOTE — Progress Notes (Signed)
Patient ID: ARBOR Wagner, male   DOB: May 15, 1946, 75 y.o.   MRN: [id number]    Primary MD: Dr. Ernestine Conrad  HPI: Ryan Wagner is a 75 y.o. male whois a former patient of Dr. Chase Picket and last saw him in January 2010.  He establish cardiology care with me in February 2017.  He presents to the office today for a 14 month follow-up cardiology evaluation.  [name] has a history of CAD and suffered an inferior wall myocardial infarction on 11/21/2002 and 3 drug-eluting stents were placed in the RCA and at that time.He was also found to have  moderate to severe disease in the obtuse marginal system.  He underwent repeat cardiac catheterization by Dr. Rex Kras which revealed diffuse disease in the circumflex vessel not amenable to percutaneous coronary intervention which medical therapy was recommended.  A nuclear perfusion study was done in October 2007 showed fairly normal perfusion in all myocardial segments.  He initially had been on dual antiplatelet therapy, but his Plavix was stopped 2008.  When he last saw Dr. Rex Kras in January 2010 he was without recurrent anginal symptoms.  He is followed by Dr. Shirline Frees for primary medical care.  Additional problems include type 2 diabetes mellitus, hyperlipidemia, hypertension, arthritis.  There is also a history of hiatal hernia with esophageal stricture.  He had undergone left knee replacement surgery.  He had seen Dr. [name] last in November 2016.  At that time he was on metoprolol, tartrate 50 Milgram's twice a day, Lotrel 10/20 daily for blood pressure control.  He was on atorvastatin 40 mg for hyperlipidemia.  He has been taking Victoza for his diabetes mellitus.  Laboratory done at that time revealed a hemoglobin of 15.6, hematocrit of 45.6.  Hemoglobin A1c was 5.9.  Lipid studies revealed a QTC 137, TG 1:30, HDL 32, LDL 78, and non-HDL 104.  He had normal renal function with a BUN of 12 and a creatinine of 0.81.  LFTs were  normal.  TSH was normal at 0.90.    He has been seen by Rosalyn Gess, PA-C and Doreene Adas, [place].  He has an apple watch and this past summer he was to potential short periods of atrial fibrillation n which she was asymptomatic by his apple watch.  He wore an event monitor from August 21 through September 19.  The predominant rhythm was sinus rhythm with the fastest heart rate at 120 bpm.  These harborage heartbeat was 63 bpm.  He had rare PVCs with a burden of 3% and very rare PACs less than 1%.  There was one episode of nonsustained VT lasting 4 beats at a heart rate of 106.  He also was found to have several short-lived episodes of PAF with an AF burden of less than 1%.  His slowest heart rate was in atrial fibrillation at 32 bpm.  He was subsequently seen by Doreene Adas all he was wearing the monitor.  He underwent an echo Doppler study in March 24, 2018 which showed normal systolic function with EF of 55 to 60%.  He did not have any regional wall motion abnormalities.  There was grade 1 diastolic dysfunction and mild dilation of his RV.  Over the past several months he states he has felt well but is watched detected an episode of AF on one occasion in November and then again in December which lasted for approximately 2 days.  On May 11, 2018 he called the answering service and as  noted in telephone message with [name], NP, he was asymptomatic without chest pain shortness of breath or palpitations but he reported atrial fibrillation rates in the 50-100 range.  He was started on Eliquis 5 mg twice a day and was instructed to stop his aspirin.  The patient feels well.  He denies chest pain.    The patient was seen by Dr. [name] and underwent bypass grafting x4 on June 11, 2018 with a LIMA to his LAD, sequential saphenous vein to his OM1 and OM 2, and saphenous vein graft to his PDA of his RCA.  He also underwent Maze procedure and atrial clip for his atrial fibrillation. He did well postop, he did  have recurrent PAF the day prior todischargeand was kept an extra 24 hours and placed on amiodarone.   He was seen in the office 07/23/2018 and was doing well. He was in NSR. He was hypertensive and his medications were adjusted (taken off ACE secondary to cough and placed on ARB).  His medications have also been adjusted twice by our pharmacist. His losartan was increased to 100 mg daily and a few days later amlodipine 10 mg was added, he was previously on a combination of amlodipine and ACE inhibitor.   When last seen by Kerin Ransom, he was feeling well.  Blood pressures were stable at home.  He denied any chest pain or palpitations.  He is no longer on amiodarone.  He was supposed to start back rehab in late March, but unfortunately this was deferred due to the COVID-19 pandemic.   I last evaluated him in a telemedicine visit on September 22, 2018.  At that time he was walking every day up to 30 minutes.  He denied chest pain PND orthopnea.  When he was discharged from the hospital he was discharged on atorvastatin 80 mg daily but apparently when he went to pick up his prescription there was a prior prescription of 40 mg  He continued to be on Victoza and metformin for his diabetes mellitus.  I last saw him in December 2020 and since his prior evaluation he had remained stable and denied recurrent anginal symptomatology, palpitations, PND orthopnea.  He continued to be on amlodipine 10 mg, losartan 100 mg, metoprolol tartrate 25 mg twice a day for hypertension.  He has not been aware of any recurrent episodes of atrial fibrillation and continues to be on Eliquis 5 mg twice a day.  He was tolerating atorvastatin 80 mg for hyperlipidemia.  He has had issues with right leg sciatica.  He has seen Dr. Frederik Pear and is now on gabapentin.  He will need an MRI.  He has not been able to exercise due to his sciatica discomfort.   Since I last saw him, he has undergone hip surgery.  He is now able to go on  more prolonged walks.  He denies any chest pain.  He has continued to be on amlodipine 10 mg, losartan 100 mg daily, metoprolol 25 mg twice a day.  He apparently is now only on atorvastatin at 20 mg and takes coenzyme Q 10.  Apparently had developed some myalgias on the higher dose leading to dose reduction.  He is diabetic on Victoza and Metformin.  He presents for evaluation.  Past Medical History:  Diagnosis Date  . Arthritis    knees, hands  . Atrial fibrillation ([place])   . CAD (coronary artery disease)    a. prior MI in 2004 with DESx3 to RCA  .  Complication of anesthesia    had difficulty voiding after hemorrhoidectomy  . Diabetes mellitus without complication ([place])    type 2  . GERD (gastroesophageal reflux disease)    no longer with symptoms  . Hyperlipidemia   . Hypertension   . Myocardial infarction ([place])   . Sciatica    improved    Past Surgical History:  Procedure Laterality Date  . ADENOIDECTOMY    . ANTERIOR CERVICAL DECOMP/DISCECTOMY FUSION N/A 04/15/2018   Procedure: Cervical Four-Five Cervical Five-Six Anterior cervical decompression/discectomy/fusion;  Surgeon: Judith Part, MD;  Location: [facility];  Service: Neurosurgery;  Laterality: N/A;  anterior approach  . APPENDECTOMY    . BAND HEMORRHOIDECTOMY    . CARDIAC CATHETERIZATION    . CLIPPING OF ATRIAL APPENDAGE N/A 06/11/2018   Procedure: CLIPPING OF LEFT ATRIAL APPENDAGE using a 35 AtriCure Clip;  Surgeon: Grace Isaac, MD;  Location: [facility];  Service: Open Heart Surgery;  Laterality: N/A;  . COLONOSCOPY    . CORONARY ANGIOPLASTY  2004  . CORONARY ARTERY BYPASS GRAFT N/A 06/11/2018   Procedure: CORONARY ARTERY BYPASS GRAFTING (CABG) x4 using left internal mammary artery and right greater saphenous vein harvested endoscopically. LIMA to distal LAD, Seq SVG to 1st & 2nd OM, SVG to distal PD;  Surgeon: Grace Isaac, MD;  Location: [facility];  Service: Open Heart Surgery;  Laterality: N/A;  . ESOPHAGEAL  DILATION    . KNEE SURGERY Left   . LEFT HEART CATH AND CORONARY ANGIOGRAPHY N/A 06/10/2018   Procedure: LEFT HEART CATH AND CORONARY ANGIOGRAPHY;  Surgeon: Troy Sine, MD;  Location: [facility];  Service: Cardiovascular;  Laterality: N/A;  . LIPOMA EXCISION     neck  . MAZE N/A 06/11/2018   Procedure: Pulmonary isolation MAZE using bipolar ablation;  Surgeon: Grace Isaac, MD;  Location: [facility];  Service: Open Heart Surgery;  Laterality: N/A;  . TEE WITHOUT CARDIOVERSION N/A 06/11/2018   Procedure: TRANSESOPHAGEAL ECHOCARDIOGRAM (TEE);  Surgeon: Grace Isaac, MD;  Location: [facility];  Service: Open Heart Surgery;  Laterality: N/A;  . TONSILLECTOMY    . TOTAL HIP ARTHROPLASTY Right 02/25/2020   Procedure: TOTAL HIP ARTHROPLASTY ANTERIOR APPROACH;  Surgeon: Paralee Cancel, MD;  Location: [facility];  Service: Orthopedics;  Laterality: Right;  70 mins  . TOTAL KNEE ARTHROPLASTY Left 06/01/2014   Procedure: TOTAL KNEE ARTHROPLASTY;  Surgeon: Hessie Dibble, MD;  Location: [facility];  Service: Orthopedics;  Laterality: Left;    Allergies  Allergen Reactions  . Ace Inhibitors Cough    Current Outpatient Medications  Medication Sig Dispense Refill  . amLODipine (NORVASC) 10 MG tablet TAKE ONE TABLET BY MOUTH ONCE DAILY 30 tablet 11  . atorvastatin (LIPITOR) 20 MG tablet Take 20 mg by mouth daily.    . Coenzyme Q10 (CO Q 10) 100 MG CAPS Take 100 mg by mouth daily.     [name] docusate sodium (COLACE) 100 MG capsule Take 1 capsule (100 mg total) by mouth 2 (two) times daily. 28 capsule 0  . esomeprazole (NEXIUM) 20 MG capsule Take 20 mg by mouth daily.     [name] ezetimibe (ZETIA) 10 MG tablet Take 1 tablet (10 mg total) by mouth daily. 90 tablet 3  . Krill Oil 500 MG CAPS Take 500 mg by mouth daily.    [name] L-Arginine 1000 MG TABS Take 1,000 mg by mouth daily.     [name] latanoprost (XALATAN) 0.005 % ophthalmic solution Place 1 drop  into both eyes at bedtime.    . Liraglutide (VICTOZA) 18 MG/3ML SOLN  injection Inject 1.2 mg into the skin daily.     [name] losartan (COZAAR) 100 MG tablet TAKE ONE TABLET BY MOUTH ONCE DAILY 90 tablet 2  . MAGNESIUM GLUCONATE PO Take 150 mg by mouth daily.    . Menaquinone-7 (VITAMIN K2 PO) Take 1 capsule by mouth daily.    . metFORMIN (GLUCOPHAGE) 500 MG tablet Take 500 mg by mouth 2 (two) times daily with a meal.    . methocarbamol (ROBAXIN) 500 MG tablet Take 1 tablet (500 mg total) by mouth every 6 (six) hours as needed for muscle spasms. 40 tablet 0  . metoprolol tartrate (LOPRESSOR) 25 MG tablet TAKE 1 TABLET (25 MG TOTAL) BY MOUTH 2 (TWO) TIMES DAILY. 180 tablet 3  . Multiple Vitamin (MULTIVITAMIN) tablet Take 1 tablet by mouth daily.    . polyethylene glycol (MIRALAX / GLYCOLAX) 17 g packet Take 17 g by mouth 2 (two) times daily. 28 packet 0  . Turmeric 500 MG CAPS Take 500 mg by mouth daily.    . Vitamin D3 (VITAMIN D) 25 MCG tablet Take 2,000 Units by mouth daily.     . Zinc 50 MG CAPS Take 50 mg by mouth daily.      No current facility-administered medications for this visit.    Social History   Socioeconomic History  . Marital status: Married    Spouse name: Not on file  . Number of children: Not on file  . Years of education: Not on file  . Highest education level: Not on file  Occupational History  . Not on file  Tobacco Use  . Smoking status: Never Smoker  . Smokeless tobacco: Never Used  Vaping Use  . Vaping Use: Never used  Substance and Sexual Activity  . Alcohol use: Yes    Comment: occasional  . Drug use: No  . Sexual activity: Not on file  Other Topics Concern  . Not on file  Social History Narrative  . Not on file   Social Determinants of Health   Financial Resource Strain: Not on file  Food Insecurity: No Food Insecurity  . Worried About Charity fundraiser in the Last Year: Never true  . Ran Out of Food in the Last Year: Never true  Transportation Needs: No Transportation Needs  . Lack of Transportation (Medical):  No  . Lack of Transportation (Non-Medical): No  Physical Activity: Not on file  Stress: Not on file  Social Connections: Not on file  Intimate Partner Violence: Not on file   Additional social history is notable that he is married since 8.  He has 2 children 5 grandchildren.  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  He attended [employer] for college.  There is no tobacco.  He drinks occasional wine.  He does walk but not regularly.  Family History  Problem Relation Age of Onset  . Dementia Mother   . Thyroid disease Mother   . Dementia Father   . High Cholesterol Father    Additional family history is notable that both parents died at age 51.  His father died of pneumonia.  He has one sister age 61, who is healthy.  His children are age 74 and 26.  ROS General: Negative; No fevers, chills, or night sweats HEENT: Negative; No changes in vision or hearing, sinus congestion, difficulty swallowing Pulmonary: Negative; No cough, wheezing, shortness of  breath, hemoptysis Cardiovascular: See HPI GI: Negative; No nausea, vomiting, diarrhea, or abdominal pain GU: Negative; No dysuria, hematuria, or difficulty voiding Musculoskeletal: Status post hip surgery Hematologic: Negative; no easy bruising, bleeding Endocrine: Negative; no heat/cold intolerance; no diabetes, Neuro: Positive for right lower extremity sciatica symptoms Skin: Negative; No rashes or skin lesions Psychiatric: Negative; No behavioral problems, depression Sleep: Negative; No snoring,  daytime sleepiness, hypersomnolence, bruxism, restless legs, hypnogognic hallucinations. Other comprehensive 14 point system review is negative   Physical Exam BP (!) 146/60   Pulse (!) 59   Ht _0  (1.702 m)   Wt 198 lb 3.2 oz (89.9 kg)   SpO2 98%   BMI 31.04 kg/m    Repeat blood pressure by me 128/64  Wt Readings from Last 3 Encounters:  07/11/20 198 lb 3.2 oz (89.9 kg)  02/25/20 200 lb 1 oz (90.7 kg)  02/18/20  200 lb 1 oz (90.7 kg)   General: Alert, oriented, no distress.  Skin: normal turgor, no rashes, warm and dry HEENT: Normocephalic, atraumatic. Pupils equal round and reactive to light; sclera anicteric; extraocular muscles intact;  Nose without nasal septal hypertrophy Mouth/Parynx benign; Mallinpatti scale 3 Neck: No JVD, no carotid bruits; normal carotid upstroke Lungs: clear to ausculatation and percussion; no wheezing or rales Chest wall: without tenderness to palpitation Heart: PMI not displaced, RRR, s1 s2 normal, 1/6 systolic murmur, no diastolic murmur, no rubs, gallops, thrills, or heaves Abdomen: soft, nontender; no hepatosplenomehaly, BS+; abdominal aorta nontender and not dilated by palpation. Back: no CVA tenderness Pulses 2+ Musculoskeletal: full range of motion, normal strength, no joint deformities Extremities: no clubbing cyanosis or edema, Homan's sign negative  Neurologic: grossly nonfocal; Cranial nerves grossly wnl Psychologic: Normal mood and affect   ECG (independently read by me): Sinus bradycardia at 59, mild sinun arrythmia, 1st degree block PR 212  December 20202 ECG (independently read by me): Sinus bradycardia at 47 with first-degree AV block.  Nonspecific ST-T changes.  January 2020 ECG (independently read by me): Sinus bradycardia at 50 bpm; first-degree AV block.  Q wave in lead III with new inferior and anterolteral T wave abnormalities which are new since his last ECG of 01/29/2018.  February 2017 ECG (independently read by me): Sinus bradycardia 59 bpm.  First-degree AV block with a PR interval at 232 ms.  No significant ST-T changes.   LABS: I personally reviewed the laboratory done at [facility] by Dr Viona Gilmore. Azalia Bilis as noted above.  BMP Latest Ref Rng & Units 02/18/2020 08/13/2018 06/15/2018  Glucose 70 - 99 mg/dL 121(H) 132(H) 91  BUN 8 - 23 mg/dL _1 Creatinine 0.61 - 1.24 mg/dL 0.86 0.87 1.16  BUN/Creat Ratio 10 - 24 - 14 -  Sodium 135 - 145  mmol/L 139 139 136  Potassium 3.5 - 5.1 mmol/L 4.1 4.3 4.1  Chloride 98 - 111 mmol/L 106 102 103  CO2 22 - 32 mmol/L _2 Calcium 8.9 - 10.3 mg/dL 8.4(L) 9.1 7.9(L)     Hepatic Function Latest Ref Rng & Units 06/10/2018 06/05/2018  Total Protein 6.5 - 8.1 g/dL 6.2(L) 6.2  Albumin 3.5 - 5.0 g/dL 3.8 4.2  AST 15 - 41 U/L 19 15  ALT 0 - 44 U/L 26 24  Alk Phosphatase 38 - 126 U/L 71 89  Total Bilirubin 0.3 - 1.2 mg/dL 1.2 0.7    CBC Latest Ref Rng & Units 02/18/2020 06/14/2018 06/13/2018  WBC 4.0 - 10.5 K/uL 7.4 15.1(H)  17.5(H)  Hemoglobin 13.0 - 17.0 g/dL 13.2 10.2(L) 9.9(L)  Hematocrit 39.0 - 52.0 % 41.3 30.7(L) 30.1(L)  Platelets 150 - 400 K/uL 252 162 128(L)   Lab Results  Component Value Date   MCV 83.1 02/18/2020   MCV 87.7 06/14/2018   MCV 89.3 06/13/2018    Lab Results  Component Value Date   TSH 0.997 06/05/2018    BNP No results found for: BNP  ProBNP No results found for: PROBNP   Lipid Panel  No results found for: CHOL, TRIG, HDL, CHOLHDL, VLDL, LDLCALC, LDLDIRECT   RADIOLOGY: No results found.  IMPRESSION:  1. CAD in native artery   2. Hx of CABG   3. Hyperlipidemia LDL goal <70   4. Paroxysmal atrial fibrillation (HCC)   5. Essential hypertension   6. Controlled type 2 diabetes mellitus without complication, without long-term current use of insulin ([place])     ASSESSMENT AND PLAN: Mr. Quan Cybulski is a 75 year old gentleman who suffered an inferior wall myocardial infarction in 2004 and was treated successfully with insertion of 3 stents in his RCA.  He had diffuse disease in his circumflex vessel.  A nuclear perfusion study in 2007 showed fairly normal perfusion.  He has a history of hypertension as well as diabetes mellitus.  He underwent CABG revascularization in June 11, 2018 with a LIMA to LAD, sequential SVG to OM1 and OM 2, and SVG to PDA and RCA.  He also underwent a Maze procedure and atrial clip for his atrial fibrillation.   Presently, he has not had any anginal symptoms since his bypass surgery and is unaware of any recurrent episodes of atrial fibrillation.  His blood pressure today is stable on current therapy including amlodipine 10 mg, losartan 100 mg, and metoprolol 25 mg twice a day.  He tolerated right hip surgery by Dr. Paralee Cancel on February 25, 2020 and is now able to walk without discomfort.  Since I last saw him his dose of atorvastatin apparently had been reduced to 20 mg.  I reviewed recent laboratory and I have recommended the addition of Zetia 10 mg to his atorvastatin in attempt to achieve an LDL less than 70 since this was in the 80s when checked by his primary physician.  In 3 months I will recheck a chemistry profile lipid studies and contact him regarding the results.  I will see him in 6 months for reevaluation   Troy Sine, MD, [facility]  07/13/2020 1:45 PM

## 2020-07-13 ENCOUNTER — Encounter: Payer: Self-pay | Admitting: Cardiovascular Disease

## 2020-07-14 DIAGNOSIS — Z96641 Presence of right artificial hip joint: Secondary | ICD-10-CM | POA: Diagnosis not present

## 2020-07-14 DIAGNOSIS — M5416 Radiculopathy, lumbar region: Secondary | ICD-10-CM | POA: Diagnosis not present

## 2020-07-21 DIAGNOSIS — M13842 Other specified arthritis, left hand: Secondary | ICD-10-CM | POA: Diagnosis not present

## 2020-07-22 DIAGNOSIS — M179 Osteoarthritis of knee, unspecified: Secondary | ICD-10-CM | POA: Diagnosis not present

## 2020-07-22 DIAGNOSIS — E782 Mixed hyperlipidemia: Secondary | ICD-10-CM | POA: Diagnosis not present

## 2020-07-22 DIAGNOSIS — I2510 Atherosclerotic heart disease of native coronary artery without angina pectoris: Secondary | ICD-10-CM | POA: Diagnosis not present

## 2020-07-22 DIAGNOSIS — E119 Type 2 diabetes mellitus without complications: Secondary | ICD-10-CM | POA: Diagnosis not present

## 2020-07-22 DIAGNOSIS — K219 Gastro-esophageal reflux disease without esophagitis: Secondary | ICD-10-CM | POA: Diagnosis not present

## 2020-07-22 DIAGNOSIS — I480 Paroxysmal atrial fibrillation: Secondary | ICD-10-CM | POA: Diagnosis not present

## 2020-07-22 DIAGNOSIS — I10 Essential (primary) hypertension: Secondary | ICD-10-CM | POA: Diagnosis not present

## 2020-08-25 DIAGNOSIS — I480 Paroxysmal atrial fibrillation: Secondary | ICD-10-CM | POA: Diagnosis not present

## 2020-08-25 DIAGNOSIS — E782 Mixed hyperlipidemia: Secondary | ICD-10-CM | POA: Diagnosis not present

## 2020-08-25 DIAGNOSIS — M179 Osteoarthritis of knee, unspecified: Secondary | ICD-10-CM | POA: Diagnosis not present

## 2020-08-25 DIAGNOSIS — M5416 Radiculopathy, lumbar region: Secondary | ICD-10-CM | POA: Diagnosis not present

## 2020-08-25 DIAGNOSIS — K219 Gastro-esophageal reflux disease without esophagitis: Secondary | ICD-10-CM | POA: Diagnosis not present

## 2020-08-25 DIAGNOSIS — Z96641 Presence of right artificial hip joint: Secondary | ICD-10-CM | POA: Diagnosis not present

## 2020-08-25 DIAGNOSIS — E119 Type 2 diabetes mellitus without complications: Secondary | ICD-10-CM | POA: Diagnosis not present

## 2020-08-25 DIAGNOSIS — I10 Essential (primary) hypertension: Secondary | ICD-10-CM | POA: Diagnosis not present

## 2020-08-25 DIAGNOSIS — I2510 Atherosclerotic heart disease of native coronary artery without angina pectoris: Secondary | ICD-10-CM | POA: Diagnosis not present

## 2020-08-30 ENCOUNTER — Ambulatory Visit: Payer: HMO

## 2020-10-04 DIAGNOSIS — E119 Type 2 diabetes mellitus without complications: Secondary | ICD-10-CM | POA: Diagnosis not present

## 2020-10-12 ENCOUNTER — Other Ambulatory Visit: Payer: Self-pay | Admitting: Physician Assistant

## 2020-11-21 DIAGNOSIS — I10 Essential (primary) hypertension: Secondary | ICD-10-CM | POA: Diagnosis not present

## 2020-11-21 DIAGNOSIS — I2510 Atherosclerotic heart disease of native coronary artery without angina pectoris: Secondary | ICD-10-CM | POA: Diagnosis not present

## 2020-11-21 DIAGNOSIS — M5441 Lumbago with sciatica, right side: Secondary | ICD-10-CM | POA: Diagnosis not present

## 2020-11-21 DIAGNOSIS — E782 Mixed hyperlipidemia: Secondary | ICD-10-CM | POA: Diagnosis not present

## 2020-11-21 DIAGNOSIS — E1169 Type 2 diabetes mellitus with other specified complication: Secondary | ICD-10-CM | POA: Diagnosis not present

## 2020-11-21 DIAGNOSIS — M179 Osteoarthritis of knee, unspecified: Secondary | ICD-10-CM | POA: Diagnosis not present

## 2021-01-03 DIAGNOSIS — H6123 Impacted cerumen, bilateral: Secondary | ICD-10-CM | POA: Diagnosis not present

## 2021-01-07 ENCOUNTER — Other Ambulatory Visit: Payer: Self-pay | Admitting: Physician Assistant

## 2021-01-16 DIAGNOSIS — E782 Mixed hyperlipidemia: Secondary | ICD-10-CM | POA: Diagnosis not present

## 2021-01-16 DIAGNOSIS — E1169 Type 2 diabetes mellitus with other specified complication: Secondary | ICD-10-CM | POA: Diagnosis not present

## 2021-01-16 DIAGNOSIS — I2510 Atherosclerotic heart disease of native coronary artery without angina pectoris: Secondary | ICD-10-CM | POA: Diagnosis not present

## 2021-01-16 DIAGNOSIS — K219 Gastro-esophageal reflux disease without esophagitis: Secondary | ICD-10-CM | POA: Diagnosis not present

## 2021-01-16 DIAGNOSIS — M179 Osteoarthritis of knee, unspecified: Secondary | ICD-10-CM | POA: Diagnosis not present

## 2021-01-16 DIAGNOSIS — I10 Essential (primary) hypertension: Secondary | ICD-10-CM | POA: Diagnosis not present

## 2021-01-16 DIAGNOSIS — I480 Paroxysmal atrial fibrillation: Secondary | ICD-10-CM | POA: Diagnosis not present

## 2021-02-15 DIAGNOSIS — H401131 Primary open-angle glaucoma, bilateral, mild stage: Secondary | ICD-10-CM | POA: Diagnosis not present

## 2021-04-07 ENCOUNTER — Other Ambulatory Visit: Payer: Self-pay | Admitting: Cardiovascular Disease

## 2021-04-14 DIAGNOSIS — I480 Paroxysmal atrial fibrillation: Secondary | ICD-10-CM | POA: Diagnosis not present

## 2021-04-14 DIAGNOSIS — E1169 Type 2 diabetes mellitus with other specified complication: Secondary | ICD-10-CM | POA: Diagnosis not present

## 2021-04-14 DIAGNOSIS — I10 Essential (primary) hypertension: Secondary | ICD-10-CM | POA: Diagnosis not present

## 2021-04-14 DIAGNOSIS — E782 Mixed hyperlipidemia: Secondary | ICD-10-CM | POA: Diagnosis not present

## 2021-04-14 DIAGNOSIS — M179 Osteoarthritis of knee, unspecified: Secondary | ICD-10-CM | POA: Diagnosis not present

## 2021-04-14 DIAGNOSIS — K219 Gastro-esophageal reflux disease without esophagitis: Secondary | ICD-10-CM | POA: Diagnosis not present

## 2021-04-14 DIAGNOSIS — I2510 Atherosclerotic heart disease of native coronary artery without angina pectoris: Secondary | ICD-10-CM | POA: Diagnosis not present

## 2021-04-17 DIAGNOSIS — I2510 Atherosclerotic heart disease of native coronary artery without angina pectoris: Secondary | ICD-10-CM | POA: Diagnosis not present

## 2021-04-17 DIAGNOSIS — Z125 Encounter for screening for malignant neoplasm of prostate: Secondary | ICD-10-CM | POA: Diagnosis not present

## 2021-04-17 DIAGNOSIS — I10 Essential (primary) hypertension: Secondary | ICD-10-CM | POA: Diagnosis not present

## 2021-04-17 DIAGNOSIS — Z0000 Encounter for general adult medical examination without abnormal findings: Secondary | ICD-10-CM | POA: Diagnosis not present

## 2021-04-17 DIAGNOSIS — E1169 Type 2 diabetes mellitus with other specified complication: Secondary | ICD-10-CM | POA: Diagnosis not present

## 2021-04-17 DIAGNOSIS — E782 Mixed hyperlipidemia: Secondary | ICD-10-CM | POA: Diagnosis not present

## 2021-04-17 DIAGNOSIS — M179 Osteoarthritis of knee, unspecified: Secondary | ICD-10-CM | POA: Diagnosis not present

## 2021-04-17 DIAGNOSIS — K219 Gastro-esophageal reflux disease without esophagitis: Secondary | ICD-10-CM | POA: Diagnosis not present

## 2021-05-15 DIAGNOSIS — E782 Mixed hyperlipidemia: Secondary | ICD-10-CM | POA: Diagnosis not present

## 2021-05-15 DIAGNOSIS — I10 Essential (primary) hypertension: Secondary | ICD-10-CM | POA: Diagnosis not present

## 2021-05-15 DIAGNOSIS — E1169 Type 2 diabetes mellitus with other specified complication: Secondary | ICD-10-CM | POA: Diagnosis not present

## 2021-06-21 DIAGNOSIS — H401131 Primary open-angle glaucoma, bilateral, mild stage: Secondary | ICD-10-CM | POA: Diagnosis not present

## 2021-07-13 DIAGNOSIS — I10 Essential (primary) hypertension: Secondary | ICD-10-CM | POA: Diagnosis not present

## 2021-07-13 DIAGNOSIS — E1169 Type 2 diabetes mellitus with other specified complication: Secondary | ICD-10-CM | POA: Diagnosis not present

## 2021-07-13 DIAGNOSIS — E782 Mixed hyperlipidemia: Secondary | ICD-10-CM | POA: Diagnosis not present

## 2021-08-02 DIAGNOSIS — L820 Inflamed seborrheic keratosis: Secondary | ICD-10-CM | POA: Diagnosis not present

## 2021-08-09 DIAGNOSIS — I10 Essential (primary) hypertension: Secondary | ICD-10-CM | POA: Diagnosis not present

## 2021-08-09 DIAGNOSIS — E782 Mixed hyperlipidemia: Secondary | ICD-10-CM | POA: Diagnosis not present

## 2021-08-09 DIAGNOSIS — E1169 Type 2 diabetes mellitus with other specified complication: Secondary | ICD-10-CM | POA: Diagnosis not present

## 2021-10-02 ENCOUNTER — Other Ambulatory Visit: Payer: Self-pay | Admitting: Cardiovascular Disease

## 2021-10-09 DIAGNOSIS — I10 Essential (primary) hypertension: Secondary | ICD-10-CM | POA: Diagnosis not present

## 2021-10-09 DIAGNOSIS — I480 Paroxysmal atrial fibrillation: Secondary | ICD-10-CM | POA: Diagnosis not present

## 2021-10-09 DIAGNOSIS — K219 Gastro-esophageal reflux disease without esophagitis: Secondary | ICD-10-CM | POA: Diagnosis not present

## 2021-10-09 DIAGNOSIS — I2510 Atherosclerotic heart disease of native coronary artery without angina pectoris: Secondary | ICD-10-CM | POA: Diagnosis not present

## 2021-10-09 DIAGNOSIS — E1169 Type 2 diabetes mellitus with other specified complication: Secondary | ICD-10-CM | POA: Diagnosis not present

## 2021-10-09 DIAGNOSIS — E782 Mixed hyperlipidemia: Secondary | ICD-10-CM | POA: Diagnosis not present

## 2021-10-09 DIAGNOSIS — H401112 Primary open-angle glaucoma, right eye, moderate stage: Secondary | ICD-10-CM | POA: Diagnosis not present

## 2021-10-19 DIAGNOSIS — E1169 Type 2 diabetes mellitus with other specified complication: Secondary | ICD-10-CM | POA: Diagnosis not present

## 2021-10-19 DIAGNOSIS — I10 Essential (primary) hypertension: Secondary | ICD-10-CM | POA: Diagnosis not present

## 2021-10-19 DIAGNOSIS — I2510 Atherosclerotic heart disease of native coronary artery without angina pectoris: Secondary | ICD-10-CM | POA: Diagnosis not present

## 2021-10-19 DIAGNOSIS — E782 Mixed hyperlipidemia: Secondary | ICD-10-CM | POA: Diagnosis not present

## 2021-10-31 DIAGNOSIS — H60392 Other infective otitis externa, left ear: Secondary | ICD-10-CM | POA: Diagnosis not present

## 2021-10-31 DIAGNOSIS — I10 Essential (primary) hypertension: Secondary | ICD-10-CM | POA: Diagnosis not present

## 2021-10-31 DIAGNOSIS — H6122 Impacted cerumen, left ear: Secondary | ICD-10-CM | POA: Diagnosis not present

## 2021-11-09 DIAGNOSIS — E782 Mixed hyperlipidemia: Secondary | ICD-10-CM | POA: Diagnosis not present

## 2021-11-09 DIAGNOSIS — I10 Essential (primary) hypertension: Secondary | ICD-10-CM | POA: Diagnosis not present

## 2021-11-09 DIAGNOSIS — E1169 Type 2 diabetes mellitus with other specified complication: Secondary | ICD-10-CM | POA: Diagnosis not present

## 2021-11-17 DIAGNOSIS — H43813 Vitreous degeneration, bilateral: Secondary | ICD-10-CM | POA: Diagnosis not present

## 2021-11-17 DIAGNOSIS — H401112 Primary open-angle glaucoma, right eye, moderate stage: Secondary | ICD-10-CM | POA: Diagnosis not present

## 2021-11-17 DIAGNOSIS — E119 Type 2 diabetes mellitus without complications: Secondary | ICD-10-CM | POA: Diagnosis not present

## 2021-11-17 DIAGNOSIS — H2511 Age-related nuclear cataract, right eye: Secondary | ICD-10-CM | POA: Diagnosis not present

## 2021-11-17 DIAGNOSIS — H401121 Primary open-angle glaucoma, left eye, mild stage: Secondary | ICD-10-CM | POA: Diagnosis not present

## 2022-01-23 DIAGNOSIS — H401112 Primary open-angle glaucoma, right eye, moderate stage: Secondary | ICD-10-CM | POA: Diagnosis not present

## 2022-01-23 DIAGNOSIS — H2511 Age-related nuclear cataract, right eye: Secondary | ICD-10-CM | POA: Diagnosis not present

## 2022-04-07 ENCOUNTER — Other Ambulatory Visit: Payer: Self-pay | Admitting: Cardiovascular Disease

## 2022-05-05 ENCOUNTER — Other Ambulatory Visit: Payer: Self-pay | Admitting: Cardiovascular Disease

## 2022-06-06 DIAGNOSIS — H401121 Primary open-angle glaucoma, left eye, mild stage: Secondary | ICD-10-CM | POA: Diagnosis not present

## 2022-06-06 DIAGNOSIS — H401112 Primary open-angle glaucoma, right eye, moderate stage: Secondary | ICD-10-CM | POA: Diagnosis not present

## 2022-06-06 DIAGNOSIS — H43813 Vitreous degeneration, bilateral: Secondary | ICD-10-CM | POA: Diagnosis not present

## 2022-06-06 DIAGNOSIS — Z961 Presence of intraocular lens: Secondary | ICD-10-CM | POA: Diagnosis not present

## 2022-06-06 DIAGNOSIS — D492 Neoplasm of unspecified behavior of bone, soft tissue, and skin: Secondary | ICD-10-CM | POA: Diagnosis not present

## 2022-06-06 DIAGNOSIS — H2512 Age-related nuclear cataract, left eye: Secondary | ICD-10-CM | POA: Diagnosis not present

## 2022-06-06 DIAGNOSIS — E119 Type 2 diabetes mellitus without complications: Secondary | ICD-10-CM | POA: Diagnosis not present

## 2022-06-20 ENCOUNTER — Other Ambulatory Visit: Payer: Self-pay | Admitting: Cardiovascular Disease

## 2022-07-06 DIAGNOSIS — H2512 Age-related nuclear cataract, left eye: Secondary | ICD-10-CM | POA: Diagnosis not present

## 2022-07-10 DIAGNOSIS — H401122 Primary open-angle glaucoma, left eye, moderate stage: Secondary | ICD-10-CM | POA: Diagnosis not present

## 2022-07-10 DIAGNOSIS — H2512 Age-related nuclear cataract, left eye: Secondary | ICD-10-CM | POA: Diagnosis not present

## 2022-08-16 ENCOUNTER — Other Ambulatory Visit (HOSPITAL_BASED_OUTPATIENT_CLINIC_OR_DEPARTMENT_OTHER): Payer: Self-pay

## 2022-08-16 ENCOUNTER — Other Ambulatory Visit: Payer: Self-pay | Admitting: Cardiovascular Disease

## 2022-08-16 MED ORDER — MOUNJARO 5 MG/0.5ML ~~LOC~~ SOAJ
5.0000 mg | SUBCUTANEOUS | 0 refills | Status: AC
  Filled 2022-08-16: qty 2, 28d supply, fill #0

## 2022-08-17 ENCOUNTER — Telehealth: Payer: Self-pay | Admitting: Cardiovascular Disease

## 2022-08-17 MED ORDER — LOSARTAN POTASSIUM 100 MG PO TABS: ORAL_TABLET | ORAL | 1 refills | Status: DC

## 2022-08-17 MED ORDER — AMLODIPINE BESYLATE 10 MG PO TABS: ORAL_TABLET | ORAL | 1 refills | Status: DC

## 2022-08-17 NOTE — Telephone Encounter (Signed)
Pt has appointment May.  Refill for Losartan and [facility]mlod[facility]ine [facility]

## 2022-08-17 NOTE — Telephone Encounter (Signed)
*  STAT* If patient is at the pharmacy, call can be transferred to refill team.   1. Which medications need to be refilled? (please list name of each medication and dose if known)  amLODipine (NORVASC) 10 MG tablet  losartan (COZAAR) 100 MG tablet   2. Which pharmacy/location (including street and city if local pharmacy) is medication to be sent to? [id number] [place], [place] - [address] AT [address] [place]   3. Do they need a 30 day or 90 day supply? 90 day  Patient has an appointment 10/02/2022.

## 2022-09-07 ENCOUNTER — Other Ambulatory Visit (HOSPITAL_BASED_OUTPATIENT_CLINIC_OR_DEPARTMENT_OTHER): Payer: Self-pay

## 2022-09-07 MED ORDER — MOUNJARO 7.5 MG/0.5ML ~~LOC~~ SOAJ
7.5000 mg | SUBCUTANEOUS | 5 refills | Status: DC
  Filled 2022-09-07: qty 2, 28d supply, fill #0

## 2022-09-08 ENCOUNTER — Other Ambulatory Visit (HOSPITAL_BASED_OUTPATIENT_CLINIC_OR_DEPARTMENT_OTHER): Payer: Self-pay

## 2022-09-10 ENCOUNTER — Other Ambulatory Visit (HOSPITAL_BASED_OUTPATIENT_CLINIC_OR_DEPARTMENT_OTHER): Payer: Self-pay

## 2022-09-11 ENCOUNTER — Other Ambulatory Visit: Payer: Self-pay | Admitting: Cardiovascular Disease

## 2022-09-19 DIAGNOSIS — H401132 Primary open-angle glaucoma, bilateral, moderate stage: Secondary | ICD-10-CM | POA: Diagnosis not present

## 2022-10-02 ENCOUNTER — Ambulatory Visit: Payer: HMO | Attending: Cardiovascular Disease | Admitting: Cardiovascular Disease

## 2022-10-02 ENCOUNTER — Encounter: Payer: Self-pay | Admitting: Cardiovascular Disease

## 2022-10-02 DIAGNOSIS — Z9861 Coronary angioplasty status: Secondary | ICD-10-CM

## 2022-10-02 DIAGNOSIS — I480 Paroxysmal atrial fibrillation: Secondary | ICD-10-CM | POA: Diagnosis not present

## 2022-10-02 DIAGNOSIS — Z7985 Long-term (current) use of injectable non-insulin antidiabetic drugs: Secondary | ICD-10-CM

## 2022-10-02 DIAGNOSIS — E785 Hyperlipidemia, unspecified: Secondary | ICD-10-CM | POA: Diagnosis not present

## 2022-10-02 DIAGNOSIS — E119 Type 2 diabetes mellitus without complications: Secondary | ICD-10-CM

## 2022-10-02 DIAGNOSIS — I2510 Atherosclerotic heart disease of native coronary artery without angina pectoris: Secondary | ICD-10-CM

## 2022-10-02 DIAGNOSIS — Z951 Presence of aortocoronary bypass graft: Secondary | ICD-10-CM | POA: Diagnosis not present

## 2022-10-02 DIAGNOSIS — I10 Essential (primary) hypertension: Secondary | ICD-10-CM

## 2022-10-02 MED ORDER — LOSARTAN POTASSIUM 100 MG PO TABS
100.0000 mg | ORAL_TABLET | Freq: Every day | ORAL | 3 refills | Status: DC
  Filled 2023-06-17: qty 90, 90d supply, fill #0

## 2022-10-02 MED ORDER — AMLODIPINE BESYLATE 10 MG PO TABS
10.0000 mg | ORAL_TABLET | Freq: Every day | ORAL | 3 refills | Status: DC
  Filled 2023-06-17: qty 90, 90d supply, fill #0

## 2022-10-02 MED ORDER — EZETIMIBE 10 MG PO TABS
10.0000 mg | ORAL_TABLET | Freq: Every day | ORAL | 3 refills | Status: DC
  Filled 2023-06-17: qty 90, 90d supply, fill #0

## 2022-10-02 MED ORDER — METOPROLOL TARTRATE 25 MG PO TABS
25.0000 mg | ORAL_TABLET | Freq: Two times a day (BID) | ORAL | 3 refills | Status: DC
  Filled 2023-06-17: qty 180, 90d supply, fill #0

## 2022-10-02 MED ORDER — ATORVASTATIN CALCIUM 20 MG PO TABS
20.0000 mg | ORAL_TABLET | Freq: Every day | ORAL | 3 refills | Status: DC
  Filled 2023-06-17: qty 90, 90d supply, fill #0

## 2022-10-02 NOTE — Progress Notes (Unsigned)
Patient ID: Ryan Wagner, male   DOB: 01-25-1946, 76 y.o.   MRN: [id number]        Primary MD: Dr. Nolene Ebbs  HPI: Ryan Wagner is a 77 y.o. male whois a former patient of Dr. Julieanne Manson and last saw him in January 2010.  He establish cardiology care with me in February 2017.  He presents to the office today for a 26 month follow-up cardiology evaluation.  Ryan Wagner has a history of CAD and suffered an inferior wall myocardial infarction on 11/21/2002 and 3 drug-eluting stents were placed in the RCA and at that time.He was also found to have  moderate to severe disease in the obtuse marginal system.  He underwent repeat cardiac catheterization by Dr. Clarene Duke which revealed diffuse disease in the circumflex vessel not amenable to percutaneous coronary intervention which medical therapy was recommended.  A nuclear perfusion study was done in October 2007 showed fairly normal perfusion in all myocardial segments.  He initially had been on dual antiplatelet therapy, but his Plavix was stopped 2008.  When he last saw Dr. Clarene Duke in January 2010 he was without recurrent anginal symptoms.  He is followed by Dr. Johny Blamer for primary medical care.  Additional problems include type 2 diabetes mellitus, hyperlipidemia, hypertension, arthritis.  There is also a history of hiatal hernia with esophageal stricture.  He had undergone left knee replacement surgery.  He had seen Dr. Tiburcio Pea last in November 2016.  At that time he was on metoprolol, tartrate 50 Milgram's twice a day, Lotrel 10/20 daily for blood pressure control.  He was on atorvastatin 40 mg for hyperlipidemia.  He has been taking Victoza for his diabetes mellitus.  Laboratory done at that time revealed a hemoglobin of 15.6, hematocrit of 45.6.  Hemoglobin A1c was 5.9.  Lipid studies revealed a QTC 137, TG 1:30, HDL 32, LDL 78, and non-HDL 104.  He had normal renal function with a BUN of 12 and a creatinine of 0.81.  LFTs  were normal.  TSH was normal at 0.90.    He has been seen by Diona Fanti, PA-C and Bettina Gavia, [place].  He has an apple watch and this past summer he was to potential short periods of atrial fibrillation n which she was asymptomatic by his apple watch.  He wore an event monitor from August 21 through September 19.  The predominant rhythm was sinus rhythm with the fastest heart rate at 120 bpm.  These harborage heartbeat was 63 bpm.  He had rare PVCs with a burden of 3% and very rare PACs less than 1%.  There was one episode of nonsustained VT lasting 4 beats at a heart rate of 106.  He also was found to have several short-lived episodes of PAF with an AF burden of less than 1%.  His slowest heart rate was in atrial fibrillation at 32 bpm.  He was subsequently seen by Bettina Gavia all he was wearing the monitor.  He underwent an echo Doppler study in March 24, 2018 which showed normal systolic function with EF of 55 to 60%.  He did not have any regional wall motion abnormalities.  There was grade 1 diastolic dysfunction and mild dilation of his RV.  Over the past several months he states he has felt well but is watched detected an episode of AF on one occasion in November and then again in December which lasted for approximately 2 days.  On May 11, 2018 he called the  answering service and as noted in telephone message with Georgie Chard, NP, he was asymptomatic without chest pain shortness of breath or palpitations but he reported atrial fibrillation rates in the 50-100 range.  He was started on Eliquis 5 mg twice a day and was instructed to stop his aspirin.  The patient feels well.  He denies chest pain.    The patient was seen by Dr. Tyrone Sage and underwent bypass grafting x4 on June 11, 2018 with a LIMA to his LAD, sequential saphenous vein to his OM1 and OM 2, and saphenous vein graft to his PDA of his RCA.  He also underwent Maze procedure and atrial clip for his atrial fibrillation.  He did well postop, he  did have recurrent PAF the day prior to discharge and was kept an extra 24 hours and placed on amiodarone.     He was seen in the office 07/23/2018 and was doing well. He was in NSR.  He was hypertensive and his medications were adjusted (taken off ACE secondary to cough and placed on ARB).   His medications have also been adjusted twice by our pharmacist.  His losartan was increased to 100 mg daily and a few days later amlodipine 10 mg was added, he was previously on a combination of amlodipine and ACE inhibitor.    When last seen by Corine Shelter, he was feeling well.  Blood pressures were stable at home.  He denied any chest pain or palpitations.  He is no longer on amiodarone.   He was supposed to start back rehab in late March, but unfortunately this was deferred due to the COVID-19 pandemic.   I last evaluated him in a telemedicine visit on September 22, 2018.  At that time he was walking every day up to 30 minutes.  He denied chest pain PND orthopnea.  When he was discharged from the hospital he was discharged on atorvastatin 80 mg daily but apparently when he went to pick up his prescription there was a prior prescription of 40 mg  He continued to be on Victoza and metformin for his diabetes mellitus.  I last saw him in December 2020 and since his prior evaluation he had remained stable and denied recurrent anginal symptomatology, palpitations, PND orthopnea.  He continued to be on amlodipine 10 mg, losartan 100 mg, metoprolol tartrate 25 mg twice a day for hypertension.  He has not been aware of any recurrent episodes of atrial fibrillation and continues to be on Eliquis 5 mg twice a day.  He was tolerating atorvastatin 80 mg for hyperlipidemia.  He has had issues with right leg sciatica.  He has seen Dr. Gean Birchwood and is now on gabapentin.  He will need an MRI.  He has not been able to exercise due to his sciatica discomfort.   I last saw him on July 11, 2020. Since his prior evaluation, he has  undergone hip surgery.  He is now able to go on more prolonged walks.  He denies any chest pain.  He has continued to be on amlodipine 10 mg, losartan 100 mg daily, metoprolol 25 mg twice a day.  He apparently is now only on atorvastatin at 20 mg and takes coenzyme Q 10.  Apparently had developed some myalgias on the higher dose leading to dose reduction.  He is diabetic on Victoza and Metformin.  He presents for evaluation.  Past Medical History:  Diagnosis Date   Arthritis    knees, hands   Atrial  fibrillation (HCC)    CAD (coronary artery disease)    a. prior MI in 2004 with DESx3 to RCA   Complication of anesthesia    had difficulty voiding after hemorrhoidectomy   Diabetes mellitus without complication (HCC)    type 2   GERD (gastroesophageal reflux disease)    no longer with symptoms   Hyperlipidemia    Hypertension    Myocardial infarction [facility])    Sciatica    improved    Past Surgical History:  Procedure Laterality Date   ADENOIDECTOMY     ANTERIOR CERVICAL DECOMP/DISCECTOMY FUSION N/A 04/15/2018   Procedure: Cervical Four-Five Cervical Five-Six Anterior cervical decompression/discectomy/fusion;  Surgeon: Jadene Pierini, MD;  Location: [facility] OR;  Service: Neurosurgery;  Laterality: N/A;  anterior approach   APPENDECTOMY     BAND HEMORRHOIDECTOMY     CARDIAC CATHETERIZATION     CLIPPING OF ATRIAL APPENDAGE N/A 06/11/2018   Procedure: CLIPPING OF LEFT ATRIAL APPENDAGE using a 35 AtriCure Clip;  Surgeon: Delight Ovens, MD;  Location: [facility] OR;  Service: Open Heart Surgery;  Laterality: N/A;   COLONOSCOPY     CORONARY ANGIOPLASTY  2004   CORONARY ARTERY BYPASS GRAFT N/A 06/11/2018   Procedure: CORONARY ARTERY BYPASS GRAFTING (CABG) x4 using left internal mammary artery and right greater saphenous vein harvested endoscopically. LIMA to distal LAD, Seq SVG to 1st & 2nd OM, SVG to distal PD;  Surgeon: Delight Ovens, MD;  Location: [facility] OR;  Service: Open Heart Surgery;   Laterality: N/A;   ESOPHAGEAL DILATION     KNEE SURGERY Left    LEFT HEART CATH AND CORONARY ANGIOGRAPHY N/A 06/10/2018   Procedure: LEFT HEART CATH AND CORONARY ANGIOGRAPHY;  Surgeon: Lennette Bihari, MD;  Location: [facility] INVASIVE CV LAB;  Service: Cardiovascular;  Laterality: N/A;   LIPOMA EXCISION     neck   MAZE N/A 06/11/2018   Procedure: Pulmonary isolation MAZE using bipolar ablation;  Surgeon: Delight Ovens, MD;  Location: [facility] OR;  Service: Open Heart Surgery;  Laterality: N/A;   TEE WITHOUT CARDIOVERSION N/A 06/11/2018   Procedure: TRANSESOPHAGEAL ECHOCARDIOGRAM (TEE);  Surgeon: Delight Ovens, MD;  Location: [facility] OR;  Service: Open Heart Surgery;  Laterality: N/A;   TONSILLECTOMY     TOTAL HIP ARTHROPLASTY Right 02/25/2020   Procedure: TOTAL HIP ARTHROPLASTY ANTERIOR APPROACH;  Surgeon: Durene Romans, MD;  Location: [facility] ORS;  Service: Orthopedics;  Laterality: Right;  70 mins   TOTAL KNEE ARTHROPLASTY Left 06/01/2014   Procedure: TOTAL KNEE ARTHROPLASTY;  Surgeon: Velna Ochs, MD;  Location: [facility] OR;  Service: Orthopedics;  Laterality: Left;    Allergies  Allergen Reactions   Ace Inhibitors Cough    Current Outpatient Medications  Medication Sig Dispense Refill   amLODipine (NORVASC) 10 MG tablet TAKE 1 TABLET BY MOUTH EVERY DAY *NEED APPOINTMENT FOR REFILLS* 30 tablet 1   atorvastatin (LIPITOR) 20 MG tablet Take 20 mg by mouth daily.     Coenzyme Q10 (CO Q 10) 100 MG CAPS Take 100 mg by mouth daily.      esomeprazole (NEXIUM) 20 MG capsule Take 20 mg by mouth daily.      ezetimibe (ZETIA) 10 MG tablet TAKE ONE TABLET BY MOUTH ONCE DAILY 90 tablet 1   gabapentin (NEURONTIN) 300 MG capsule Take 300 mg by mouth at bedtime.     Krill Oil 500 MG CAPS Take 500 mg by mouth daily.     L-Arginine 1000 MG TABS Take 1,000  mg by mouth daily.      losartan (COZAAR) 100 MG tablet TAKE ONE TABLET BY MOUTH DAILY *SCHEDULE APPOINTMENT FOR FUTURE REFILLS 30 tablet 1   MAGNESIUM GLUCONATE PO  Take 150 mg by mouth daily.     Menaquinone-7 (VITAMIN K2 PO) Take 1 capsule by mouth daily.     metFORMIN (GLUCOPHAGE) 500 MG tablet Take 500 mg by mouth 2 (two) times daily with a meal.     methocarbamol (ROBAXIN) 500 MG tablet Take 1 tablet (500 mg total) by mouth every 6 (six) hours as needed for muscle spasms. 40 tablet 0   metoprolol tartrate (LOPRESSOR) 25 MG tablet TAKE ONE TABLET BY MOUTH TWICE DAILY 180 tablet 3   MOUNJARO 7.5 MG/0.5ML Pen Inject 7.5 mg into the skin once a week.     Multiple Vitamin (MULTIVITAMIN) tablet Take 1 tablet by mouth daily.     polyethylene glycol (MIRALAX / GLYCOLAX) 17 g packet Take 17 g by mouth 2 (two) times daily. 28 packet 0   Turmeric 500 MG CAPS Take 500 mg by mouth daily.     Vitamin D3 (VITAMIN D) 25 MCG tablet Take 2,000 Units by mouth daily.      Zinc 50 MG CAPS Take 50 mg by mouth daily.      docusate sodium (COLACE) 100 MG capsule Take 1 capsule (100 mg total) by mouth 2 (two) times daily. 28 capsule 0   latanoprost (XALATAN) 0.005 % ophthalmic solution Place 1 drop into both eyes at bedtime.     Liraglutide (VICTOZA) 18 MG/3ML SOLN injection Inject 1.2 mg into the skin daily.      tirzepatide [facility]) 5 MG/0.5ML Pen Inject 5 mg into the skin once a week. (Patient not taking: Reported on 10/02/2022) 2 mL 0   No current facility-administered medications for this visit.    Social History   Socioeconomic History   Marital status: Married    Spouse name: Not on file   Number of children: Not on file   Years of education: Not on file   Highest education level: Not on file  Occupational History   Not on file  Tobacco Use   Smoking status: Never   Smokeless tobacco: Never  Vaping Use   Vaping Use: Never used  Substance and Sexual Activity   Alcohol use: Yes    Comment: occasional   Drug use: No   Sexual activity: Not on file  Other Topics Concern   Not on file  Social History Narrative   Not on file   Social Determinants of  Health   Financial Resource Strain: Not on file  Food Insecurity: No Food Insecurity (10/28/2019)   Hunger Vital Sign    Worried About Running Out of Food in the Last Year: Never true    Ran Out of Food in the Last Year: Never true  Transportation Needs: No Transportation Needs (10/28/2019)   PRAPARE - Administrator, Civil Service (Medical): No    Lack of Transportation (Non-Medical): No  Physical Activity: Not on file  Stress: Not on file  Social Connections: Not on file  Intimate Partner Violence: Not on file   Additional social history is notable that he is married since 69.  He has 2 children 5 grandchildren.  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  He attended [employer] for college.  There is no tobacco.  He drinks occasional wine.  He does walk but not regularly.  Family  History  Problem Relation Age of Onset   Dementia Mother    Thyroid disease Mother    Dementia Father    High Cholesterol Father    Additional family history is notable that both parents died at age 47.  His father died of pneumonia.  He has one sister age 77, who is healthy.  His children are age 102 and 54.  ROS General: Negative; No fevers, chills, or night sweats HEENT: Negative; No changes in vision or hearing, sinus congestion, difficulty swallowing Pulmonary: Negative; No cough, wheezing, shortness of breath, hemoptysis Cardiovascular: See HPI GI: Negative; No nausea, vomiting, diarrhea, or abdominal pain GU: Negative; No dysuria, hematuria, or difficulty voiding Musculoskeletal: Status post hip surgery Hematologic: Negative; no easy bruising, bleeding Endocrine: Negative; no heat/cold intolerance; no diabetes, Neuro: Positive for right lower extremity sciatica symptoms Skin: Negative; No rashes or skin lesions Psychiatric: Negative; No behavioral problems, depression Sleep: Negative; No snoring,  daytime sleepiness, hypersomnolence, bruxism, restless legs, hypnogognic  hallucinations. Other comprehensive 14 point system review is negative   Physical Exam BP (!) 142/70 (BP Location: Left Arm, Patient Position: Sitting, Cuff Size: Large)   Pulse 60   Ht 5\' 7"  (1.702 m)   Wt 179 lb 6.4 oz (81.4 kg)   SpO2 96%   BMI 28.10 kg/m    Repeat blood pressure by me 128/64  Wt Readings from Last 3 Encounters:  10/02/22 179 lb 6.4 oz (81.4 kg)  07/11/20 198 lb 3.2 oz (89.9 kg)  02/25/20 200 lb 1 oz (90.7 kg)     Physical Exam BP (!) 142/70 (BP Location: Left Arm, Patient Position: Sitting, Cuff Size: Large)   Pulse 60   Ht 5\' 7"  (1.702 m)   Wt 179 lb 6.4 oz (81.4 kg)   SpO2 96%   BMI 28.10 kg/m  General: Alert, oriented, no distress.  Skin: normal turgor, no rashes, warm and dry HEENT: Normocephalic, atraumatic. Pupils equal round and reactive to light; sclera anicteric; extraocular muscles intact; Fundi ** Nose without nasal septal hypertrophy Mouth/Parynx benign; Mallinpatti scale Neck: No JVD, no carotid bruits; normal carotid upstroke Lungs: clear to ausculatation and percussion; no wheezing or rales Chest wall: without tenderness to palpitation Heart: PMI not displaced, RRR, s1 s2 normal, 1/6 systolic murmur, no diastolic murmur, no rubs, gallops, thrills, or heaves Abdomen: soft, nontender; no hepatosplenomehaly, BS+; abdominal aorta nontender and not dilated by palpation. Back: no CVA tenderness Pulses 2+ Musculoskeletal: full range of motion, normal strength, no joint deformities Extremities: no clubbing cyanosis or edema, Homan's sign negative  Neurologic: grossly nonfocal; Cranial nerves grossly wnl Psychologic: Normal mood and affect    General: Alert, oriented, no distress.  Skin: normal turgor, no rashes, warm and dry HEENT: Normocephalic, atraumatic. Pupils equal round and reactive to light; sclera anicteric; extraocular muscles intact;  Nose without nasal septal hypertrophy Mouth/Parynx benign; Mallinpatti scale 3 Neck: No JVD,  no carotid bruits; normal carotid upstroke Lungs: clear to ausculatation and percussion; no wheezing or rales Chest wall: without tenderness to palpitation Heart: PMI not displaced, RRR, s1 s2 normal, 1/6 systolic murmur, no diastolic murmur, no rubs, gallops, thrills, or heaves Abdomen: soft, nontender; no hepatosplenomehaly, BS+; abdominal aorta nontender and not dilated by palpation. Back: no CVA tenderness Pulses 2+ Musculoskeletal: full range of motion, normal strength, no joint deformities Extremities: no clubbing cyanosis or edema, Homan's sign negative  Neurologic: grossly nonfocal; Cranial nerves grossly wnl Psychologic: Normal mood and affect  Oct 02, 2022 ECG (independently read by  me): SInus rhythm with mild sinus arrythmia  July 11, 2020 ECG (independently read by me): Sinus bradycardia at 59, mild sinun arrythmia, 1st degree block PR 212  December 2020 ECG (independently read by me): Sinus bradycardia at 47 with first-degree AV block.  Nonspecific ST-T changes.  January 2020 ECG (independently read by me): Sinus bradycardia at 50 bpm; first-degree AV block.  Q wave in lead III with new inferior and anterolteral T wave abnormalities which are new since his last ECG of 01/29/2018.  February 2017 ECG (independently read by me): Sinus bradycardia 59 bpm.  First-degree AV block with a PR interval at 232 ms.  No significant ST-T changes.   LABS: I personally reviewed the laboratory done at [facility] by Dr Lacretia Nicks. Holley Bouche as noted above.     Latest Ref Rng & Units 02/18/2020   11:34 AM 08/13/2018   10:33 AM 06/15/2018    3:19 AM  BMP  Glucose 70 - 99 mg/dL 161  096  91   BUN 8 - 23 mg/dL 14  12  23    Creatinine 0.61 - 1.24 mg/dL 0.45  4.09  8.11   BUN/Creat Ratio 10 - 24  14    Sodium 135 - 145 mmol/L 139  139  136   Potassium 3.5 - 5.1 mmol/L 4.1  4.3  4.1   Chloride 98 - 111 mmol/L 106  102  103   CO2 22 - 32 mmol/L 25  21  23    Calcium 8.9 - 10.3 mg/dL 8.4  9.1  7.9          Latest Ref Rng & Units 06/10/2018    9:06 PM 06/05/2018   11:10 AM  Hepatic Function  Total Protein 6.5 - 8.1 g/dL 6.2  6.2   Albumin 3.5 - 5.0 g/dL 3.8  4.2   AST 15 - 41 U/L 19  15   ALT 0 - 44 U/L 26  24   Alk Phosphatase 38 - 126 U/L 71  89   Total Bilirubin 0.3 - 1.2 mg/dL 1.2  0.7        Latest Ref Rng & Units 02/18/2020   11:34 AM 06/14/2018    6:42 AM 06/13/2018    4:45 AM  CBC  WBC 4.0 - 10.5 K/uL 7.4  15.1  17.5   Hemoglobin 13.0 - 17.0 g/dL 91.4  78.2  9.9   Hematocrit 39.0 - 52.0 % 41.3  30.7  30.1   Platelets 150 - 400 K/uL 252  162  128    Lab Results  Component Value Date   MCV 83.1 02/18/2020   MCV 87.7 06/14/2018   MCV 89.3 06/13/2018    Lab Results  Component Value Date   TSH 0.997 06/05/2018    BNP No results found for: "BNP"  ProBNP No results found for: "PROBNP"   Lipid Panel  No results found for: "CHOL", "TRIG", "HDL", "CHOLHDL", "VLDL", "LDLCALC", "LDLDIRECT"   RADIOLOGY: No results found.  IMPRESSION:  No diagnosis found.   ASSESSMENT AND PLAN: Ryan Wagner is a 77 year old gentleman who suffered an inferior wall myocardial infarction in 2004 and was treated successfully with insertion of 3 stents in his RCA.  He had diffuse disease in his circumflex vessel.  A nuclear perfusion study in 2007 showed fairly normal perfusion.  He has a history of hypertension as well as diabetes mellitus.  He underwent CABG revascularization in June 11, 2018 with a LIMA to LAD, sequential SVG to OM1 and  OM 2, and SVG to PDA and RCA.  He also underwent a Maze procedure and atrial clip for his atrial fibrillation.  Presently, he has not had any anginal symptoms since his bypass surgery and is unaware of any recurrent episodes of atrial fibrillation.  His blood pressure today is stable on current therapy including amlodipine 10 mg, losartan 100 mg, and metoprolol 25 mg twice a day.  He tolerated right hip surgery by Dr. Durene Romans on  February 25, 2020 and is now able to walk without discomfort.  Since I last saw him his dose of atorvastatin apparently had been reduced to 20 mg.  I reviewed recent laboratory and I have recommended the addition of Zetia 10 mg to his atorvastatin in attempt to achieve an LDL less than 70 since this was in the 80s when checked by his primary physician.  In 3 months I will recheck a chemistry profile lipid studies and contact him regarding the results.  I will see him in 6 months for reevaluation   Lennette Bihari, MD, [facility]  10/02/2022 10:13 AM

## 2022-10-02 NOTE — Patient Instructions (Signed)
Medication Instructions:  The current medical regimen is effective;  continue present plan and medications.  *If you need a refill on your cardiac medications before your next appointment, please call your pharmacy*   Follow-Up: At [facility], you and your health needs are our priority.  As part of our continuing mission to provide you with exceptional heart care, we have created designated Provider Care Teams.  These Care Teams include your primary Cardiologist (physician) and Advanced Practice Providers (APPs -  Physician Assistants and Nurse Practitioners) who all work together to provide you with the care you need, when you need it.  We recommend signing up for the patient portal called "MyChart".  Sign up information is provided on this After Visit Summary.  MyChart is used to connect with patients for Virtual Visits (Telemedicine).  Patients are able to view lab/test results, encounter notes, upcoming appointments, etc.  Non-urgent messages can be sent to your provider as well.   To learn more about what you can do with MyChart, go to [email or web address].    Your next appointment:   12 month(s)  Provider:   Thomas Kelly, MD      

## 2022-10-04 ENCOUNTER — Encounter: Payer: Self-pay | Admitting: Cardiovascular Disease

## 2022-11-07 DIAGNOSIS — E1169 Type 2 diabetes mellitus with other specified complication: Secondary | ICD-10-CM | POA: Diagnosis not present

## 2022-11-07 DIAGNOSIS — E119 Type 2 diabetes mellitus without complications: Secondary | ICD-10-CM | POA: Diagnosis not present

## 2022-11-07 DIAGNOSIS — M179 Osteoarthritis of knee, unspecified: Secondary | ICD-10-CM | POA: Diagnosis not present

## 2022-11-07 DIAGNOSIS — E782 Mixed hyperlipidemia: Secondary | ICD-10-CM | POA: Diagnosis not present

## 2022-11-07 DIAGNOSIS — K219 Gastro-esophageal reflux disease without esophagitis: Secondary | ICD-10-CM | POA: Diagnosis not present

## 2022-11-07 DIAGNOSIS — I2510 Atherosclerotic heart disease of native coronary artery without angina pectoris: Secondary | ICD-10-CM | POA: Diagnosis not present

## 2022-11-07 DIAGNOSIS — I10 Essential (primary) hypertension: Secondary | ICD-10-CM | POA: Diagnosis not present

## 2023-01-04 ENCOUNTER — Other Ambulatory Visit (HOSPITAL_BASED_OUTPATIENT_CLINIC_OR_DEPARTMENT_OTHER): Payer: Self-pay

## 2023-01-04 MED ORDER — MOUNJARO 10 MG/0.5ML ~~LOC~~ SOAJ
10.0000 mg | SUBCUTANEOUS | 3 refills | Status: DC
  Filled 2023-01-04: qty 2, 28d supply, fill #0
  Filled 2023-02-08: qty 2, 28d supply, fill #1
  Filled 2023-03-25: qty 2, 28d supply, fill #2

## 2023-01-22 DIAGNOSIS — H401132 Primary open-angle glaucoma, bilateral, moderate stage: Secondary | ICD-10-CM | POA: Diagnosis not present

## 2023-02-08 ENCOUNTER — Other Ambulatory Visit (HOSPITAL_BASED_OUTPATIENT_CLINIC_OR_DEPARTMENT_OTHER): Payer: Self-pay

## 2023-02-13 ENCOUNTER — Other Ambulatory Visit (HOSPITAL_BASED_OUTPATIENT_CLINIC_OR_DEPARTMENT_OTHER): Payer: Self-pay

## 2023-03-25 ENCOUNTER — Other Ambulatory Visit (HOSPITAL_BASED_OUTPATIENT_CLINIC_OR_DEPARTMENT_OTHER): Payer: Self-pay

## 2023-03-29 ENCOUNTER — Other Ambulatory Visit (HOSPITAL_BASED_OUTPATIENT_CLINIC_OR_DEPARTMENT_OTHER): Payer: Self-pay

## 2023-05-09 ENCOUNTER — Other Ambulatory Visit (HOSPITAL_BASED_OUTPATIENT_CLINIC_OR_DEPARTMENT_OTHER): Payer: Self-pay

## 2023-05-09 DIAGNOSIS — H401112 Primary open-angle glaucoma, right eye, moderate stage: Secondary | ICD-10-CM | POA: Diagnosis not present

## 2023-05-09 MED ORDER — MOUNJARO 10 MG/0.5ML ~~LOC~~ SOAJ
10.0000 mg | SUBCUTANEOUS | 5 refills | Status: AC
  Filled 2023-05-09: qty 2, 28d supply, fill #0
  Filled 2023-06-14: qty 2, 28d supply, fill #1

## 2023-06-14 ENCOUNTER — Other Ambulatory Visit (HOSPITAL_BASED_OUTPATIENT_CLINIC_OR_DEPARTMENT_OTHER): Payer: Self-pay

## 2023-06-15 ENCOUNTER — Other Ambulatory Visit (HOSPITAL_BASED_OUTPATIENT_CLINIC_OR_DEPARTMENT_OTHER): Payer: Self-pay

## 2023-06-15 MED ORDER — METHOCARBAMOL 500 MG PO TABS
500.0000 mg | ORAL_TABLET | Freq: Two times a day (BID) | ORAL | 3 refills | Status: DC
  Filled 2023-07-23: qty 180, 90d supply, fill #0
  Filled 2023-10-26: qty 180, 90d supply, fill #1

## 2023-06-17 ENCOUNTER — Other Ambulatory Visit: Payer: Self-pay

## 2023-06-17 ENCOUNTER — Other Ambulatory Visit (HOSPITAL_BASED_OUTPATIENT_CLINIC_OR_DEPARTMENT_OTHER): Payer: Self-pay

## 2023-06-17 MED ORDER — GABAPENTIN 300 MG PO CAPS
300.0000 mg | ORAL_CAPSULE | Freq: Every day | ORAL | 2 refills | Status: AC
  Filled 2023-06-17: qty 30, 30d supply, fill #0

## 2023-06-17 MED ORDER — METFORMIN HCL 500 MG PO TABS
500.0000 mg | ORAL_TABLET | Freq: Two times a day (BID) | ORAL | 1 refills | Status: AC
  Filled 2023-09-17: qty 180, 90d supply, fill #0

## 2023-06-28 ENCOUNTER — Other Ambulatory Visit (HOSPITAL_BASED_OUTPATIENT_CLINIC_OR_DEPARTMENT_OTHER): Payer: Self-pay

## 2023-06-28 MED ORDER — MOUNJARO 7.5 MG/0.5ML ~~LOC~~ SOAJ
7.5000 mg | SUBCUTANEOUS | 5 refills | Status: DC
  Filled 2023-06-28: qty 2, 28d supply, fill #0
  Filled 2023-07-23: qty 2, 28d supply, fill #1
  Filled 2023-08-22: qty 2, 28d supply, fill #2
  Filled 2023-09-17: qty 2, 28d supply, fill #3
  Filled 2023-10-14: qty 2, 28d supply, fill #4
  Filled 2023-11-19: qty 2, 28d supply, fill #5

## 2023-07-23 ENCOUNTER — Other Ambulatory Visit (HOSPITAL_BASED_OUTPATIENT_CLINIC_OR_DEPARTMENT_OTHER): Payer: Self-pay

## 2023-07-24 ENCOUNTER — Other Ambulatory Visit (HOSPITAL_BASED_OUTPATIENT_CLINIC_OR_DEPARTMENT_OTHER): Payer: Self-pay

## 2023-09-09 DIAGNOSIS — E119 Type 2 diabetes mellitus without complications: Secondary | ICD-10-CM | POA: Diagnosis not present

## 2023-09-17 ENCOUNTER — Other Ambulatory Visit: Payer: Self-pay | Admitting: Cardiovascular Disease

## 2023-09-17 ENCOUNTER — Other Ambulatory Visit: Payer: Self-pay

## 2023-09-17 ENCOUNTER — Other Ambulatory Visit (HOSPITAL_BASED_OUTPATIENT_CLINIC_OR_DEPARTMENT_OTHER): Payer: Self-pay

## 2023-09-17 MED ORDER — AMLODIPINE BESYLATE 10 MG PO TABS
10.0000 mg | ORAL_TABLET | Freq: Every day | ORAL | 0 refills | Status: DC
  Filled 2023-09-17: qty 30, 30d supply, fill #0

## 2023-09-17 MED ORDER — LOSARTAN POTASSIUM 100 MG PO TABS
100.0000 mg | ORAL_TABLET | Freq: Every day | ORAL | 0 refills | Status: DC
  Filled 2023-09-17: qty 30, 30d supply, fill #0

## 2023-11-05 ENCOUNTER — Other Ambulatory Visit (HOSPITAL_BASED_OUTPATIENT_CLINIC_OR_DEPARTMENT_OTHER): Payer: Self-pay

## 2023-11-05 ENCOUNTER — Other Ambulatory Visit: Payer: Self-pay | Admitting: Cardiovascular Disease

## 2023-11-06 ENCOUNTER — Other Ambulatory Visit (HOSPITAL_BASED_OUTPATIENT_CLINIC_OR_DEPARTMENT_OTHER): Payer: Self-pay

## 2023-11-06 MED ORDER — AMLODIPINE BESYLATE 10 MG PO TABS
10.0000 mg | ORAL_TABLET | Freq: Every day | ORAL | 0 refills | Status: DC
  Filled 2023-11-06: qty 30, 30d supply, fill #0

## 2023-11-06 MED ORDER — LOSARTAN POTASSIUM 100 MG PO TABS
100.0000 mg | ORAL_TABLET | Freq: Every day | ORAL | 0 refills | Status: DC
  Filled 2023-11-06: qty 30, 30d supply, fill #0

## 2023-12-05 ENCOUNTER — Encounter (HOSPITAL_BASED_OUTPATIENT_CLINIC_OR_DEPARTMENT_OTHER): Payer: Self-pay | Admitting: Cardiovascular Disease

## 2023-12-05 ENCOUNTER — Other Ambulatory Visit: Payer: Self-pay | Admitting: Cardiovascular Disease

## 2023-12-06 ENCOUNTER — Other Ambulatory Visit (HOSPITAL_BASED_OUTPATIENT_CLINIC_OR_DEPARTMENT_OTHER): Payer: Self-pay

## 2023-12-06 MED ORDER — LOSARTAN POTASSIUM 100 MG PO TABS
100.0000 mg | ORAL_TABLET | Freq: Every day | ORAL | 0 refills | Status: AC
  Filled 2023-12-06 (×2): qty 15, 15d supply, fill #0

## 2023-12-06 MED ORDER — METOPROLOL TARTRATE 25 MG PO TABS
25.0000 mg | ORAL_TABLET | Freq: Two times a day (BID) | ORAL | 0 refills | Status: DC
  Filled 2023-12-06: qty 30, 15d supply, fill #0

## 2023-12-06 MED ORDER — AMLODIPINE BESYLATE 10 MG PO TABS
10.0000 mg | ORAL_TABLET | Freq: Every day | ORAL | 0 refills | Status: DC
  Filled 2023-12-06: qty 15, 15d supply, fill #0

## 2023-12-06 MED ORDER — ATORVASTATIN CALCIUM 20 MG PO TABS
20.0000 mg | ORAL_TABLET | Freq: Every day | ORAL | 0 refills | Status: DC
  Filled 2023-12-06: qty 15, 15d supply, fill #0

## 2023-12-06 MED ORDER — EZETIMIBE 10 MG PO TABS
10.0000 mg | ORAL_TABLET | Freq: Every day | ORAL | 0 refills | Status: DC
  Filled 2023-12-06: qty 15, 15d supply, fill #0

## 2023-12-24 ENCOUNTER — Other Ambulatory Visit (HOSPITAL_BASED_OUTPATIENT_CLINIC_OR_DEPARTMENT_OTHER): Payer: Self-pay

## 2023-12-24 ENCOUNTER — Other Ambulatory Visit: Payer: Self-pay

## 2023-12-24 DIAGNOSIS — K219 Gastro-esophageal reflux disease without esophagitis: Secondary | ICD-10-CM | POA: Diagnosis not present

## 2023-12-24 DIAGNOSIS — M179 Osteoarthritis of knee, unspecified: Secondary | ICD-10-CM | POA: Diagnosis not present

## 2023-12-24 DIAGNOSIS — E782 Mixed hyperlipidemia: Secondary | ICD-10-CM | POA: Diagnosis not present

## 2023-12-24 DIAGNOSIS — I2510 Atherosclerotic heart disease of native coronary artery without angina pectoris: Secondary | ICD-10-CM | POA: Diagnosis not present

## 2023-12-24 DIAGNOSIS — I10 Essential (primary) hypertension: Secondary | ICD-10-CM | POA: Diagnosis not present

## 2023-12-24 DIAGNOSIS — E1169 Type 2 diabetes mellitus with other specified complication: Secondary | ICD-10-CM | POA: Diagnosis not present

## 2023-12-24 MED ORDER — AMLODIPINE BESYLATE 10 MG PO TABS
10.0000 mg | ORAL_TABLET | Freq: Every day | ORAL | 3 refills | Status: AC
  Filled 2023-12-24: qty 90, 90d supply, fill #0
  Filled 2024-03-18: qty 90, 90d supply, fill #1
  Filled 2024-06-17: qty 90, 90d supply, fill #2

## 2023-12-24 MED ORDER — ATORVASTATIN CALCIUM 20 MG PO TABS
20.0000 mg | ORAL_TABLET | Freq: Every day | ORAL | 3 refills | Status: AC
  Filled 2023-12-24: qty 90, 90d supply, fill #0
  Filled 2024-03-18: qty 90, 90d supply, fill #1
  Filled 2024-06-17: qty 90, 90d supply, fill #2

## 2023-12-24 MED ORDER — LOSARTAN POTASSIUM 50 MG PO TABS
50.0000 mg | ORAL_TABLET | Freq: Every day | ORAL | 3 refills | Status: AC
  Filled 2023-12-24: qty 90, 90d supply, fill #0
  Filled 2024-03-18: qty 90, 90d supply, fill #1
  Filled 2024-06-17: qty 90, 90d supply, fill #2

## 2023-12-24 MED ORDER — EZETIMIBE 10 MG PO TABS
10.0000 mg | ORAL_TABLET | Freq: Every day | ORAL | 3 refills | Status: AC
  Filled 2023-12-24: qty 90, 90d supply, fill #0
  Filled 2024-03-18: qty 90, 90d supply, fill #1
  Filled 2024-06-17: qty 90, 90d supply, fill #2

## 2023-12-24 MED ORDER — MOUNJARO 7.5 MG/0.5ML ~~LOC~~ SOAJ
7.5000 mg | SUBCUTANEOUS | 11 refills | Status: AC
  Filled 2023-12-24: qty 2, 28d supply, fill #0
  Filled 2024-01-16: qty 2, 28d supply, fill #1
  Filled 2024-02-18: qty 2, 28d supply, fill #2
  Filled 2024-03-18: qty 2, 28d supply, fill #3
  Filled 2024-04-14: qty 2, 28d supply, fill #4
  Filled 2024-05-11: qty 2, 28d supply, fill #5
  Filled 2024-06-12: qty 2, 28d supply, fill #6

## 2023-12-24 MED ORDER — METOPROLOL TARTRATE 25 MG PO TABS
25.0000 mg | ORAL_TABLET | Freq: Two times a day (BID) | ORAL | 3 refills | Status: AC
  Filled 2023-12-24: qty 180, 90d supply, fill #0
  Filled 2024-03-18: qty 180, 90d supply, fill #1
  Filled 2024-06-17: qty 180, 90d supply, fill #2

## 2023-12-24 MED ORDER — METHOCARBAMOL 500 MG PO TABS
500.0000 mg | ORAL_TABLET | Freq: Two times a day (BID) | ORAL | 1 refills | Status: DC
  Filled 2023-12-24 – 2024-02-18 (×2): qty 90, 45d supply, fill #0
  Filled 2024-03-31: qty 90, 45d supply, fill #1

## 2024-02-18 ENCOUNTER — Other Ambulatory Visit (HOSPITAL_BASED_OUTPATIENT_CLINIC_OR_DEPARTMENT_OTHER): Payer: Self-pay

## 2024-02-27 ENCOUNTER — Other Ambulatory Visit: Payer: Self-pay | Admitting: Medical Genetics

## 2024-03-21 ENCOUNTER — Other Ambulatory Visit (HOSPITAL_BASED_OUTPATIENT_CLINIC_OR_DEPARTMENT_OTHER): Payer: Self-pay

## 2024-04-01 ENCOUNTER — Other Ambulatory Visit (HOSPITAL_BASED_OUTPATIENT_CLINIC_OR_DEPARTMENT_OTHER): Payer: Self-pay

## 2024-05-04 DIAGNOSIS — H6120 Impacted cerumen, unspecified ear: Secondary | ICD-10-CM | POA: Diagnosis not present

## 2024-05-06 ENCOUNTER — Other Ambulatory Visit

## 2024-05-06 DIAGNOSIS — Z006 Encounter for examination for normal comparison and control in clinical research program: Secondary | ICD-10-CM

## 2024-05-11 ENCOUNTER — Other Ambulatory Visit: Payer: Self-pay

## 2024-05-11 ENCOUNTER — Other Ambulatory Visit (HOSPITAL_BASED_OUTPATIENT_CLINIC_OR_DEPARTMENT_OTHER): Payer: Self-pay

## 2024-05-14 ENCOUNTER — Other Ambulatory Visit (HOSPITAL_BASED_OUTPATIENT_CLINIC_OR_DEPARTMENT_OTHER): Payer: Self-pay

## 2024-05-14 MED ORDER — METHOCARBAMOL 500 MG PO TABS
500.0000 mg | ORAL_TABLET | Freq: Two times a day (BID) | ORAL | 0 refills | Status: AC
  Filled 2024-05-14: qty 180, 90d supply, fill #0

## 2024-05-15 LAB — GENECONNECT MOLECULAR SCREEN: Genetic Analysis Overall Interpretation: NEGATIVE

## 2024-06-13 ENCOUNTER — Other Ambulatory Visit (HOSPITAL_BASED_OUTPATIENT_CLINIC_OR_DEPARTMENT_OTHER): Payer: Self-pay
